# Patient Record
Sex: Female | Born: 1950
Health system: Southern US, Community
[De-identification: ages and names within clinical notes are randomized; demographics above are authoritative.]

## PROBLEM LIST (undated history)

## (undated) DIAGNOSIS — E11319 Type 2 diabetes mellitus with unspecified diabetic retinopathy without macular edema: Secondary | ICD-10-CM

## (undated) DIAGNOSIS — S82899A Other fracture of unspecified lower leg, initial encounter for closed fracture: Secondary | ICD-10-CM

## (undated) DIAGNOSIS — H4312 Vitreous hemorrhage, left eye: Secondary | ICD-10-CM

## (undated) DIAGNOSIS — Z973 Presence of spectacles and contact lenses: Secondary | ICD-10-CM

## (undated) DIAGNOSIS — I10 Essential (primary) hypertension: Secondary | ICD-10-CM

## (undated) DIAGNOSIS — E039 Hypothyroidism, unspecified: Secondary | ICD-10-CM

## (undated) DIAGNOSIS — F17200 Nicotine dependence, unspecified, uncomplicated: Secondary | ICD-10-CM

## (undated) DIAGNOSIS — Z8249 Family history of ischemic heart disease and other diseases of the circulatory system: Secondary | ICD-10-CM

## (undated) DIAGNOSIS — M1990 Unspecified osteoarthritis, unspecified site: Secondary | ICD-10-CM

## (undated) DIAGNOSIS — K219 Gastro-esophageal reflux disease without esophagitis: Secondary | ICD-10-CM

## (undated) DIAGNOSIS — I493 Ventricular premature depolarization: Secondary | ICD-10-CM

## (undated) DIAGNOSIS — Z8489 Family history of other specified conditions: Secondary | ICD-10-CM

## (undated) DIAGNOSIS — T8484XA Pain due to internal orthopedic prosthetic devices, implants and grafts, initial encounter: Secondary | ICD-10-CM

## (undated) DIAGNOSIS — R55 Syncope and collapse: Secondary | ICD-10-CM

## (undated) DIAGNOSIS — H35039 Hypertensive retinopathy, unspecified eye: Secondary | ICD-10-CM

## (undated) HISTORY — PX: CATARACT EXTRACTION: SUR2

## (undated) HISTORY — DX: Hypertensive retinopathy, unspecified eye: H35.039

## (undated) HISTORY — DX: Family history of ischemic heart disease and other diseases of the circulatory system: Z82.49

## (undated) HISTORY — PX: CATARACT EXTRACTION W/ INTRAOCULAR LENS  IMPLANT, BILATERAL: SHX1307

## (undated) HISTORY — DX: Ventricular premature depolarization: I49.3

## (undated) HISTORY — DX: Type 2 diabetes mellitus with unspecified diabetic retinopathy without macular edema: E11.319

## (undated) HISTORY — DX: Unspecified osteoarthritis, unspecified site: M19.90

## (undated) HISTORY — DX: Essential (primary) hypertension: I10

## (undated) HISTORY — DX: Nicotine dependence, unspecified, uncomplicated: F17.200

## (undated) HISTORY — DX: Syncope and collapse: R55

## (undated) HISTORY — PX: EYE SURGERY: SHX253

---

## 1999-09-24 ENCOUNTER — Encounter: Admission: RE | Admit: 1999-09-24 | Discharge: 1999-09-24 | Payer: Self-pay | Admitting: Family Medicine

## 1999-09-24 ENCOUNTER — Encounter: Payer: Self-pay | Admitting: Family Medicine

## 1999-11-02 ENCOUNTER — Other Ambulatory Visit: Admission: RE | Admit: 1999-11-02 | Discharge: 1999-11-02 | Payer: Self-pay | Admitting: *Deleted

## 2003-10-24 ENCOUNTER — Encounter: Admission: RE | Admit: 2003-10-24 | Discharge: 2003-10-24 | Payer: Self-pay | Admitting: Internal Medicine

## 2003-12-30 ENCOUNTER — Encounter: Admission: RE | Admit: 2003-12-30 | Discharge: 2003-12-30 | Payer: Self-pay | Admitting: Internal Medicine

## 2004-01-30 ENCOUNTER — Encounter: Admission: RE | Admit: 2004-01-30 | Discharge: 2004-01-30 | Payer: Self-pay | Admitting: Internal Medicine

## 2004-02-03 ENCOUNTER — Encounter (INDEPENDENT_AMBULATORY_CARE_PROVIDER_SITE_OTHER): Payer: Self-pay | Admitting: Internal Medicine

## 2004-02-03 ENCOUNTER — Encounter: Admission: RE | Admit: 2004-02-03 | Discharge: 2004-02-03 | Payer: Self-pay | Admitting: Internal Medicine

## 2004-02-20 ENCOUNTER — Ambulatory Visit (HOSPITAL_COMMUNITY): Admission: RE | Admit: 2004-02-20 | Discharge: 2004-02-20 | Payer: Self-pay | Admitting: Internal Medicine

## 2004-02-20 ENCOUNTER — Encounter (INDEPENDENT_AMBULATORY_CARE_PROVIDER_SITE_OTHER): Payer: Self-pay | Admitting: Internal Medicine

## 2004-03-02 ENCOUNTER — Encounter: Admission: RE | Admit: 2004-03-02 | Discharge: 2004-03-02 | Payer: Self-pay | Admitting: Internal Medicine

## 2004-05-21 ENCOUNTER — Ambulatory Visit: Payer: Self-pay | Admitting: Internal Medicine

## 2004-07-12 LAB — CONVERTED CEMR LAB: Pap Smear: NORMAL

## 2004-08-18 ENCOUNTER — Ambulatory Visit: Payer: Self-pay | Admitting: Internal Medicine

## 2004-09-16 ENCOUNTER — Ambulatory Visit: Payer: Self-pay | Admitting: Internal Medicine

## 2005-01-11 ENCOUNTER — Ambulatory Visit: Payer: Self-pay | Admitting: Internal Medicine

## 2005-02-13 ENCOUNTER — Emergency Department (HOSPITAL_COMMUNITY): Admission: EM | Admit: 2005-02-13 | Discharge: 2005-02-13 | Payer: Self-pay | Admitting: Emergency Medicine

## 2005-06-16 ENCOUNTER — Ambulatory Visit: Payer: Self-pay | Admitting: Internal Medicine

## 2005-06-23 ENCOUNTER — Ambulatory Visit: Payer: Self-pay | Admitting: Internal Medicine

## 2005-07-07 ENCOUNTER — Emergency Department (HOSPITAL_COMMUNITY): Admission: EM | Admit: 2005-07-07 | Discharge: 2005-07-07 | Payer: Self-pay | Admitting: Family Medicine

## 2005-07-09 ENCOUNTER — Ambulatory Visit: Payer: Self-pay | Admitting: Internal Medicine

## 2005-07-12 HISTORY — PX: CARDIAC CATHETERIZATION: SHX172

## 2005-07-23 ENCOUNTER — Ambulatory Visit: Payer: Self-pay | Admitting: Internal Medicine

## 2005-08-20 ENCOUNTER — Ambulatory Visit: Payer: Self-pay | Admitting: Internal Medicine

## 2005-08-20 ENCOUNTER — Encounter (INDEPENDENT_AMBULATORY_CARE_PROVIDER_SITE_OTHER): Payer: Self-pay | Admitting: Internal Medicine

## 2005-09-22 ENCOUNTER — Emergency Department (HOSPITAL_COMMUNITY): Admission: EM | Admit: 2005-09-22 | Discharge: 2005-09-22 | Payer: Self-pay | Admitting: Emergency Medicine

## 2005-09-29 ENCOUNTER — Ambulatory Visit: Payer: Self-pay | Admitting: Hospitalist

## 2005-10-13 ENCOUNTER — Ambulatory Visit: Payer: Self-pay | Admitting: Hospitalist

## 2006-01-11 ENCOUNTER — Ambulatory Visit: Payer: Self-pay | Admitting: Hospitalist

## 2006-01-11 ENCOUNTER — Encounter: Admission: RE | Admit: 2006-01-11 | Discharge: 2006-01-11 | Payer: Self-pay | Admitting: Hospitalist

## 2006-01-11 ENCOUNTER — Inpatient Hospital Stay (HOSPITAL_COMMUNITY): Admission: AD | Admit: 2006-01-11 | Discharge: 2006-01-13 | Payer: Self-pay | Admitting: Internal Medicine

## 2006-01-11 ENCOUNTER — Encounter (INDEPENDENT_AMBULATORY_CARE_PROVIDER_SITE_OTHER): Payer: Self-pay | Admitting: Cardiology

## 2006-01-11 ENCOUNTER — Ambulatory Visit: Payer: Self-pay | Admitting: Internal Medicine

## 2006-01-13 ENCOUNTER — Encounter: Payer: Self-pay | Admitting: Vascular Surgery

## 2006-02-02 ENCOUNTER — Ambulatory Visit: Payer: Self-pay | Admitting: Hospitalist

## 2006-02-07 ENCOUNTER — Ambulatory Visit (HOSPITAL_COMMUNITY): Admission: RE | Admit: 2006-02-07 | Discharge: 2006-02-07 | Payer: Self-pay | Admitting: Internal Medicine

## 2006-02-17 ENCOUNTER — Ambulatory Visit: Payer: Self-pay | Admitting: Internal Medicine

## 2006-04-30 DIAGNOSIS — E119 Type 2 diabetes mellitus without complications: Secondary | ICD-10-CM | POA: Insufficient documentation

## 2006-04-30 DIAGNOSIS — I10 Essential (primary) hypertension: Secondary | ICD-10-CM

## 2006-04-30 DIAGNOSIS — G5600 Carpal tunnel syndrome, unspecified upper limb: Secondary | ICD-10-CM | POA: Insufficient documentation

## 2006-04-30 DIAGNOSIS — Z87891 Personal history of nicotine dependence: Secondary | ICD-10-CM | POA: Insufficient documentation

## 2006-08-25 ENCOUNTER — Ambulatory Visit: Payer: Self-pay | Admitting: Internal Medicine

## 2006-08-25 LAB — CONVERTED CEMR LAB
Blood Glucose, Fingerstick: 213
Hgb A1c MFr Bld: 8.3 %

## 2006-09-05 ENCOUNTER — Encounter (INDEPENDENT_AMBULATORY_CARE_PROVIDER_SITE_OTHER): Payer: Self-pay | Admitting: *Deleted

## 2006-09-05 LAB — CONVERTED CEMR LAB
Creatinine, Urine: 153.5 mg/dL
Microalb Creat Ratio: 16.4 mg/g (ref 0.0–30.0)
Microalb, Ur: 2.51 mg/dL — ABNORMAL HIGH (ref 0.00–1.89)

## 2006-09-07 ENCOUNTER — Ambulatory Visit: Payer: Self-pay | Admitting: Hospitalist

## 2006-09-07 ENCOUNTER — Ambulatory Visit: Payer: Self-pay | Admitting: Internal Medicine

## 2006-09-07 LAB — CONVERTED CEMR LAB: Blood Glucose, Fingerstick: 113

## 2006-12-20 ENCOUNTER — Ambulatory Visit: Payer: Self-pay | Admitting: Internal Medicine

## 2006-12-20 ENCOUNTER — Encounter (INDEPENDENT_AMBULATORY_CARE_PROVIDER_SITE_OTHER): Payer: Self-pay | Admitting: Internal Medicine

## 2006-12-20 LAB — CONVERTED CEMR LAB: Blood Glucose, Fingerstick: 204

## 2006-12-21 ENCOUNTER — Encounter (INDEPENDENT_AMBULATORY_CARE_PROVIDER_SITE_OTHER): Payer: Self-pay | Admitting: Internal Medicine

## 2006-12-21 LAB — CONVERTED CEMR LAB
Albumin: 4.7 g/dL (ref 3.5–5.2)
BUN: 22 mg/dL (ref 6–23)
Calcium: 9.7 mg/dL (ref 8.4–10.5)
Chloride: 102 meq/L (ref 96–112)
Glucose, Bld: 177 mg/dL — ABNORMAL HIGH (ref 70–99)
Hemoglobin: 16.1 g/dL — ABNORMAL HIGH (ref 12.0–15.0)
Potassium: 4.6 meq/L (ref 3.5–5.3)
RBC: 5.26 M/uL — ABNORMAL HIGH (ref 3.87–5.11)
Sodium: 138 meq/L (ref 135–145)
Total Protein: 7.4 g/dL (ref 6.0–8.3)
WBC: 6.3 10*3/uL (ref 4.0–10.5)

## 2007-01-02 ENCOUNTER — Ambulatory Visit (HOSPITAL_COMMUNITY): Admission: RE | Admit: 2007-01-02 | Discharge: 2007-01-02 | Payer: Self-pay | Admitting: Sports Medicine

## 2007-01-04 ENCOUNTER — Encounter: Admission: RE | Admit: 2007-01-04 | Discharge: 2007-01-04 | Payer: Self-pay | Admitting: Internal Medicine

## 2007-03-30 ENCOUNTER — Ambulatory Visit: Payer: Self-pay | Admitting: Infectious Diseases

## 2007-03-30 ENCOUNTER — Observation Stay (HOSPITAL_COMMUNITY): Admission: EM | Admit: 2007-03-30 | Discharge: 2007-03-30 | Payer: Self-pay | Admitting: Emergency Medicine

## 2007-04-07 ENCOUNTER — Ambulatory Visit: Payer: Self-pay | Admitting: Internal Medicine

## 2007-04-07 ENCOUNTER — Encounter (INDEPENDENT_AMBULATORY_CARE_PROVIDER_SITE_OTHER): Payer: Self-pay | Admitting: *Deleted

## 2007-04-07 LAB — CONVERTED CEMR LAB
Calcium: 9.9 mg/dL (ref 8.4–10.5)
Creatinine, Ser: 0.76 mg/dL (ref 0.40–1.20)
Glucose, Bld: 181 mg/dL — ABNORMAL HIGH (ref 70–99)
Hgb A1c MFr Bld: 7.7 %
Sodium: 140 meq/L (ref 135–145)

## 2007-04-12 ENCOUNTER — Ambulatory Visit: Payer: Self-pay | Admitting: Internal Medicine

## 2007-04-12 ENCOUNTER — Encounter (INDEPENDENT_AMBULATORY_CARE_PROVIDER_SITE_OTHER): Payer: Self-pay | Admitting: *Deleted

## 2007-04-12 LAB — CONVERTED CEMR LAB
CO2: 24 meq/L (ref 19–32)
Calcium: 9.6 mg/dL (ref 8.4–10.5)
Chloride: 103 meq/L (ref 96–112)
Creatinine, Ser: 0.8 mg/dL (ref 0.40–1.20)
Sodium: 138 meq/L (ref 135–145)

## 2007-06-02 ENCOUNTER — Ambulatory Visit: Payer: Self-pay | Admitting: Internal Medicine

## 2007-08-21 ENCOUNTER — Encounter (INDEPENDENT_AMBULATORY_CARE_PROVIDER_SITE_OTHER): Payer: Self-pay | Admitting: Internal Medicine

## 2007-11-01 ENCOUNTER — Encounter (INDEPENDENT_AMBULATORY_CARE_PROVIDER_SITE_OTHER): Payer: Self-pay | Admitting: Internal Medicine

## 2007-11-01 ENCOUNTER — Ambulatory Visit: Payer: Self-pay | Admitting: Hospitalist

## 2007-11-01 LAB — CONVERTED CEMR LAB
BUN: 15 mg/dL (ref 6–23)
Chloride: 101 meq/L (ref 96–112)
Potassium: 4.6 meq/L (ref 3.5–5.3)
Sodium: 137 meq/L (ref 135–145)

## 2007-12-22 ENCOUNTER — Encounter (INDEPENDENT_AMBULATORY_CARE_PROVIDER_SITE_OTHER): Payer: Self-pay | Admitting: Internal Medicine

## 2007-12-22 ENCOUNTER — Ambulatory Visit (HOSPITAL_COMMUNITY): Admission: RE | Admit: 2007-12-22 | Discharge: 2007-12-22 | Payer: Self-pay | Admitting: Internal Medicine

## 2007-12-22 ENCOUNTER — Ambulatory Visit: Payer: Self-pay | Admitting: Internal Medicine

## 2007-12-22 LAB — CONVERTED CEMR LAB
Blood Glucose, Fingerstick: 122
CO2: 28 meq/L (ref 19–32)
Calcium: 9.7 mg/dL (ref 8.4–10.5)
Chloride: 102 meq/L (ref 96–112)
Glucose, Bld: 150 mg/dL — ABNORMAL HIGH (ref 70–99)
Sodium: 140 meq/L (ref 135–145)

## 2008-01-03 ENCOUNTER — Ambulatory Visit (HOSPITAL_COMMUNITY): Admission: RE | Admit: 2008-01-03 | Discharge: 2008-01-03 | Payer: Self-pay | Admitting: Internal Medicine

## 2008-01-10 LAB — HM DIABETES EYE EXAM: HM Diabetic Eye Exam: NORMAL

## 2008-04-08 ENCOUNTER — Encounter: Payer: Self-pay | Admitting: Internal Medicine

## 2008-04-10 ENCOUNTER — Ambulatory Visit (HOSPITAL_COMMUNITY): Admission: RE | Admit: 2008-04-10 | Discharge: 2008-04-10 | Payer: Self-pay | Admitting: Orthopedic Surgery

## 2008-04-24 ENCOUNTER — Encounter: Admission: RE | Admit: 2008-04-24 | Discharge: 2008-06-26 | Payer: Self-pay | Admitting: Orthopedic Surgery

## 2008-08-06 ENCOUNTER — Ambulatory Visit: Payer: Self-pay | Admitting: Family Medicine

## 2008-08-06 DIAGNOSIS — M7500 Adhesive capsulitis of unspecified shoulder: Secondary | ICD-10-CM

## 2008-08-06 DIAGNOSIS — E785 Hyperlipidemia, unspecified: Secondary | ICD-10-CM

## 2008-08-06 LAB — HM COLONOSCOPY

## 2008-08-07 ENCOUNTER — Telehealth: Payer: Self-pay | Admitting: Family Medicine

## 2008-08-07 LAB — CONVERTED CEMR LAB
Creatinine,U: 174.6 mg/dL
Hgb A1c MFr Bld: 8.8 % — ABNORMAL HIGH (ref 4.6–6.0)
Microalb Creat Ratio: 11.5 mg/g (ref 0.0–30.0)
Microalb, Ur: 2 mg/dL — ABNORMAL HIGH (ref 0.0–1.9)

## 2008-08-09 ENCOUNTER — Other Ambulatory Visit: Admission: RE | Admit: 2008-08-09 | Discharge: 2008-08-09 | Payer: Self-pay | Admitting: Family Medicine

## 2008-08-09 ENCOUNTER — Encounter: Payer: Self-pay | Admitting: Family Medicine

## 2008-08-09 ENCOUNTER — Ambulatory Visit: Payer: Self-pay | Admitting: Family Medicine

## 2008-08-13 ENCOUNTER — Encounter (INDEPENDENT_AMBULATORY_CARE_PROVIDER_SITE_OTHER): Payer: Self-pay | Admitting: *Deleted

## 2008-09-11 ENCOUNTER — Ambulatory Visit: Payer: Self-pay | Admitting: Family Medicine

## 2008-09-11 ENCOUNTER — Encounter (INDEPENDENT_AMBULATORY_CARE_PROVIDER_SITE_OTHER): Payer: Self-pay | Admitting: *Deleted

## 2008-09-11 LAB — CONVERTED CEMR LAB: OCCULT 1: NEGATIVE

## 2008-09-16 IMAGING — MG MM DIGITAL SCREENING BILAT W/ CAD
4 series · 4 of 4 positions shown · non-contrast
Comparison: Prior studies.

DG SCREEN MAMMOGRAM BILATERAL
Bilateral CC and MLO view(s) were taken.
Technologist: Hiciano, Masako.(DI BU)(M)

DIGITAL SCREENING MAMMOGRAM WITH CAD:

[R CC]
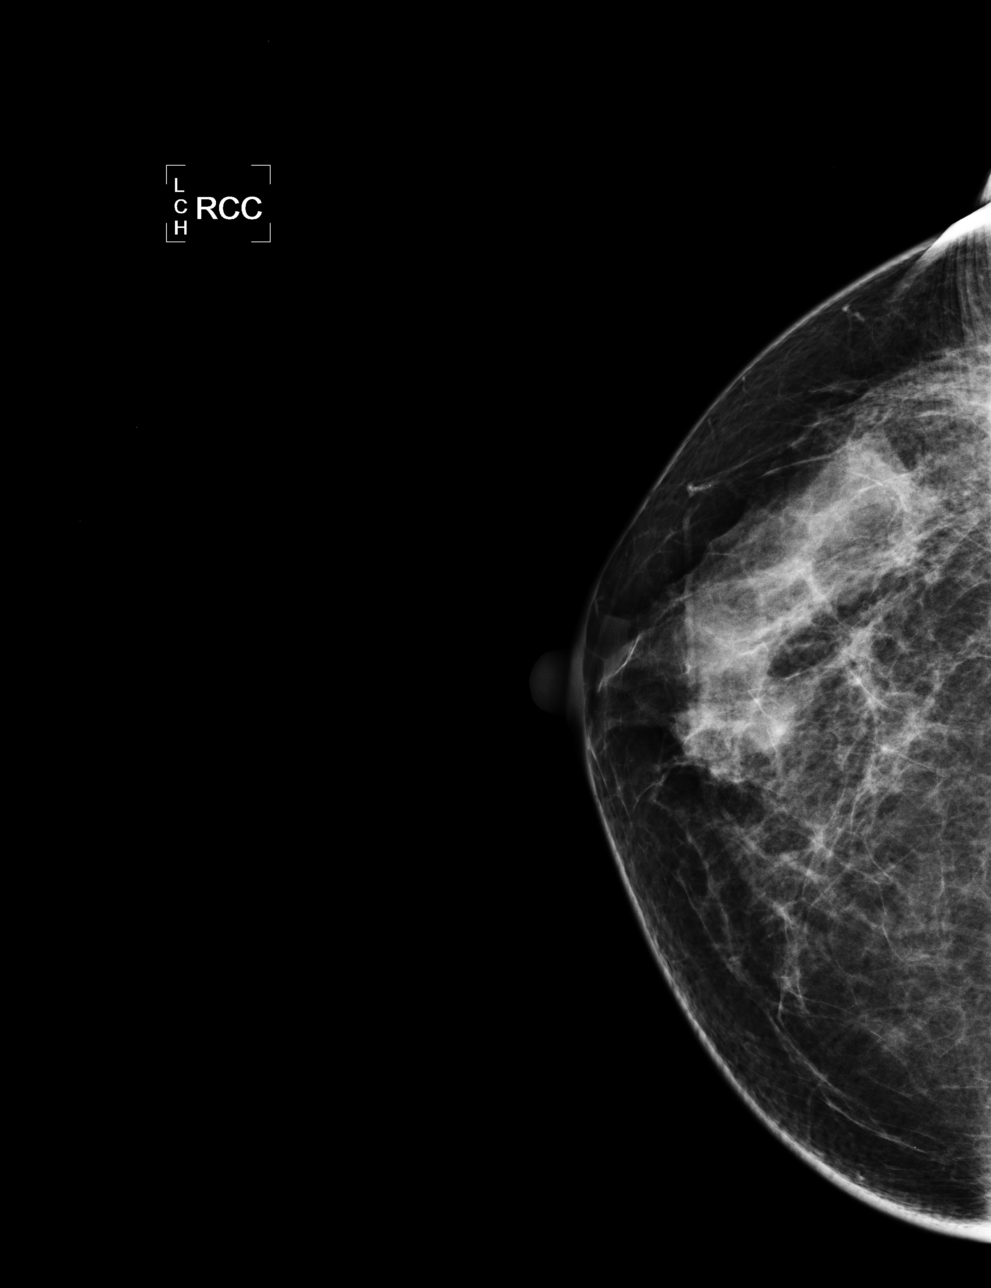

[R MLO]
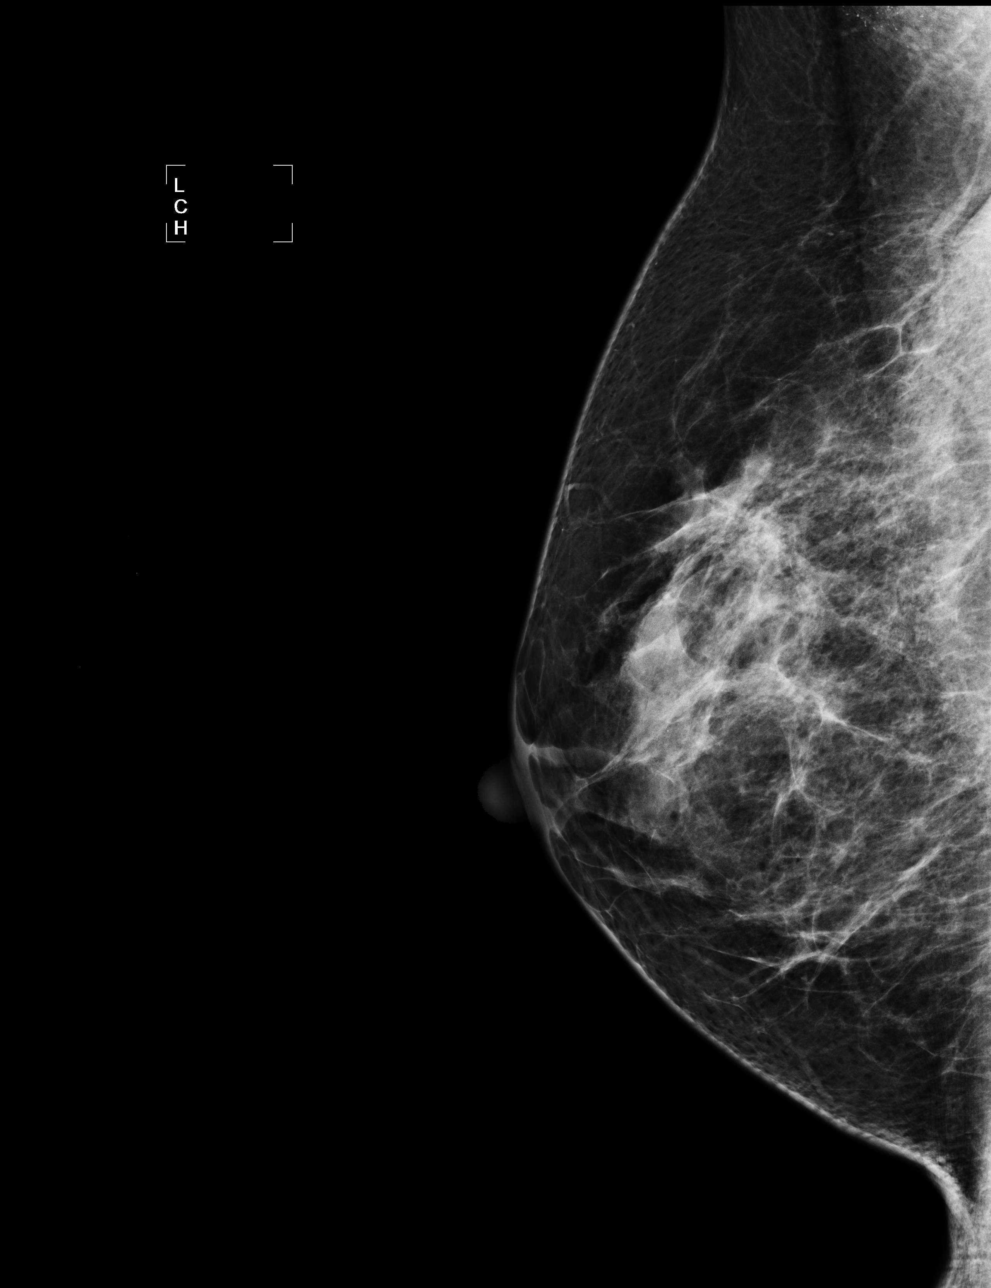

[L CC]
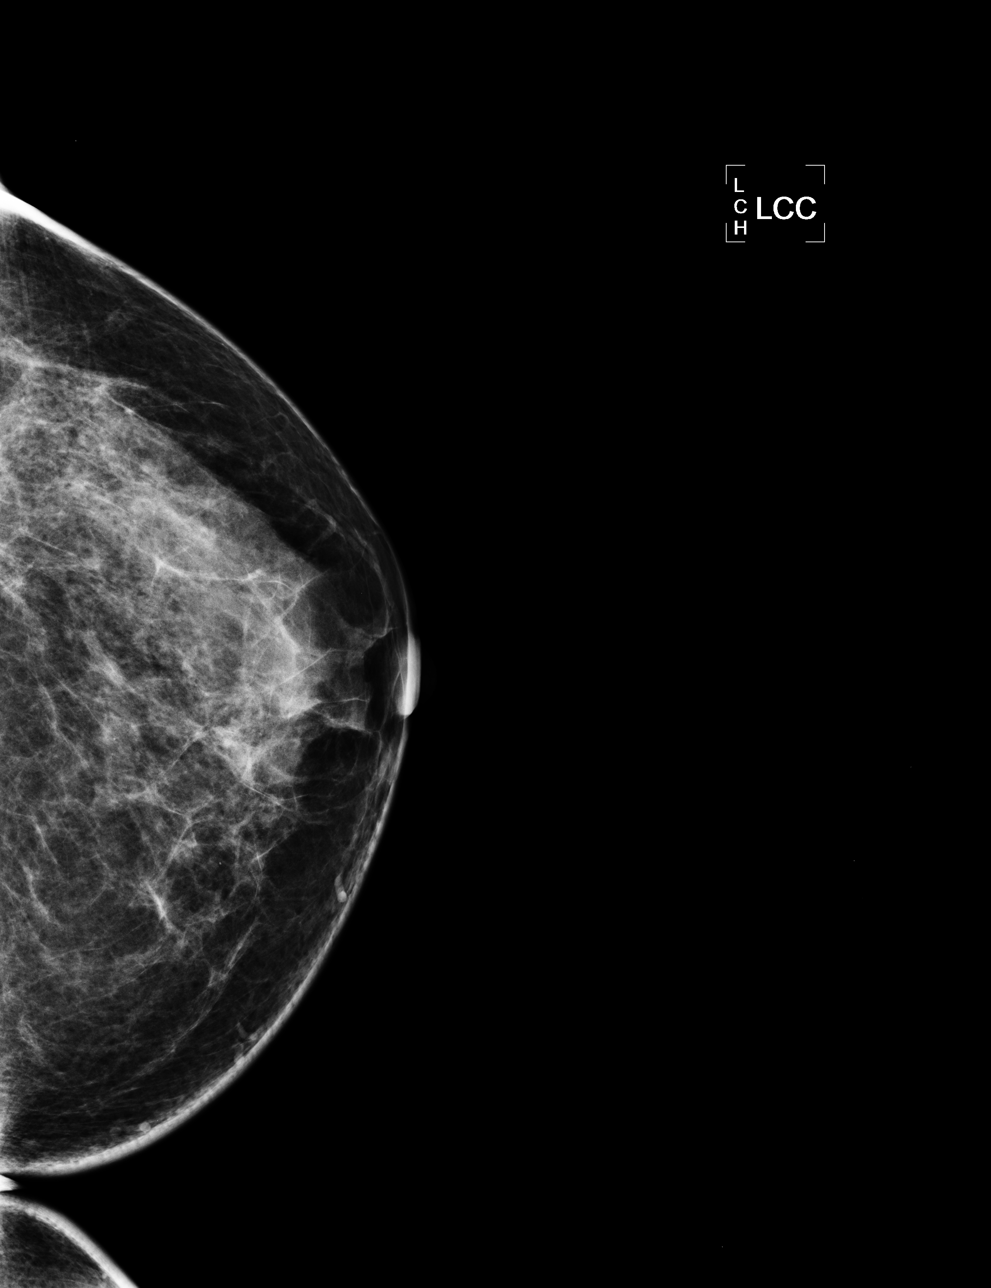

[L MLO]
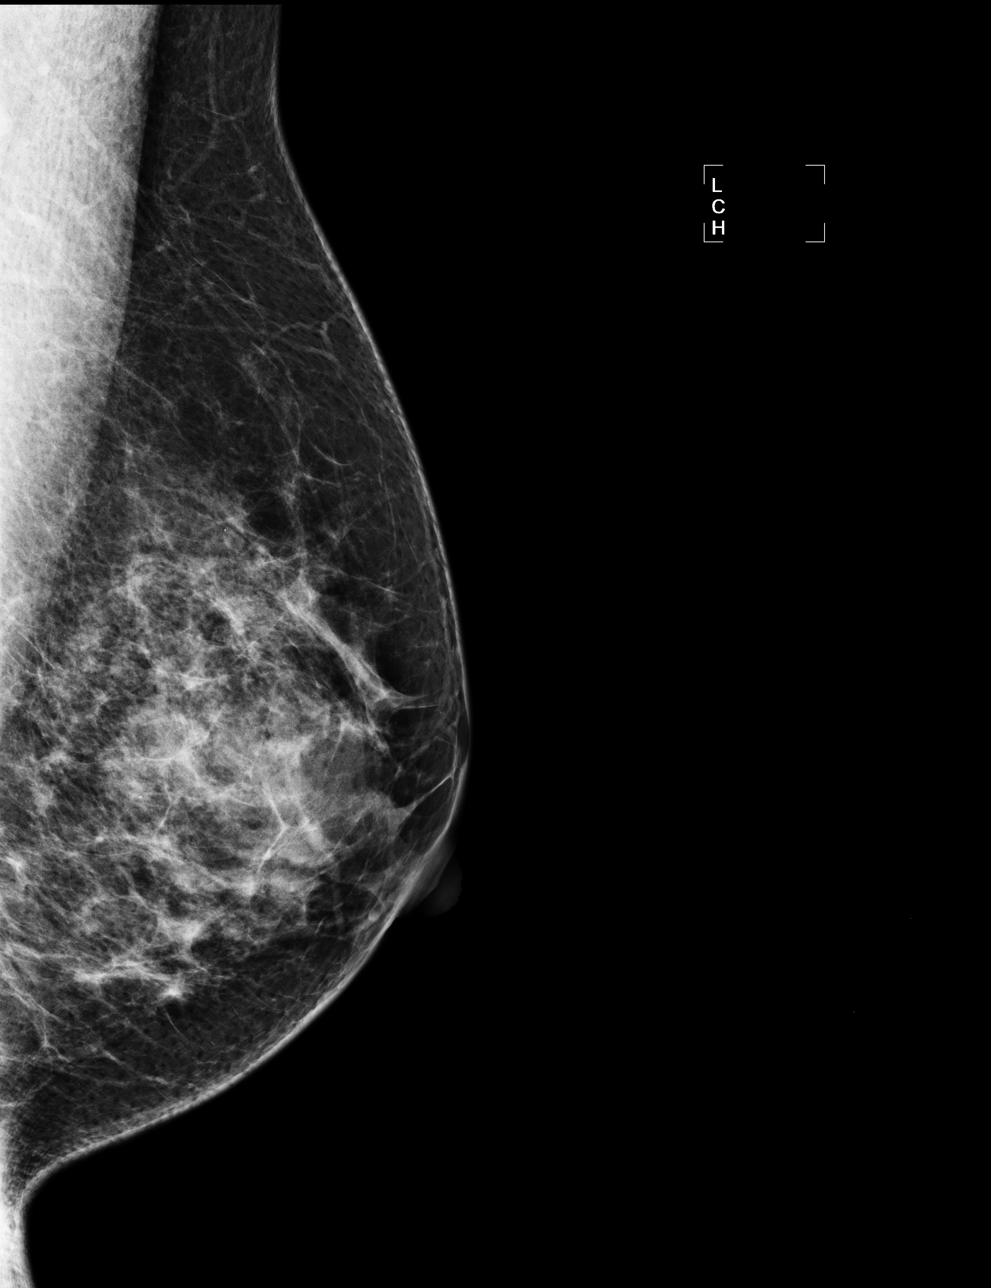

[4 of 4 positions shown; findings below may reference images not displayed]

The breast tissue is heterogeneously dense.  There is no dominant mass, architectural distortion or
calcification to suggest malignancy.
IMPRESSION: No mammographic evidence of malignancy.  Suggest yearly screening mammography.

ASSESSMENT: Negative - BI-RADS 1

Screening mammogram in 1 year.
ANALYZED BY COMPUTER AIDED DETECTION. , THIS PROCEDURE WAS A DIGITAL MAMMOGRAM.

## 2008-10-29 ENCOUNTER — Ambulatory Visit: Payer: Self-pay | Admitting: Family Medicine

## 2008-11-01 LAB — CONVERTED CEMR LAB
Albumin: 4.4 g/dL (ref 3.5–5.2)
Alkaline Phosphatase: 68 units/L (ref 39–117)
BUN: 15 mg/dL (ref 6–23)
CO2: 31 meq/L (ref 19–32)
Calcium: 9.8 mg/dL (ref 8.4–10.5)
Cholesterol: 101 mg/dL (ref 0–200)
Creatinine, Ser: 0.7 mg/dL (ref 0.4–1.2)
GFR calc non Af Amer: 91.19 mL/min (ref >60)
Glucose, Bld: 166 mg/dL — ABNORMAL HIGH (ref 70–99)
HDL: 34.1 mg/dL — ABNORMAL LOW (ref >39.00)
Hgb A1c MFr Bld: 7.9 % — ABNORMAL HIGH (ref 4.6–6.5)
Microalb, Ur: 1.7 mg/dL (ref 0.0–1.9)
Sodium: 143 meq/L (ref 135–145)
Total Protein: 7.5 g/dL (ref 6.0–8.3)
VLDL: 13.8 mg/dL (ref 0.0–40.0)

## 2008-11-05 ENCOUNTER — Ambulatory Visit: Payer: Self-pay | Admitting: Family Medicine

## 2008-11-05 LAB — CONVERTED CEMR LAB
Cholesterol, target level: 200 mg/dL
LDL Goal: 100 mg/dL

## 2009-01-30 ENCOUNTER — Ambulatory Visit: Payer: Self-pay | Admitting: Family Medicine

## 2009-02-03 LAB — CONVERTED CEMR LAB
BUN: 13 mg/dL (ref 6–23)
Calcium: 9.5 mg/dL (ref 8.4–10.5)
GFR calc non Af Amer: 91.11 mL/min (ref >60)
Glucose, Bld: 136 mg/dL — ABNORMAL HIGH (ref 70–99)
Hgb A1c MFr Bld: 8.4 % — ABNORMAL HIGH (ref 4.6–6.5)
Potassium: 4.6 meq/L (ref 3.5–5.1)
Sodium: 142 meq/L (ref 135–145)

## 2009-02-04 ENCOUNTER — Ambulatory Visit: Payer: Self-pay | Admitting: Family Medicine

## 2009-02-05 ENCOUNTER — Ambulatory Visit: Payer: Self-pay | Admitting: Internal Medicine

## 2009-02-05 ENCOUNTER — Encounter: Payer: Self-pay | Admitting: Internal Medicine

## 2009-02-05 LAB — CONVERTED CEMR LAB
Gardnerella vaginalis: NEGATIVE
Hemoglobin, Urine: NEGATIVE
Ketones, ur: NEGATIVE mg/dL
Protein, ur: NEGATIVE mg/dL
RBC / HPF: NONE SEEN (ref <3)
Urine Glucose: >1000 mg/dL — AB
pH: 5 (ref 5.0–8.0)

## 2009-02-06 ENCOUNTER — Encounter: Payer: Self-pay | Admitting: Internal Medicine

## 2009-02-24 ENCOUNTER — Telehealth: Payer: Self-pay | Admitting: *Deleted

## 2009-05-05 ENCOUNTER — Ambulatory Visit: Payer: Self-pay | Admitting: Family Medicine

## 2009-05-07 LAB — CONVERTED CEMR LAB
BUN: 14 mg/dL (ref 6–23)
CO2: 30 meq/L (ref 19–32)
Calcium: 10 mg/dL (ref 8.4–10.5)
Chloride: 104 meq/L (ref 96–112)
Creatinine, Ser: 0.7 mg/dL (ref 0.4–1.2)
GFR calc non Af Amer: 91.03 mL/min (ref >60)
Glucose, Bld: 128 mg/dL — ABNORMAL HIGH (ref 70–99)
Hgb A1c MFr Bld: 7.8 % — ABNORMAL HIGH (ref 4.6–6.5)
Potassium: 4.2 meq/L (ref 3.5–5.1)
Sodium: 144 meq/L (ref 135–145)

## 2009-05-08 ENCOUNTER — Ambulatory Visit: Payer: Self-pay | Admitting: Family Medicine

## 2009-05-30 ENCOUNTER — Telehealth: Payer: Self-pay | Admitting: Family Medicine

## 2009-06-04 ENCOUNTER — Encounter: Payer: Self-pay | Admitting: Family Medicine

## 2009-07-30 ENCOUNTER — Encounter (INDEPENDENT_AMBULATORY_CARE_PROVIDER_SITE_OTHER): Payer: Self-pay | Admitting: *Deleted

## 2009-08-29 ENCOUNTER — Ambulatory Visit: Payer: Self-pay | Admitting: Family Medicine

## 2009-08-29 LAB — CONVERTED CEMR LAB: Hgb A1c MFr Bld: 7.8 % — ABNORMAL HIGH (ref 4.6–6.5)

## 2009-11-07 ENCOUNTER — Ambulatory Visit: Payer: Self-pay | Admitting: Family Medicine

## 2009-11-07 LAB — HM DIABETES FOOT EXAM

## 2009-11-24 ENCOUNTER — Telehealth: Payer: Self-pay | Admitting: Family Medicine

## 2010-01-28 ENCOUNTER — Encounter: Payer: Self-pay | Admitting: Family Medicine

## 2010-04-01 ENCOUNTER — Encounter (INDEPENDENT_AMBULATORY_CARE_PROVIDER_SITE_OTHER): Payer: Self-pay | Admitting: *Deleted

## 2010-04-08 ENCOUNTER — Ambulatory Visit: Payer: Self-pay | Admitting: Family Medicine

## 2010-08-11 NOTE — Letter (Signed)
Summary: [place] No Show Letter  [place] at [facility]  [address] [place], [place] [address]   Phone: [phone]  Fax: [phone]    04/01/2010 MRN: [id number]  Jamie Mays [address] [place], [place]  [address]   Dear Ms. Yetta Barre,   Our records indicate that you missed your scheduled appointment with ______lab______________ on __9.21.11__________.  Please contact this office to reschedule your appointment as soon as possible.  It is important that you keep your scheduled appointments with your physician, so we can provide you the best care possible.  Please be advised that there may be a charge for "no show" appointments.    Sincerely,    at [facility]

## 2010-08-11 NOTE — Letter (Signed)
Summary: Med Link  Med Link   Imported By: Lanelle Bal 02/04/2010 08:39:01  _____________________________________________________________________  External Attachment:    Type:   Image     Comment:   External Document

## 2010-08-11 NOTE — Assessment & Plan Note (Signed)
Summary: CHECK UP / LFW   Vital Signs:  Patient profile:   60 year old female Height:      66 inches Weight:      166.8 pounds BMI:     27.02 Temp:     98.4 degrees F oral Pulse rate:   76 / minute Pulse rhythm:   regular BP sitting:   120 / 60  (left arm) Cuff size:   regular  Vitals Entered By: Benny Lennert CMA Duncan Dull) (November 07, 2009 2:29 PM)  History of Present Illness: Chief complaint 3 month follow up  DM, on insulin pump..remains inadequately controlled.  FBS: 120-240 post prandials 175-190. Jamison Neighbor, clinical nutritionist and DM educator.Marland Kitchenadjusting the pump. Remains on metformin...no side effects of this medicaiton.  Hypertension History:      Well controlled at homeOcc high under stress at work 155/85.        Positive major cardiovascular risk factors include female age 28 years old or older, diabetes, hyperlipidemia, and hypertension.  Negative major cardiovascular risk factors include non-tobacco-user status.     Problems Prior to Update: 1)  Special Screening Malig Neoplasms Other Sites  (ICD-V76.49) 2)  Routine Gynecological Examination  (ICD-V72.31) 3)  Well Woman  (ICD-V70.0) 4)  Hyperlipidemia  (ICD-272.4) 5)  Family History of Cad Female 1st Degree Relative <60  (ICD-V16.49) 6)  Frozen Right Shoulder  (ICD-726.0) 7)  Health Maintenance Exam  (ICD-V70.0) 8)  Hx of Medication Intolerance To Avandia  () 9)  Fh of Myocardial Infarction  () 10)  Tobacco Use, Quit  (ICD-V15.82) 11)  Diabetes Mellitus  (ICD-250.00) 12)  Carpal Tunnel Syndrome  (ICD-354.0) 13)  Hypertension  (ICD-401.9)  Allergies: 1)  ! * Lisinopril  Past History:  Past medical, surgical, family and social histories (including risk factors) reviewed, and no changes noted (except as noted below).  Past Medical History: Reviewed history from 08/06/2008 and no changes required. Hypertension DM Tobacco abuse Carpal tunnel syndrome Family Hx of MI Syncope PVCs  Past  Surgical History: Reviewed history from 08/06/2008 and no changes required. cath 2007 neg ECHO EF nml 2007 1981 C-section  Family History: Reviewed history from 08/06/2008 and no changes required. father: healthy mother: DM, CAD age 66, kidney failure Family History of CAD Female 1st degree relative <60 siblings: DM PGF" Hodgkin's dz  Social History: Reviewed history from 08/06/2008 and no changes required. Occupation: [employer] Married Former Smoker 10 pack year history Alcohol use-yes, rare Drug use-no Regular exercise-yes Diet: fruits and veggies, but doesn't stick to DM diet  Review of Systems General:  Denies fatigue and fever. CV:  Denies chest pain or discomfort. Resp:  Denies shortness of breath. GI:  Denies abdominal pain, bloody stools, constipation, and diarrhea. GU:  Denies dysuria.  Physical Exam  General:  Well-developed,well-nourished,in no acute distress; alert,appropriate and cooperative throughout examination Mouth:  MMM Neck:  no carotid bruit or thyromegaly no cervical or supraclavicular lymphadenopathy  Lungs:  Normal respiratory effort, chest expands symmetrically. Lungs are clear to auscultation, no crackles or wheezes. Heart:  Normal rate and regular rhythm. S1 and S2 normal without gallop, murmur, click, rub or other extra sounds. Abdomen:  Bowel sounds positive,abdomen soft and non-tender without masses, organomegaly or hernias noted. Pulses:  R and L posterior tibial pulses are full and equal bilaterally  Extremities:  no edmea  Diabetes Management Exam:    Foot Exam (with socks and/or shoes not present):       Sensory-Pinprick/Light touch:  Left medial foot (L-4): normal          Left dorsal foot (L-5): normal          Left lateral foot (S-1): normal          Right medial foot (L-4): normal          Right dorsal foot (L-5): normal          Right lateral foot (S-1): normal       Sensory-Monofilament:           Left foot: normal          Right foot: normal       Inspection:          Left foot: normal          Right foot: normal       Nails:          Left foot: normal          Right foot: normal   Impression & Recommendations:  Problem # 1:  DIABETES MELLITUS (ICD-250.00) Inadequate control..max pout metformin at 2 tabs two times a day. Recehck A1C in 3 months. Continue adjustment of insulin pump per specialist. Her updated medication list for this problem includes:    Metformin Hcl 500 Mg Tabs (Metformin hcl) .Marland Kitchen... Take 2 tablet by mouth two times a day    Cozaar 100 Mg Tabs (Losartan potassium) .Marland Kitchen... Take 1 tablet by mouth once a day generic    Aspirin 81 Mg Tabs (Aspirin) .Marland Kitchen... Take 1 tablet by mouth once a day    Novolog 100 Unit/ml Soln (Insulin aspart) .Marland Kitchen... As needed if insulin pump fails  Problem # 2:  HYPERTENSION (ICD-401.9)  Moderate control..only high if under stress at work Continue current medication.  If persistantly elevated at work..can consider increasing HCTZ to 25 mg daily.  Her updated medication list for this problem includes:    Cozaar 100 Mg Tabs (Losartan potassium) .Marland Kitchen... Take 1 tablet by mouth once a day generic    Hydrochlorothiazide 12.5 Mg Tabs (Hydrochlorothiazide) .Marland Kitchen... Take 1 tablet by mouth once a day  BP today: 120/60 Prior BP: 150/80 (05/08/2009)  Prior 10 Yr Risk Heart Disease: 24 % (02/04/2009)  Labs Reviewed: K+: 4.2 (05/05/2009) Creat: : 0.7 (05/05/2009)   Chol: 101 (10/29/2008)   HDL: 34.10 (10/29/2008)   LDL: 53 (10/29/2008)   TG: 69.0 (10/29/2008)  Complete Medication List: 1)  Metformin Hcl 500 Mg Tabs (Metformin hcl) .... Take 2 tablet by mouth two times a day 2)  Cozaar 100 Mg Tabs (Losartan potassium) .... Take 1 tablet by mouth once a day generic 3)  Antivert 25 Mg Tabs (Meclizine hcl) .... Take one tablet three times a day. 4)  Hydrochlorothiazide 12.5 Mg Tabs (Hydrochlorothiazide) .... Take 1 tablet by mouth once a day 5)   Multivitamins Tabs (Multiple vitamin) .... Take 1 tablet by mouth once a day 6)  Aspirin 81 Mg Tabs (Aspirin) .... Take 1 tablet by mouth once a day 7)  Calcium 600/vitamin D 600-400 Mg-unit Tabs (Calcium carbonate-vitamin d) .Marland Kitchen.. 1 tab by mouth daily 8)  Accu-chek Aviva Strp (Glucose blood) .... Check blood sugar 6 times daily  dx 250.01 9)  Lancets Misc (Lancets) .... Check blood sugar 3 times daily dx 250.00 10)  Novolog 100 Unit/ml Soln (Insulin aspart) .... As needed if insulin pump fails  Hypertension Assessment/Plan:      The patient's hypertensive risk group is category C: Target organ damage and/or diabetes.  Her calculated 10 year risk of coronary heart disease is 24 %.  Today's blood pressure is 120/60.  Her blood pressure goal is < 130/80.  Patient Instructions: 1)  INcrease metformin to 2 tab by mouth two times a day. 2)  Scheduled CPX in 3 months with labs prior. 3)  Fasting lipids, CMET, A1C, microalbumin Dx 250.00, cbc, TSH, vit B12 Dx 780.79 Prescriptions: METFORMIN HCL 500 MG TABS (METFORMIN HCL) Take 2 tablet by mouth two times a day  #120 x 11   Entered and Authorized by:   Kerby Nora MD   Signed by:   Kerby Nora MD on 11/07/2009   Method used:   Electronically to        [facility]* (retail)       [address]       [address] Shipping/mailing       [place], [place]  [address]       Ph: [phone]       Fax: [phone]   RxID:   [id number] NOVOLOG 100 UNIT/ML SOLN (INSULIN ASPART) as needed if insulin pump fails  #1 bottle x 11   Entered and Authorized by:   Kerby Nora MD   Signed by:   Kerby Nora MD on 11/07/2009   Method used:   Electronically to        [facility]* (retail)       [address]       [address] Shipping/mailing       [place], [place]  [address]       Ph: [phone]       Fax: [phone]   RxID:   [id number]   Current Allergies (reviewed today): ! * LISINOPRIL

## 2010-08-11 NOTE — Letter (Signed)
Summary: [place] No Show Letter  [place] at [facility]  [address] [place], [place] [address]   Phone: [phone]  Fax: [phone]    07/30/2009 MRN: [id number]  Jamie Mays [address] [place], [place]  [address]   Dear [name],   Our records indicate that you missed your scheduled appointment with ___lab__________________ on _1.19.11___________.  Please contact this office to reschedule your appointment as soon as possible.  It is important that you keep your scheduled appointments with your physician, so we can provide you the best care possible.  Please be advised that there may be a charge for "no show" appointments.    Sincerely,   [place] at [facility]

## 2010-08-11 NOTE — Progress Notes (Signed)
Summary: refill request for lantus  Phone Note Refill Request Message from:  [employer]  Refills Requested: Medication #1:  lantus Electronic refill request from [facility].  Chart says pt is no longer on lantus, is on novolog.  Please advise.  Initial call taken by: Lowella Petties CMA,  Nov 24, 2009 11:11 AM  Follow-up for Phone Call        cAll pt does she need this for her insulin pump? I believe answer is no.  Follow-up by: Kerby Nora MD,  Nov 24, 2009 11:25 AM  Additional Follow-up for Phone Call Additional follow up Details #1::        Called pt, no answer or machine.           Lowella Petties CMA  Nov 24, 2009 11:42 AM     Additional Follow-up for Phone Call Additional follow up Details #2::    patient has not returned phone call and drug store has not requested medication any more will try again if any more fax come through Follow-up by: Benny Lennert CMA Duncan Dull),  Nov 27, 2009 8:31 AM

## 2010-09-11 ENCOUNTER — Other Ambulatory Visit (HOSPITAL_COMMUNITY): Payer: Self-pay | Admitting: Gastroenterology

## 2010-09-16 ENCOUNTER — Ambulatory Visit (HOSPITAL_COMMUNITY)
Admission: RE | Admit: 2010-09-16 | Discharge: 2010-09-16 | Disposition: A | Discharge status: Home / Self Care | Payer: 59 | Source: Ambulatory Visit | Attending: Gastroenterology | Admitting: Gastroenterology

## 2010-09-16 DIAGNOSIS — K7689 Other specified diseases of liver: Secondary | ICD-10-CM | POA: Insufficient documentation

## 2010-09-29 ENCOUNTER — Other Ambulatory Visit: Payer: Self-pay | Admitting: Dermatology

## 2010-11-16 ENCOUNTER — Other Ambulatory Visit: Payer: Self-pay | Admitting: *Deleted

## 2010-11-16 MED ORDER — INSULIN ASPART PROT & ASPART (70-30 MIX) 100 UNIT/ML ~~LOC~~ SUSP: SUBCUTANEOUS | Status: DC

## 2010-11-27 NOTE — Discharge Summary (Signed)
Jamie Mays, Jamie Mays                ACCOUNT NO.:  [id number]   MEDICAL RECORD NO.:  [id number]          PATIENT TYPE:  OBV   LOCATION:  5705                         FACILITY:  [facility]   PHYSICIAN:  Fransisco Hertz, M.D.  DATE OF BIRTH:  September 12, 1950   DATE OF ADMISSION:  03/29/2007  DATE OF DISCHARGE:  03/30/2007                               DISCHARGE SUMMARY   ATTENDING:  Fransisco Hertz, M.D.   DISCHARGE DIAGNOSES:  1. Hypoglycemia due to accidental overdose of subcutaneous insulin.  2. Diabetes type 2 with last hemoglobin A1c of 7.4 on June 2008.  3. Hypertension.  4. History of carpal tunnel syndrome.   DISCHARGE MEDICATIONS:  1. Lantus 65 units q.h.s.  2. NovoLog sliding-scale insulin 3 times a daily.  3. HCTZ 12.5 mg p.o. daily.  4. Metformin 500 mg p.o. twice daily.  5. Cozaar 50 mg p.o. daily.   DISPOSITION/FOLLOWUP:  The patient will follow up at the outpatient  clinic of internal medicine at [facility].  To followup with her primary  care doctor who is Dr. Peggye Pitt.  She will call [phone] to make  this appointment and will follow up at her own discretion in the next 2  to 3 weeks.   PROCEDURE PERFORMED:  None.   CONSULTATIONS:  None.   BRIEF HISTORY:  This is a 60 year old female with a history of type 2  diabetes and hypertension who accidentally gave herself 65 units of  NovoLog instead of her nightly 65 units of Lantus this p.m.  She  realized immediately and came to the ED.  On the way to the ED she began  to feel sweaty, lightheaded, started to eat candy with hopes of  resolving her symptoms.  On presentation her CBG was 268.  She had no  other complaints at the time of discharge.   PHYSICAL EXAMINATION:  VITAL SIGNS:  On admission temperature 98.7,  blood pressure 134/78, pulse 105, respirations 18, saturating 96% on  room air.  CBG 268 which fell to 234.  GENERAL:  The patient is alert, pleasant in no active distress.  HEENT:  Pupils equally  reactive, nonicteric sclerae.  Extraocular eye  movements are intact.  Oropharynx exam there are no exudates, erythema  or swelling.  Oropharynx.  NECK:  There is no cervical lymphadenopathy found.  RESPIRATORY:  Lungs are clear bilaterally with good air movement.  CARDIOVASCULAR:  Regular rate and rhythm.  No murmurs, rubs or gallops.  ABDOMEN:  Bowel sounds present.  Abdomen is soft, nontender,  nondistended.  EXTREMITIES:  No edema.  No cyanosis or clubbing. Muscle strength +5  muscle strength in all extremities.  NEUROLOGIC:  The patient's cranial nerves II-XII are intact. There are  no focal deficits detected.  PSYCH:  The patient has a euthymic mood.   LABORATORY DATA:  Sodium 138, potassium 3.6, chloride 103, bicarb 27,  BUN 18, creatinine 0.7, glucose 146, white count 7.8, hemoglobin 15.4,  platelets 268, AST 87.9, calcium 9.7.   HOSPITAL COURSE:  1. Accidental overdose with 65 units of NovoLog instead of Lantus.  The patient mistakenly gave herself the wrong type of insulin      tonight.  She recognized her mistake; during the transit from her      home to the hospital, began to feel symptoms apparently symptoms of      hypoglycemia.  She began to feel shaky, weak like and lightheaded      and immediately began to consume candy.  Her blood glucoses on      admission actually indicated hyperglycemia with CBGs of 260.  The      patient was admitted for 23-hour observation just to be sure that      her blood glucoses remained stable overnight.  The patient did not      have any complaints overnight.  She did not have any shakiness,      clamminess or weakness.  Her blood glucoses maintained between 217      to 430.  The next morning we felt  comfortable sending the patient      home given that her blood glucoses had not fallen.  We placed the      patient back on her normal course of sliding-scale insulin and      Lantus.  She will follow up with her primary care physician  at her      own discretion at the outpatient clinic of internal medicine at      [facility].  2. Hypertension.  The patient's blood pressures were maintained      between 130/70 over 139/73 during the course of her admission.  She      is currently taking HCTZ and it appears to be well controlled.  We      do not feel that she needs any additional antihypertensives at this      time.  Her goal systolic blood pressure is under 130 and if she      continues to be higher than 130 at multiple visits with her primary      care doctor she might need additional antihypertensive therapy.   Discharge vitas:  Temperature 98.3, systolic blood pressure 130/70,  pulse 69, respirations 20, saturating 97 to 98% on room air.  CBGs 217  to 430.   Discharge labs:  Sodium 132, potassium 5.1, chloride 106, bicarb 28, BUN  12, creatinine 0.70, glucose 254.      Lollie Sails, MD       Fransisco Hertz, M.D.     CB/MEDQ  D:  04/25/2007  T:  04/26/2007  Job:  [id number]

## 2010-11-27 NOTE — Cardiovascular Report (Signed)
NAMEELSEY, HOLTS                ACCOUNT NO.:  [id number]   MEDICAL RECORD NO.:  [id number]          PATIENT TYPE:  INP   LOCATION:  3732                         FACILITY:  [facility]   PHYSICIAN:  Cristy Hilts. Jacinto Halim, MD       DATE OF BIRTH:  1951-04-12   DATE OF PROCEDURE:  01/13/2006  DATE OF DISCHARGE:                              CARDIAC CATHETERIZATION   DATE OF PROCEDURE:  01/13/2006.   PROCEDURE:  1.  Left ventriculography.  2.  Selective right and left coronary angiography.  3.  Abdominal aortogram.  4.  Right femoral angiography and closure of right femoral arterial access      with StarClose.   INDICATIONS FOR PROCEDURE:  Jamie Mays is a 60 year old female with a  history of diabetes, hypertension, who was admitted to the hospital with two  episodes of syncope.  She also has bradycardia.  She does have significant  cardiovascular risk factors.  Given this, she was brought to the cardiac  catheterization lab for definitive diagnosis of coronary disease.   Abdominal aortogram was performed to evaluate for abdominal atherosclerosis  and aortic stenosis.   HEMODYNAMIC DATA:  The left ventricular pressure of 131/2 with  end-  diastolic pressure of 11 mmHg.  The aortic pressures were 130/70 with a mean  of 98 mmHg.  There was no pressure gradient across the aortic valve.   ANGIOGRAPHIC DATA:  Left ventricle:  Left ventricular systolic function was  normal with ejection fraction of 65%.  There was no significant mitral  regurgitation.   Right coronary artery:  The right coronary artery is a moderate to large  caliber vessel.  It is tortuous, but otherwise continues as a large PDA.  It  is codominant with  circumflex pulmonary artery.   Left main:  Left main is very short and immediately bifurcates into LAD and  circumflex.   Circumflex:  Circumflex is large caliber vessel.  It continues as a large  OM1 after giving a distal AV groove branch and also small PDA branches.  It  is smooth and normal.   LAD:  LAD is a large caliber vessel.  Gives origin to several small  diagonals.  It is mildly tortuous.  Otherwise, it is normal.   Abdominal aortogram:  Abdominal aortogram revealed presence of 2 renal  arteries, one on either side.  .  They are widely patent.   FINDINGS:  1.  The patient with normal left ventricular systolic function, ejection      fraction of 65%.  2.  Normal coronary arteries, right and left core dominant systems.  3.  Normal abdominal aorta and normal renal arteries.   RECOMMENDATIONS:  The patient can potentially be discharged home if the  echocardiogram is normal unless she has recurrent syncope.  No further  evaluation is indicated.  However, if she does have recurrent symptoms of  syncope, consider a tilt table testing.   A total of 85 mL of contrast was utilized for diagnostic angiography.   TECHNIQUE OF PROCEDURE:  .  Under the usual sterile precautions, using a 6  French right femoral arterial access, 6 [place] multipurpose B2 catheter was  advanced into the ascending aorta over 0.025 inch J wire.  The catheter was  gently advanced into the left ventricle and left ventricular pressure  monitored.  Hand contrast injection of left ventricle was performed, both in  LAO and RAO projections.  Catheter was flushed with saline, pulled back into  the ascending aorta and pressure gradient across the aorta valve was  monitored.  Right coronary artery was selectively engaged and angiography  was performed.  Then left main coronary artery selectively engaged and  angiography was performed.  Then the catheter was pulled in the abdominal  aorta and abdominal aortogram was performed.  Right femoral angiography was  performed through the arterial access sheath and the access was closed with  StarClose with excellent hemostasis.  The patient tolerated the procedure  well.  No immediate complications noted.      Cristy Hilts. Jacinto Halim, MD   Electronically Signed     JRG/MEDQ  D:  01/13/2006  T:  01/13/2006  Job:  [id number]   cc:   Madaline Guthrie, M.D.  Fax: [phone]   Thereasa Solo, M.D.  Fax: [phone]

## 2010-11-27 NOTE — Discharge Summary (Signed)
Jamie Mays, Jamie Mays                ACCOUNT NO.:  [id number]   MEDICAL RECORD NO.:  [id number]          PATIENT TYPE:  INP   LOCATION:  3732                         FACILITY:  [facility]   PHYSICIAN:  Thereasa Solo, M.D.  DATE OF BIRTH:  08-01-1950   DATE OF ADMISSION:  01/11/2006  DATE OF DISCHARGE:  01/13/2006                                 DISCHARGE SUMMARY   DISCHARGE DIAGNOSES:  Main complaint 2 near syncopal episodes.   OTHER DIAGNOSES:  1.  Chronic-diabetes type 1 insulin dependent.  2.  Hypertension.   DISCHARGE MEDICATIONS:  1.  Paxil 20 mg p.o. once a day, preferably at bedtime.  2.  Lantus 65 units before bed which the patient was taking before she      arrived as well as NovoLog sliding scale insulin by the patient's home      chart.   CONDITION ON DISCHARGE:  Guarded.  The patient will be contacted by staff  here at [facility] for a followup date.  The patient usually follows up with  primary care physician Dr. Guy Begin, however she will be absent for the next 3  months.   PROCEDURES WHILE IN THE HOSPITAL:  1.  Cardiac echo.  2.  Carotid duplex.  3.  Cardiac catheterization.   CONSULTATIONS:  The patient was consulted by the cardiology staff here  including Dr. Elsie Lincoln.   BRIEF ADMITTING HISTORY AND PHYSICAL:  The patient is a 60 year old white  female who presented to the outpatient clinic with complaints of  lightheadedness or near syncopal events x2 over the past 2 weeks.  She  described the episodes as a feeling of lightheadedness or feeling as if she  was about to faint.  The patient denies any palpitations, diaphoresis, or  vertigo.  The patient also denies chest pain and shortness of breath and  loss of consciousness.  During these episodes which lasted around 20  minutes, the patient was able to check her blood sugar which remained indeed  140s approximately.  She did try to calculate her heart rate which she  believes was around 45 during the episodes.  She  also describes feeling  lethargic.   ADMISSION LABS:  CBC: White blood cell count 5.4, hemoglobin 15.4,  hematocrit 44.8, platelets 209.  Sodium 141, potassium 3.8, chloride 105,  CO2 28, BUN 12, creatinine 0.7, glucose 179.  AST 49, ALT 68, alk phos 79,  total bilirubin 1.1, albumin 4.2, calcium 9.4.  Troponin 0.01, CK-MB 1.4,  TSH 0.83, PT 14, INR 1.1, PTT 28.   HOSPITAL COURSE:  The patient's main problem was the 2 near syncopal events  that she had over the last 2 weeks, each lasting up to 20 minutes.  First we  considered cardiac causes including possible MI, arrhythmia, or transient  bradycardia.  For cardiac workup, the patient was placed on a regular bed  with telemetry.  She was scheduled for echocardiogram, the results of which  were normal.  Cardiac enzymes were normal and repeat EKGs were all normal.  The patient was also sent for carotid duplex which returned normal  with no  evidence of plaque.  The patient subsequently after cardiology consultation  was taken for cardiac catheterization the results of which were normal.  Also for her near syncopal events, we considered the fact that she is a type  1 diabetic.  However, during both of the episodes, the patient was able to  check her blood sugars which remained between 140 and 200.  Therefore  excluding the cause of hypoglycemia.  We also considered the fact that the  patient did have a history of panic attacks in the past however she has not  had them for several years.  The autonomic functions during the panic  attacks and subsequent vasovagal rebound could have produced similar  symptoms to the patient.  We also considered the fact that the patient could  have a type of orthostatic hypotension.  However, orthostatic blood  pressures were within normal limits.  It should be noted that on subsequent  followup visits, if the patient is still having these near syncopal events  that it could help to better diagnose the patient  if she was given a tilt  tablet test.  We also considered the fact that the patient did have a  history of hypertension which she is not taking any medications for.  However, during the patient's entire stay, her blood pressure was within  normal limits and we decided to discharge her without any hypertensive  medication.   Also during the patient's stay it was noted that she elevated liver  transaminases.  Subsequently, we sent away for a hepatitis B and hepatitis C  panel, the results of which are pending at the time of discharge.   Also during the patient's stay, she complained of a minor sinus fullness or  congestion.  For this we prescribed an Afrin nasal spray which did help  relieve the sinus congestion.  We opted not to go for a systemic  decongestant because this could alter her heart rhythm which we were  studying.   In summary, at the time of discharge, the patient had been largely ruled out  for any cardiac cause during her syncopal events.  We decided that they may  be due to a neuro cardiac effect in relation to the patient's possible  anxiety.   DISCHARGE LABS AND VITALS:  Temperature 98.4, blood pressure 132/68, pulse  79, respirations 14.  Oxygen saturation 96.  CBG 153.  White blood cell  count 5.0.  Hemoglobin 15.8, hematocrit 44.4, platelets 196, sodium 140,  potassium 4.1, chloride 104, CO2 30, BUN 12, creatinine 0.8, glucose 121.  PT 14.1, INR 1.1, PTT 30.  AST 51, ALT 68.  Alk phos 72, total bilirubin  1.2, albumin 4.0, calcium 9.2.   PENDING LABS:  Hepatitis B core antigen pending, hepatitis C antibody  pending.      Thereasa Solo, M.D.  Electronically Signed     AS/MEDQ  D:  01/13/2006  T:  01/13/2006  Job:  [id number]   cc:   Madaline Savage, M.D.  Fax: [phone]

## 2011-01-08 ENCOUNTER — Other Ambulatory Visit: Payer: Self-pay | Admitting: Family Medicine

## 2011-01-08 NOTE — Telephone Encounter (Signed)
Only gave patient # 30 with 0 refills called patient and left message on machine for her to return my call to schedule physical before any other refill

## 2011-01-11 NOTE — Telephone Encounter (Signed)
Patient advised and patient scheduled appt for cpx later this month

## 2011-01-22 ENCOUNTER — Encounter: Payer: Self-pay | Admitting: Family Medicine

## 2011-01-22 ENCOUNTER — Ambulatory Visit (INDEPENDENT_AMBULATORY_CARE_PROVIDER_SITE_OTHER): Payer: 59 | Admitting: Family Medicine

## 2011-01-22 VITALS — BP 152/78 | HR 80 | Ht 66.5 in | Wt 165.0 lb

## 2011-01-22 DIAGNOSIS — M7500 Adhesive capsulitis of unspecified shoulder: Secondary | ICD-10-CM

## 2011-01-22 DIAGNOSIS — M25552 Pain in left hip: Secondary | ICD-10-CM | POA: Insufficient documentation

## 2011-01-22 DIAGNOSIS — M25559 Pain in unspecified hip: Secondary | ICD-10-CM

## 2011-01-22 MED ORDER — NAPROXEN 500 MG PO TABS: 500.0000 mg | ORAL_TABLET | Freq: Two times a day (BID) | ORAL | Status: DC

## 2011-01-22 NOTE — Progress Notes (Signed)
  Subjective:    Patient ID: Jamie Mays, female    DOB: 06/29/1951, 60 y.o.   MRN: [id number]  HPI  #1. Left shoulder pain for many months. Similar to the symptoms she had with previously diagnosed right frozen shoulder. She is right-hand dominant. Unclear why this has been bothering her as there's been no specific injury. She is having difficulty raising her hand much above shoulder level and coverage behind her back.  #2. Left hip and knee pain that has been gradual in onset. Worse when she's doing a lot of walking.  Review of Systems     Objective:   Physical Exam    GENERAL: Well-developed female no acute distress Shoulder without obvious defect. Full strength in:              Internal rotation              External rotation               Supraspinatus testing . Range of motion decreased. External rotation and is only 40 and internal rotation to about 20. She can place her left hand and leg to the lateral portion of her hip bother her and she can place to T12. She can AB..DU CT to about 90 laterally and 75 in forward flexion. \  Distally neurovasculalry intact Impingement signs are Negative   HIPS: She lacks about 5 of internal rotation on either side and has pretty full external rotation. She has some mild pain with the extremes of her internal rotation. Trendelenburg is negative. Hip flexor strength bilaterally is normal. There is no pain in the sciatic notch or over the SI joint. Pearlean Brownie test is very slightly positive on the left negative on the right. I think this is more from stretching of the IT band and treat SI joint dysfunction. Leg length discrepancy in that her left leg is a 1/2 cm shorter than her right. GAIT: Very slight shortening of the stride length on the left leg. Otherwise is a normal gait. \ INJECTION: Patient was given informed consent, signed copy in the chart. Appropriate time out was taken. Area prepped and draped in usual sterile fashion. 1 cc of  kenalog plus  9 cc of lidocaine was injected into the ( in divided doses : 5 cc of mixture into each) thel eft subacromial bursa and left GH joint using a(n) posterior approach. The patient tolerated the procedure well. There were no complications. Post procedure instructions were given.    Assessment & Plan:   . Number one. Frozen shoulder beginning on the left. We will start her on the rotator cuff exercises. Today we discussed options including corticosteroid injection. Initially she was a little concerned because she does have diabetes but her last A1c was less than 8. Ultimately we decided to go for the injection I will see her back in 3-4 weeks. She may need serial injections we'll see how she does with the home exercise program. #2. Left hip pain. I think this is multifactorial including probably some of her leg length discrepancy causing some IT band issues. We'll put her on a program and evaluate her again I see her back. I opted not to change the leg length discrepancy with a pad at this time.

## 2011-01-29 ENCOUNTER — Ambulatory Visit: Payer: 59 | Admitting: Family Medicine

## 2011-02-01 ENCOUNTER — Other Ambulatory Visit: Payer: Self-pay | Admitting: *Deleted

## 2011-02-01 MED ORDER — DICLOFENAC SODIUM 75 MG PO TBEC: 75.0000 mg | DELAYED_RELEASE_TABLET | Freq: Two times a day (BID) | ORAL | Status: DC

## 2011-02-01 NOTE — Progress Notes (Signed)
Pt called stating the naproxen Dr. Jennette Kettle rx'd made her have leg cramps and dizziness.  Per dr. Darrick Penna Diclofenac rx sent to replace the naproxen.

## 2011-02-02 ENCOUNTER — Encounter: Payer: Self-pay | Admitting: Family Medicine

## 2011-02-04 ENCOUNTER — Ambulatory Visit (INDEPENDENT_AMBULATORY_CARE_PROVIDER_SITE_OTHER): Payer: 59 | Admitting: Family Medicine

## 2011-02-04 ENCOUNTER — Encounter: Payer: Self-pay | Admitting: Family Medicine

## 2011-02-04 ENCOUNTER — Encounter: Payer: Commercial Managed Care - PPO | Admitting: Family Medicine

## 2011-02-04 ENCOUNTER — Other Ambulatory Visit (HOSPITAL_COMMUNITY)
Admission: RE | Admit: 2011-02-04 | Discharge: 2011-02-04 | Disposition: A | Discharge status: Home / Self Care | Payer: 59 | Source: Ambulatory Visit | Attending: Family Medicine | Admitting: Family Medicine

## 2011-02-04 ENCOUNTER — Encounter: Payer: Self-pay | Admitting: Radiology

## 2011-02-04 DIAGNOSIS — N950 Postmenopausal bleeding: Secondary | ICD-10-CM

## 2011-02-04 DIAGNOSIS — Z1231 Encounter for screening mammogram for malignant neoplasm of breast: Secondary | ICD-10-CM

## 2011-02-04 DIAGNOSIS — Z01419 Encounter for gynecological examination (general) (routine) without abnormal findings: Secondary | ICD-10-CM

## 2011-02-04 DIAGNOSIS — E119 Type 2 diabetes mellitus without complications: Secondary | ICD-10-CM

## 2011-02-04 DIAGNOSIS — Z780 Asymptomatic menopausal state: Secondary | ICD-10-CM

## 2011-02-04 DIAGNOSIS — I10 Essential (primary) hypertension: Secondary | ICD-10-CM

## 2011-02-04 DIAGNOSIS — K7689 Other specified diseases of liver: Secondary | ICD-10-CM

## 2011-02-04 DIAGNOSIS — K760 Fatty (change of) liver, not elsewhere classified: Secondary | ICD-10-CM

## 2011-02-04 DIAGNOSIS — Z1159 Encounter for screening for other viral diseases: Secondary | ICD-10-CM | POA: Insufficient documentation

## 2011-02-04 DIAGNOSIS — E785 Hyperlipidemia, unspecified: Secondary | ICD-10-CM

## 2011-02-04 NOTE — Patient Instructions (Addendum)
Continue to follow BP closely. Restart Cozaar if BP >130/80 Consider shingles vaccine.Marland KitchenMarland KitchenLook into insurance. Stop and schedule mammograma nd bone density.  GO to lab for stool cards.

## 2011-02-04 NOTE — Assessment & Plan Note (Signed)
Referral to GYN to rule out endomettrial cancer with endometrial biopsy. DIscussed in detail with pt.

## 2011-02-04 NOTE — Assessment & Plan Note (Signed)
Per ENDO. On insulin pump.  Moderate control.  pt wishes to change ENDO given does not feel comfortable with Dr. Chestine Spore.

## 2011-02-04 NOTE — Assessment & Plan Note (Signed)
WEll controlled per pt last check at work. Pt will bring records.

## 2011-02-04 NOTE — Progress Notes (Signed)
Subjective:    Patient ID: Jamie Mays, female    DOB: [birth year], 60 y.o.   MRN: [id number]  HPI  The patient is here for annual wellness exam and preventative care.    Hypertension:  Well controlled on HCTZ. Stopped Cozaar.  Using medication without problems or lightheadedness:  Chest pain with exertion: None Edema:None Short of breath:None Average home BPs: 115/42, lightheadhed so stopped Cozaar. Other issues:  Diabetes: On insulin pump, glucophage Lab Results  Component Value Date   HGBA1C 7.8* 08/29/2009  Last A1C at ENDO was 8.2.. Has adjusted insulin.  Has seen Dr. Chestine Spore.. But having issues with his care.. Plans on changing to Dr. Sharl Ma. Using medications without difficulties: Hypoglycemic episodes: OCc Hyperglycemic episodes:Occ Feet problems:None eye exam within last year:Yes Elevated Cholesterol: Total chol 115 at work, LDL <100, HDL low though.  She continues to exercise. Left shoulder pain improving after joint injection 2 weeks ago.   Review of Systems  Constitutional: Negative for fever and fatigue.  HENT: Negative for ear pain.   Eyes: Negative for pain.  Respiratory: Negative for chest tightness and shortness of breath.   Cardiovascular: Negative for chest pain, palpitations and leg swelling.  Gastrointestinal: Negative for abdominal pain.  Genitourinary: Positive for vaginal bleeding. Negative for dysuria, urgency, vaginal discharge, vaginal pain and pelvic pain.       Postmenopausal bleeding       Objective:   Physical Exam  Constitutional: Vital signs are normal. She appears well-developed and well-nourished. She is cooperative.  Non-toxic appearance. She does not appear ill. No distress.  HENT:  Head: Normocephalic.  Right Ear: Hearing, tympanic membrane, external ear and ear canal normal.  Left Ear: Hearing, tympanic membrane, external ear and ear canal normal.  Nose: Nose normal.  Eyes: Conjunctivae, EOM and lids are normal. Pupils are equal,  round, and reactive to light. No foreign bodies found.  Neck: Trachea normal and normal range of motion. Neck supple. Carotid bruit is not present. No mass and no thyromegaly present.  Cardiovascular: Normal rate, regular rhythm, S1 normal, S2 normal, normal heart sounds and intact distal pulses.  Exam reveals no gallop.   No murmur heard. Pulmonary/Chest: Effort normal and breath sounds normal. No respiratory distress. She has no wheezes. She has no rhonchi. She has no rales.  Abdominal: Soft. Normal appearance and bowel sounds are normal. She exhibits no distension, no fluid wave, no abdominal bruit and no mass. There is no hepatosplenomegaly. There is no tenderness. There is no rebound, no guarding and no CVA tenderness. No hernia.  Genitourinary: Uterus normal. No breast swelling, tenderness, discharge or bleeding. Pelvic exam was performed with patient prone. There is no rash, tenderness or lesion on the right labia. There is no rash, tenderness or lesion on the left labia. Uterus is not enlarged and not tender. Cervix exhibits no motion tenderness, no discharge and no friability. Right adnexum displays no mass, no tenderness and no fullness. Left adnexum displays no mass, no tenderness and no fullness. There is bleeding around the vagina.       Blood in vaginal vault and small cervical polyp nonbleeding.  Lymphadenopathy:    She has no cervical adenopathy.    She has no axillary adenopathy.  Neurological: She is alert. She has normal strength. No cranial nerve deficit or sensory deficit.  Skin: Skin is warm, dry and intact. No rash noted.  Psychiatric: Her speech is normal and behavior is normal. Judgment normal. Her mood appears not anxious. Cognition  and memory are normal. She does not exhibit a depressed mood.      Diabetic foot exam: Normal inspection No skin breakdown No calluses  Normal DP pulses Normal sensation to light touch and monofilament Nails normal     Assessment &  Plan:  Complete Physical Exam The patient's preventative maintenance and recommended screening tests for an annual wellness exam were reviewed in full today. Brought up to date unless services declined.  Counselled on the importance of diet, exercise, and its role in overall health and mortality. The patient's FH and SH was reviewed, including their home life, tobacco status, and drug and alcohol status.   Schedule mammo and DXA Consider shingles, otherwise UTD vaccines Pap this year then space every 2 years, DVE yearly Stool cards for colon cancer screening.

## 2011-02-04 NOTE — Assessment & Plan Note (Signed)
Well controlled. Continue current medication. Follow closely off cozaar to assure not increasing.

## 2011-02-10 ENCOUNTER — Telehealth: Payer: Self-pay | Admitting: *Deleted

## 2011-02-10 MED ORDER — CYCLOBENZAPRINE HCL 5 MG PO TABS: ORAL_TABLET | ORAL | Status: DC

## 2011-02-10 NOTE — Telephone Encounter (Signed)
Pt states she has been having severe leg cramps at night that is causing her to have problems sleeping.  States the cramps are intense at times with twitching muscles afterwards, and aching at other times.  She has been eating a lot of bananas.  Concerned the shoulder injection could have caused the leg cramping.

## 2011-02-12 ENCOUNTER — Encounter: Payer: Self-pay | Admitting: *Deleted

## 2011-02-12 ENCOUNTER — Ambulatory Visit (INDEPENDENT_AMBULATORY_CARE_PROVIDER_SITE_OTHER): Payer: 59 | Admitting: Family Medicine

## 2011-02-12 VITALS — BP 134/88

## 2011-02-12 DIAGNOSIS — M7500 Adhesive capsulitis of unspecified shoulder: Secondary | ICD-10-CM

## 2011-02-14 NOTE — Progress Notes (Signed)
  Subjective:    Patient ID: Jamie Mays, female    DOB: Apr 16, 1951, 60 y.o.   MRN: [id number]  HPI  1. F/u left frozen shoulder. Injection helped and did not significatly alter her blood sugars. She has been diligent with her exercises. Feels she is 20-25% better with ROM. 2. Left Hip pain uch better after she started the exercise program 3. Leg cramps significantly improved with the muscle relaxer  PERTINENT  PMH / PSH: Postmenopausal bleeding undergoing work up  Review of Systems    Pertinent review of systems: negative for fever or unusual weight change.  Objective:   Physical Exam  GEN WDWN NAD LEFT SHOULDER--full strength allplanes of rotator cuff. Motion is 120 forward flexion, 100 lateral raise, IR is only hand to side of left hip (about 20 degrees). Distally neurovascularly intact      Assessment & Plan:  1. Frozen shoulder--improved. She does not want a second injection at this time---feels she has too much going on with the work up for her post menopausal bleeding. Agrees to continue her HEP--rtc prn.  2. The muscle relaxers helped her night time leg cramps--unclear why she started having them--no new meds--she thought it might be related to CSi of shoulder but I think that is doubtful. I think she is safe continuing to use them prn.

## 2011-02-17 ENCOUNTER — Other Ambulatory Visit: Payer: Self-pay | Admitting: *Deleted

## 2011-02-17 MED ORDER — HYDROCHLOROTHIAZIDE 12.5 MG PO CAPS: 12.5000 mg | ORAL_CAPSULE | Freq: Every day | ORAL | Status: DC

## 2011-02-23 ENCOUNTER — Ambulatory Visit (INDEPENDENT_AMBULATORY_CARE_PROVIDER_SITE_OTHER): Payer: 59 | Admitting: Obstetrics and Gynecology

## 2011-02-23 ENCOUNTER — Encounter: Payer: Self-pay | Admitting: Obstetrics and Gynecology

## 2011-02-23 VITALS — BP 139/74 | HR 72 | Ht 66.0 in | Wt 165.0 lb

## 2011-02-23 DIAGNOSIS — N950 Postmenopausal bleeding: Secondary | ICD-10-CM

## 2011-02-23 NOTE — Progress Notes (Signed)
  Subjective:    Patient ID: Jamie Mays, female    DOB: [birth year], 60 y.o.   MRN: [id number]  HPI 59 yr G2P1011 postmenopausal for 6 years presenting today for evaluation of vaginal bleeding. Patient describes the recurrence of a 7 day period that tip placed in the month of July. Patient states that during that month she received a steroid injection for the management of her joint pains and subsequent to that she experienced lower back pain and lower pelvic cramping as she normally experienced in the past as premenstrual symptoms. Patient patient then had a period that lasted 7 days. Patient states that prior to that she would experience occasional spotting that would last 1 or 2 days but did not occur on a monthly basis. Patient is otherwise without any complaints.   Review of Systems  All other systems reviewed and are negative.       Objective:   Physical Exam GENERAL: Well-developed, well-nourished female in no acute distress.  ABDOMEN: Soft, nontender, nondistended. No organomegaly. PELVIC: Normal external female genitalia. Vagina is atrophic.  Normal discharge. Normal appearing cervix with polypoid mass visualized at the external os. Uterus is normal in size. No adnexal mass or tenderness. EXTREMITIES: No cyanosis, clubbing, or edema, 2+ distal pulses.       Assessment & Plan:  60 yo G2P1011 with an episode of postmenopausal bleeding presenting for evaluation.  - removal of a 3-cm polypoid mass from cervical os using ring forceps without any complications. - ENDOMETRIAL BIOPSY     The indications for endometrial biopsy were reviewed.   Risks of the biopsy including cramping, bleeding, infection, uterine perforation, inadequate specimen and need for additional procedures  were discussed. The patient states she understands and agrees to undergo procedure today. Consent was signed. Time out was performed.  A sterile speculum was placed in the patient's vagina and the cervix was  prepped with Betadine. A single-toothed tenaculum was placed on the anterior lip of the cervix to stabilize it. The uterine cavity was sounded to a depth of 8 cm using the uterine sound. The 3 mm pipelle was introduced into the endometrial cavity without difficulty, 2 passes were made.  A  moderate amount of tissue was  sent to pathology. The instruments were removed from the patient's vagina. Minimal bleeding from the cervix was noted. The patient tolerated the procedure well.  Routine post-procedure instructions were given to the patient. The patient will follow up in two weeks to review the results and for further management.  - pelvic ultrasound was also ordered - patient will return in 2 weeks to discuss results of biopsy and ultrasound and for further management

## 2011-02-23 NOTE — Progress Notes (Signed)
Addended by: Catalina Antigua on: 02/23/2011 04:20 PM   Modules accepted: Orders

## 2011-02-23 NOTE — Patient Instructions (Signed)
Postmenopausal Bleeding Menopause is the gradual end of ovulation and menstrual periods. Postmenopausal bleeding is bleeding from the uterus 12 months or more after menstrual periods have stopped. Postmenopausal bleeding is different from the few, irregular periods that happen around the time of menopause. Postmenopausal bleeding is common, but not normal. Tell your caregiver if you have any bleeding after menopause.  CAUSES  Hormone therapy (HRT). This is the most common cause of postmenopausal bleeding.  Cervical, ovarian or endometrial cancer.   Thinning of the uterus endometrium (uterine atrophy).   Thyroid and other medical diseases.   Medications other than hormones.   Endometrium infection (endometritis).  Estrogen-secreting tumors in other parts of the body.   Cervical lesions (polps/infection).   Endometrial polyps.   Uterine tumors (fibroid/polyps).   Being very overweight (obese).   SYMPTOMS Postmenopausal bleeding can start in different parts of the reproductive system.   Bleeding from the vagina may happen. This happens because when estrogen secretion stops, the vagina dries out and can weaken (atrophy). This is the most common cause of bleeding from the lower reproductive tract.   Lesions and cracks on the vulva may also bleed.   Sometimes bleeding happens after sexual intercourse.   Bleeding from the cervix if there are polyps, cancer or an infection.   Bleeding can happen with or without a related infection.  DIAGNOSIS  The caregiver will ask for a detailed history of how long bleeding has been going on. A woman should keep a record of the time, the length, how often and amount of bleeding. She should tell the caregiver about any medications she is taking, especially any hormones or steroids.   The caregiver will do a pelvic exam and PAP test. The vulva and vagina are looked at for signs of atrophy. The caregiver will feel for any sign of uterine fibroids.  Depending on the exam results, more testing may be needed.  PROCEDURES  Biopsies may be taken. This is when the caregiver studies a piece of tissue from the body. They look for tissue that is not normal.   Endometrial biopsy takes tissue from the uterine lining.   Cervical biopsy takes tissue from the cervix.   Dilatation and curettage (D & C) is often needed for a diagnosis. This is the best test for diagnosing uterine cancer.   Hysteroscopy is a procedure that uses a long tube with a light. This tube looks inside the uterus.   A vaginal probe ultrasound may be done. This measures the thickness of the endometrium. This procedure does not often show polyps and fibroids in the uterus however.   A saline infusion sonohysterography (SIS) may be done. A salt water (saline) solution is injected into the uterus with a small tube (catheter) before a vaginal probe is put in. The presence of liquid in the uterus helps make any fibroids or polyps more obvious.  TREATMENT Treatment depends on the cause of the bleeding.  It is common for women just beginning HRT to have some bleeding. Most women who are on HRT also take progesterone with estrogen if they still have their uterus. They may have monthly withdrawal bleeding. This is a normal side effect that often does not need treatment. Taking a low dose of estrogen and progesterone daily can prevent having a menstrual period.   Postmenopausal bleeding due to vulva or vaginal bleeding can be treated with estrogen creams, patches or pills.   If cancer is found, some form of surgery is needed. The uterus,   cervix, ovaries and fallopian tubes may all be removed depending on the type and location of the cancer. If the problem is estrogen or androgen-producing tumors elsewhere in the body, these must also be surgically removed.   Postmenopausal bleeding that is not due to cancer and cannot be controlled by any other treatment usually requires a hysterectomy.  This operation is not without risk and possible complications.   If the cause is endometrium thinning, hormones are given.   If there is endometrial hyperplasia, progesterone may be given and/or D and C may be done.   If there are polyps in the uterus, a hysteroscopy and/or D and C is done to remove them.   If fibroids are present, they can be removed or a hysterectomy may be needed.   If the problem is medical, thyroid or other medical treatment is done.   If medication is causing the problem, changing or stopping the medication may be needed.  HOME CARE INSTRUCTIONS  Postmenopausal bleeding is not a preventable disorder. However, maintaining a healthy weight will decrease the chances of it happening.   See your caregiver if you are missing menstrual periods all the time.   A PAP smear is done to screen for cervical cancer.   The first PAP smear should be done at age 21.   Between ages 21 and 29, PAP smears are repeated every 2 years.   Beginning at age 30, you are advised to have a PAP smear every 3 years as long as your past 3 PAP smears have been normal.   Some women have medical problems that increase the chance of getting cervical cancer. Talk to your caregiver about these problems. It is especially important to talk to your caregiver if a new problem develops soon after your last PAP smear. In these cases, your caregiver may recommend more frequent screening and Pap smears.   The above recommendations are the same for women who have or have not gotten the vaccine for HPV (Human Papillomavirus).   If you had a hysterectomy for a problem that was not a cancer or a condition that could lead to cancer, then you no longer need Pap smears.   If you are between ages 65 and 70, and you have had normal Pap smears going back 10 years, you no longer need Pap smears.   If you have had past treatment for cervical cancer or a condition that could lead to cancer, you need Pap smears and  screening for cancer for at least 20 years after your treatment.   See your caregiver if you have any kind of bleeding after menopause.  SEEK MEDICAL CARE IF:  You have any kind of vaginal bleeding after menopause.   You develop bleeding with sexual intercourse.   You are still having menstrual periods after age 55.   You are gaining too much weight.  Document Released: 10/06/2005 Document Re-Released: 07/20/2009 [employer] Patient Information 2011 [employer]. 

## 2011-02-24 ENCOUNTER — Ambulatory Visit (HOSPITAL_COMMUNITY)
Admission: RE | Admit: 2011-02-24 | Discharge: 2011-02-24 | Disposition: A | Discharge status: Home / Self Care | Payer: 59 | Source: Ambulatory Visit | Attending: Family Medicine | Admitting: Family Medicine

## 2011-02-24 DIAGNOSIS — Z1231 Encounter for screening mammogram for malignant neoplasm of breast: Secondary | ICD-10-CM | POA: Insufficient documentation

## 2011-02-24 DIAGNOSIS — Z780 Asymptomatic menopausal state: Secondary | ICD-10-CM

## 2011-02-24 DIAGNOSIS — Z13820 Encounter for screening for osteoporosis: Secondary | ICD-10-CM | POA: Insufficient documentation

## 2011-02-25 ENCOUNTER — Other Ambulatory Visit: Payer: 59

## 2011-03-01 ENCOUNTER — Other Ambulatory Visit: Payer: Self-pay | Admitting: Family Medicine

## 2011-03-01 ENCOUNTER — Telehealth: Payer: Self-pay

## 2011-03-01 DIAGNOSIS — Z1211 Encounter for screening for malignant neoplasm of colon: Secondary | ICD-10-CM

## 2011-03-01 NOTE — Telephone Encounter (Signed)
error 

## 2011-03-01 NOTE — Progress Notes (Signed)
Quick Note:  Pt aware ______ 

## 2011-03-05 ENCOUNTER — Encounter: Payer: Self-pay | Admitting: *Deleted

## 2011-03-05 ENCOUNTER — Encounter: Payer: Self-pay | Admitting: Family Medicine

## 2011-03-16 ENCOUNTER — Telehealth: Payer: Self-pay | Admitting: *Deleted

## 2011-03-16 NOTE — Telephone Encounter (Signed)
Patient is calling to get results of biopsy and needs ultrasound scheduled, as well as follow up appointment with Dr. Jolayne Panther.

## 2011-03-23 ENCOUNTER — Other Ambulatory Visit: Payer: Self-pay | Admitting: Obstetrics and Gynecology

## 2011-03-23 DIAGNOSIS — N950 Postmenopausal bleeding: Secondary | ICD-10-CM

## 2011-03-26 ENCOUNTER — Ambulatory Visit (HOSPITAL_COMMUNITY): Payer: 59

## 2011-03-29 ENCOUNTER — Other Ambulatory Visit: Payer: Self-pay | Admitting: Family Medicine

## 2011-04-22 LAB — DIFFERENTIAL
Basophils Absolute: 0
Basophils Relative: 1
Eosinophils Absolute: 0.2
Eosinophils Relative: 2
Lymphocytes Relative: 26
Lymphs Abs: 2
Monocytes Absolute: 0.7
Monocytes Relative: 9
Neutro Abs: 4.9
Neutrophils Relative %: 62

## 2011-04-22 LAB — CBC
HCT: 44.3
Hemoglobin: 15.4 — ABNORMAL HIGH
MCHC: 34.8
MCV: 87.9
Platelets: 268
RBC: 5.04
RDW: 13
WBC: 7.8

## 2011-04-22 LAB — BASIC METABOLIC PANEL WITH GFR
Chloride: 103
GFR calc non Af Amer: >60
Potassium: 3.6
Sodium: 138

## 2011-04-22 LAB — BASIC METABOLIC PANEL
BUN: 18
CO2: 27
Calcium: 9.7
Chloride: 106
Creatinine, Ser: 0.7
Creatinine, Ser: 0.79
GFR calc Af Amer: >60
GFR calc Af Amer: >60
Glucose, Bld: 146 — ABNORMAL HIGH
Sodium: 142

## 2011-04-22 LAB — HEMOGLOBIN A1C
Hgb A1c MFr Bld: 8.3 — ABNORMAL HIGH
Mean Plasma Glucose: 215
Mean Plasma Glucose: 218

## 2011-04-27 ENCOUNTER — Other Ambulatory Visit: Payer: Self-pay | Admitting: Family Medicine

## 2011-05-10 ENCOUNTER — Encounter: Payer: Self-pay | Admitting: Family Medicine

## 2011-05-10 ENCOUNTER — Ambulatory Visit (INDEPENDENT_AMBULATORY_CARE_PROVIDER_SITE_OTHER): Payer: 59 | Admitting: Family Medicine

## 2011-05-10 VITALS — BP 147/79 | HR 79 | Wt 165.0 lb

## 2011-05-10 DIAGNOSIS — M1610 Unilateral primary osteoarthritis, unspecified hip: Secondary | ICD-10-CM | POA: Insufficient documentation

## 2011-05-10 DIAGNOSIS — M25519 Pain in unspecified shoulder: Secondary | ICD-10-CM

## 2011-05-10 DIAGNOSIS — M25512 Pain in left shoulder: Secondary | ICD-10-CM

## 2011-05-10 NOTE — Progress Notes (Signed)
PT referral info faxed to [facility] on [address] per patient's request.

## 2011-05-11 MED ORDER — NAPROXEN SODIUM 220 MG PO TABS: 220.0000 mg | ORAL_TABLET | Freq: Two times a day (BID) | ORAL | Status: AC

## 2011-05-11 NOTE — Progress Notes (Signed)
  Subjective:    Patient ID: Jamie Mays, female    DOB: [birth year], 60 y.o.   MRN: [id number]  HPI Followup left shoulder and left hip and thigh pain. Has had a lot of family issues and has not really done her exercises. She also stopped taking the diclofenac because she is worried about her liver. Her liver functions have been elevated, she thinks secondary to her diabetes. Does not want to consider another injection in her shoulder. Left-hand-dominant. Pain in shoulders worse with overhead and reaching movements. Aching sharp pain in the left shoulder that does not radiate to the arm or to the back. 4-8/10 depending on the time of day of the activity.  Pain in the left hip and thigh is an aching pain. He is in the front part of the thigh and into the groin area. Worse with going up stairs. She also has some shooting pains in the front part of her shin that may be associated with that but they occur separately as well.  Review of Systems    denies fever, sweats, chills. No unusual weight change. Objective:   Physical Exam  Vital signs reviewed. GENERAL: Well developed, well nourished, no acute distress SHOULDER: Decreased range of motion in forward flexion secondary to pain it about 120. Internal rotation is full and painless external rotation supraspinatus testing is full strength but painful. Distally she is neurovascular intact. PELVIS: Negative Trendelenburg. Symmetrical. Hips: The left greater trochanteric bursa area is nontender to palpation. She has full painless internal and external rotation and this does not reproduce her pain is no tenderness to palpation of the left groin area. The flexor tendons are nontender to palpation. Gait is normal. Lower extremity strength is 5 out of 5 symmetrical. Quadriceps development is symmetrical. Quadriceps is nontender to palpation. Distally she is neurovascularly  Intact.      Assessment & Plan:  Continued problems with left shoulder  pain, left lower extremity pain. We discussed options. We'll do physical therapy referral with possible transition to early home exercise program. See her back in 4 weeks. We discussed NSAIDs and I would like her to take one over-the-counter Aleve twice a day he will consider checking her liver functions and kidney function at next office visit.

## 2011-05-25 ENCOUNTER — Ambulatory Visit: Payer: 59 | Attending: Family Medicine

## 2011-05-25 DIAGNOSIS — M2560 Stiffness of unspecified joint, not elsewhere classified: Secondary | ICD-10-CM | POA: Insufficient documentation

## 2011-05-25 DIAGNOSIS — IMO0001 Reserved for inherently not codable concepts without codable children: Secondary | ICD-10-CM | POA: Insufficient documentation

## 2011-05-25 DIAGNOSIS — M2550 Pain in unspecified joint: Secondary | ICD-10-CM | POA: Insufficient documentation

## 2011-06-01 ENCOUNTER — Encounter: Payer: Self-pay | Admitting: Family Medicine

## 2011-06-01 ENCOUNTER — Ambulatory Visit (HOSPITAL_COMMUNITY)
Admission: RE | Admit: 2011-06-01 | Discharge: 2011-06-01 | Disposition: A | Discharge status: Home / Self Care | Payer: 59 | Source: Ambulatory Visit | Attending: Family Medicine | Admitting: Family Medicine

## 2011-06-01 ENCOUNTER — Other Ambulatory Visit: Payer: Self-pay | Admitting: Family Medicine

## 2011-06-01 DIAGNOSIS — M25559 Pain in unspecified hip: Secondary | ICD-10-CM | POA: Insufficient documentation

## 2011-06-01 DIAGNOSIS — M169 Osteoarthritis of hip, unspecified: Secondary | ICD-10-CM | POA: Insufficient documentation

## 2011-06-01 DIAGNOSIS — M1610 Unilateral primary osteoarthritis, unspecified hip: Secondary | ICD-10-CM | POA: Insufficient documentation

## 2011-06-02 ENCOUNTER — Ambulatory Visit: Payer: 59 | Admitting: Rehabilitation

## 2011-06-07 ENCOUNTER — Ambulatory Visit: Payer: 59

## 2011-06-09 ENCOUNTER — Ambulatory Visit: Payer: 59

## 2011-06-14 ENCOUNTER — Ambulatory Visit: Payer: 59 | Attending: Family Medicine

## 2011-06-14 DIAGNOSIS — IMO0001 Reserved for inherently not codable concepts without codable children: Secondary | ICD-10-CM | POA: Insufficient documentation

## 2011-06-14 DIAGNOSIS — M2550 Pain in unspecified joint: Secondary | ICD-10-CM | POA: Insufficient documentation

## 2011-06-14 DIAGNOSIS — M2560 Stiffness of unspecified joint, not elsewhere classified: Secondary | ICD-10-CM | POA: Insufficient documentation

## 2011-06-17 ENCOUNTER — Ambulatory Visit: Payer: 59

## 2011-06-21 ENCOUNTER — Ambulatory Visit: Payer: 59 | Admitting: Rehabilitation

## 2011-06-24 ENCOUNTER — Ambulatory Visit: Payer: 59 | Admitting: Rehabilitation

## 2011-06-28 ENCOUNTER — Ambulatory Visit: Payer: 59

## 2011-06-29 ENCOUNTER — Other Ambulatory Visit: Payer: Self-pay | Admitting: *Deleted

## 2011-06-29 ENCOUNTER — Ambulatory Visit: Payer: 59 | Admitting: Nurse Practitioner

## 2011-06-29 MED ORDER — LOSARTAN POTASSIUM 100 MG PO TABS: 100.0000 mg | ORAL_TABLET | Freq: Every day | ORAL | Status: DC

## 2011-06-30 ENCOUNTER — Ambulatory Visit: Payer: 59

## 2011-07-22 ENCOUNTER — Telehealth: Payer: Self-pay | Admitting: Family Medicine

## 2011-07-22 MED ORDER — DIAZEPAM 2 MG PO TABS: ORAL_TABLET | ORAL | Status: DC

## 2011-07-22 NOTE — Telephone Encounter (Signed)
[place] Pt has an appt to come into clinic tomorrow to see Dr. Leveda Anna @ 1100.Jamie Mays

## 2011-07-22 NOTE — Telephone Encounter (Signed)
Jamie Mays use to see Dr. Ermalene Searing but switched to Dr. Margaretmary Bayley.  She is an employee with [employer]. 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  She is at home now with inner ear infection and is having difficulty getting out of bed.  Need something called in so she can try to be seen.  She has chosen Dr. Jennette Kettle as her provider, but haven't seen yet.

## 2011-07-22 NOTE — Telephone Encounter (Signed)
Spoke with pt and she informed me that she was having head congestion and pain in her inner ear. No cough, nasal drainage or other pain. She is experiencing vertigo. Pt stated that she has heart arrhythmia. She uses [employer].

## 2011-07-22 NOTE — Telephone Encounter (Signed)
Will forward to Dr. Jennette Kettle for advice on how to proceed.Loralee Pacas [place]

## 2011-07-23 ENCOUNTER — Encounter: Payer: Self-pay | Admitting: Family Medicine

## 2011-07-23 ENCOUNTER — Ambulatory Visit (INDEPENDENT_AMBULATORY_CARE_PROVIDER_SITE_OTHER): Payer: 59 | Admitting: Family Medicine

## 2011-07-23 DIAGNOSIS — H8309 Labyrinthitis, unspecified ear: Secondary | ICD-10-CM | POA: Insufficient documentation

## 2011-07-23 MED ORDER — MECLIZINE HCL 25 MG PO TABS: 25.0000 mg | ORAL_TABLET | Freq: Three times a day (TID) | ORAL | Status: DC | PRN

## 2011-07-23 NOTE — Assessment & Plan Note (Signed)
Likely etiology of diagnosis to recent symptoms as well as concurrent upper respiratory illness. Provided meclizine for relief. Also said that he might help but doesn't last longer during the night to help her sleep. She is to return for checkup at about 10-14 days or sooner any worsening or no improvement.

## 2011-07-23 NOTE — Progress Notes (Signed)
  Subjective:    Patient ID: Jamie Mays, female    DOB: 1951-01-14, 61 y.o.   MRN: [id number]  HPI 1. Veritgo:  Present for past 2 days.  Began experiencing Tuesday night which was the same night she began having nasal sinus congestion and headache. States it was present bilaterally. She has had trouble sleeping for the past several nights because every time she turns over in bed she has vertiginous symptoms which he describes as "room spinning around her." Today she feels somewhat better but still has difficulty walking a certain distance as she has to lean against the wall. Was prescribed Valium yesterday and states this has helped somewhat but symptoms are still present. No changes in hearing, no tinnitus.  No recent fevers or chills.   Review of Systems See HPI above for review of systems.       Objective:   Physical Exam Gen:  Alert, cooperative patient who appears stated age in no acute distress.  Vital signs reviewed. HEENT:  [place]/AT.  EOMI, PERRL.  MMM, tonsils non-erythematous, non-edematous.  External ears WNL, Bilateral TM's normal without retraction, redness or bulging.  Neck:  No thyromegaly. Neuro:  Grossly normal.  No nystagmus noted.  Did experience some vertigo when lying back on table and standing up from chair.  Gait normal.        Assessment & Plan:

## 2011-07-23 NOTE — Patient Instructions (Signed)
I have sent in the Meclizine for you.   You can take this up to 3 times a day if you need. The Valium will help you sleep at night time. It was good to meet you today!  Labyrinthitis (Inner Ear Inflammation)  Your exam shows you have an inner ear disturbance or labyrinthitis. The cause of this condition is not known. But it may be due to a virus infection. The symptoms of labyrinthitis include vertigo or dizziness made worse by motion, nausea and vomiting. The onset of labyrinthitis may be very sudden. It usually lasts for a few days and then clears up over 1-2 weeks.  The treatment of an inner ear disturbance includes bed rest and medications to reduce dizziness, nausea, and vomiting. You should stay away from alcohol, tranquilizers, caffeine, nicotine, or any medicine your doctor thinks may make your symptoms worse. Further testing may be needed to evaluate your hearing and balance system. Please see your doctor or go to the emergency room right away if you have:  Increasing vertigo, earache, loss of hearing, or ear drainage.   Headache, blurred vision, trouble walking, fainting, or fever.   Persistent vomiting, dehydration, or extreme weakness.  Document Released: 06/28/2005 Document Revised: 03/10/2011 Document Reviewed: 12/14/2006 [facility] Patient Information 2012 [place], [place].

## 2011-07-27 ENCOUNTER — Ambulatory Visit (INDEPENDENT_AMBULATORY_CARE_PROVIDER_SITE_OTHER): Payer: 59 | Admitting: Family Medicine

## 2011-07-27 ENCOUNTER — Encounter: Payer: Self-pay | Admitting: Family Medicine

## 2011-07-27 DIAGNOSIS — H8309 Labyrinthitis, unspecified ear: Secondary | ICD-10-CM

## 2011-08-02 NOTE — Assessment & Plan Note (Addendum)
Improving, likely in subacute period.  Explained that this may last a couple of weeks.  Adivised follow-up 7-10 days, or sooner if symptoms worsen.  Continue meclizine at night as needed.

## 2011-08-02 NOTE — Progress Notes (Signed)
  Subjective:    Patient ID: Jamie Mays, female    DOB: 1951-05-12, 61 y.o.   MRN: [id number]  HPI 1.  F/u Labyrinthitis:  Here today for f/u of labyrinthitis.  Diagnosed on 07/23/11 s/p  Viral illness.  Symptoms have improved significantly but still gets episodes of dizziness.   Also had earache yesterday which has resolved today.  Symptoms of vertigo typically come on when laying down on R side.  Sx much worse last week, states "I could not even walk down the hall". Taking meclizine but can only use at night due to sedation.  Denies nausea, vomiting, difficulty with walking or balance, headache.   Review of Systems     Objective:   Physical Exam  Constitutional: She appears well-developed. No distress.  HENT:  Head: Normocephalic and atraumatic.  Right Ear: Tympanic membrane and ear canal normal.  Left Ear: Tympanic membrane and ear canal normal.  Eyes: Right eye exhibits no nystagmus. Left eye exhibits no nystagmus.       Symptoms present when laying down on right side, no nystagmus. IMproved with sitting still          Assessment & Plan:

## 2011-08-16 ENCOUNTER — Ambulatory Visit (INDEPENDENT_AMBULATORY_CARE_PROVIDER_SITE_OTHER): Payer: 59 | Admitting: Family Medicine

## 2011-08-16 VITALS — BP 140/70

## 2011-08-16 DIAGNOSIS — M7500 Adhesive capsulitis of unspecified shoulder: Secondary | ICD-10-CM

## 2011-08-16 DIAGNOSIS — H8309 Labyrinthitis, unspecified ear: Secondary | ICD-10-CM

## 2011-08-16 DIAGNOSIS — H6980 Other specified disorders of Eustachian tube, unspecified ear: Secondary | ICD-10-CM

## 2011-08-16 MED ORDER — FLUTICASONE PROPIONATE 50 MCG/ACT NA SUSP: 2.0000 | Freq: Every day | NASAL | Status: DC

## 2011-08-16 NOTE — Patient Instructions (Signed)
For the shoulder let's try adding straight raises with the theraband. For the vertigo, let's add fluticasone nasal spray, one squirt each nostril once a day. I would probably see you back in about a month.

## 2011-08-18 NOTE — Progress Notes (Signed)
  Subjective:    Patient ID: Jamie Mays, female    DOB: 1951/07/08, 61 y.o.   MRN: [id number]  HPI  Followup left shoulder pain. Was having significant improvement with physical therapy. Developed vertigo, and has not been able to do her exercises since. She is concerned she will lose the progress she gained. The vertigo is somewhat improved but still present particularly with trying to lie on her right side. Has noticed increased fullness in B ears, increased sinus congestion--both of these started before the vertigo arrived. The meclizine and diazepam help but make her very sleepy.  Review of Systems Pertinent review of systems: negative for fever or unusual weight change. Continued intermittent vertigo    Objective:   Physical Exam  Vital signs reviewed. GENERAL: Well developed, well nourished, no acute distress SHOULDER: Left. Overhead forward flexion is 20 short of full. Supraspinatus testing painless. Rotator cuff muscle strength is intact in all planes. NEURO: + vertigo w no nystagmus with right trunk rotation to supine position (even in first 30 degrees of movement) . Gait is normal. HEENT:PERR[place] EOMI TMs B dull, no redness     Assessment & Plan:  #1 adhesive capsulitis that was significantly improving with physical therapy. A small setback since she's been unable to do exercises regularly secondary to her vertigo. Modify her exercise program and do seated forward flexion with thera band. #2. For the vertigo we'll add fluticasone nasal spray as I think there is some component of(#3) eustachian tube dysfunction. She has followup with me in 2-3 weeks.

## 2011-09-07 ENCOUNTER — Other Ambulatory Visit: Payer: Self-pay | Admitting: Internal Medicine

## 2011-09-07 DIAGNOSIS — E041 Nontoxic single thyroid nodule: Secondary | ICD-10-CM

## 2011-09-10 ENCOUNTER — Telehealth: Payer: Self-pay | Admitting: Family Medicine

## 2011-09-10 NOTE — Telephone Encounter (Signed)
Have a boil to rt ear.  Need med sent to outpatient pharmacy.

## 2011-09-12 NOTE — Telephone Encounter (Signed)
Dear [facility] Team No med needed unless it does not resolve---use hot compresses---if not resolving she needs to be seen--I am on inpat this coming week THANKS! Denny Levy

## 2011-09-13 NOTE — Telephone Encounter (Signed)
LVM informing patient of below.

## 2011-09-15 ENCOUNTER — Ambulatory Visit
Admission: RE | Admit: 2011-09-15 | Discharge: 2011-09-15 | Disposition: A | Discharge status: Home / Self Care | Payer: 59 | Source: Ambulatory Visit | Attending: Internal Medicine | Admitting: Internal Medicine

## 2011-09-15 ENCOUNTER — Other Ambulatory Visit: Payer: Self-pay | Admitting: Internal Medicine

## 2011-09-15 ENCOUNTER — Other Ambulatory Visit (HOSPITAL_COMMUNITY)
Admission: RE | Admit: 2011-09-15 | Discharge: 2011-09-15 | Disposition: A | Discharge status: Home / Self Care | Payer: 59 | Source: Ambulatory Visit | Attending: Interventional Radiology | Admitting: Interventional Radiology

## 2011-09-15 DIAGNOSIS — E049 Nontoxic goiter, unspecified: Secondary | ICD-10-CM | POA: Insufficient documentation

## 2011-09-15 DIAGNOSIS — E041 Nontoxic single thyroid nodule: Secondary | ICD-10-CM

## 2011-09-20 ENCOUNTER — Ambulatory Visit (INDEPENDENT_AMBULATORY_CARE_PROVIDER_SITE_OTHER): Payer: 59 | Admitting: Family Medicine

## 2011-09-20 VITALS — BP 130/70

## 2011-09-20 DIAGNOSIS — H8309 Labyrinthitis, unspecified ear: Secondary | ICD-10-CM

## 2011-09-20 DIAGNOSIS — M7500 Adhesive capsulitis of unspecified shoulder: Secondary | ICD-10-CM

## 2011-09-22 ENCOUNTER — Telehealth: Payer: Self-pay | Admitting: *Deleted

## 2011-09-22 MED ORDER — ZOSTER VACCINE LIVE 19400 UNT/0.65ML ~~LOC~~ SOLR: 0.6500 mL | Freq: Once | SUBCUTANEOUS | Status: AC

## 2011-09-22 NOTE — Telephone Encounter (Signed)
Patient calls requesting Zostavax. Will ask Dr. Jennette Kettle to put in order to the  [facility] and patient will pick up.  . Patient will schedule appointment here  for the injection.

## 2011-09-23 NOTE — Progress Notes (Signed)
  Subjective:    Patient ID: Jamie Mays, female    DOB: [birth year], 61 y.o.   MRN: [id number]  HPI  #1. Followup for right labyrinthitis. Still improving. Able to do some beginning exercises but not back to a point where she can do her full shoulder program. No new symptoms. #2. Left shoulder pain is not improved from last visit. She thinks this is largely because she has not been doing the full exercise program. #3. Had a thyroid biopsy done by her endocrinologist the other day.  Review of Systems    denies fever, sweats, chills. Continues to have positional vertigo but it's about 60% better from when it started. Objective:   Physical Exam  Vital signs reviewed. GENERAL: Well developed, well nourished, no acute distress SHOULDER: Left shoulder pain with elevation past 130 in forward flexion and passed 110 in lateral elevation. Rotator cuff muscle strength is intact. Distally she is neurovascularly intact.      Assessment & Plan:  #1. Labyrinthitis improving. We discussed this at some length. Should this not totally resolve or should she have return of symptoms I would like her to see ENT. #2. Adhesive capsulitis of left shoulder. I tried to convince her to do a corticosteroid injection into the glenohumeral joint today but she will soon give another try to exercise program. Also her back in 4 weeks. #3. Thyroid nodule. She is following up with her endocrinologist.

## 2011-10-01 ENCOUNTER — Ambulatory Visit (INDEPENDENT_AMBULATORY_CARE_PROVIDER_SITE_OTHER): Payer: 59 | Admitting: *Deleted

## 2011-10-01 VITALS — Temp 97.8°F

## 2011-10-01 DIAGNOSIS — Z23 Encounter for immunization: Secondary | ICD-10-CM

## 2011-10-01 MED ORDER — ZOSTER VACCINE LIVE 19400 UNT/0.65ML ~~LOC~~ SOLR: 0.6500 mL | Freq: Once | SUBCUTANEOUS | Status: AC

## 2011-10-01 MED ORDER — ZOSTER VACCINE LIVE 19400 UNT/0.65ML ~~LOC~~ SOLR: 0.6500 mL | Freq: Once | SUBCUTANEOUS | Status: DC

## 2011-10-01 NOTE — Progress Notes (Signed)
Patient brings in Zostavax from  [facility]. Zostavax 0.65 ml given Subq in right arm, [employer] lot # [id number] exp date 11/08/2012.

## 2011-11-17 ENCOUNTER — Other Ambulatory Visit: Payer: Self-pay | Admitting: Family Medicine

## 2011-11-24 ENCOUNTER — Telehealth: Payer: Self-pay | Admitting: Family Medicine

## 2011-11-24 NOTE — Telephone Encounter (Signed)
Done Given to [facility] to scan and contact pt

## 2011-11-24 NOTE — Telephone Encounter (Signed)
Jamie Mays needed to have the form filled out for her vertigo and 3 days monthly would be sufficient.  Also needed the name of an ENT she could see.

## 2011-12-31 ENCOUNTER — Ambulatory Visit: Payer: 59 | Admitting: Family Medicine

## 2012-02-07 ENCOUNTER — Other Ambulatory Visit: Payer: Self-pay | Admitting: Family Medicine

## 2012-06-20 ENCOUNTER — Ambulatory Visit: Payer: 59 | Admitting: *Deleted

## 2012-06-23 ENCOUNTER — Ambulatory Visit: Payer: 59 | Admitting: *Deleted

## 2012-07-26 ENCOUNTER — Ambulatory Visit (INDEPENDENT_AMBULATORY_CARE_PROVIDER_SITE_OTHER): Payer: Self-pay | Admitting: Family Medicine

## 2012-07-26 DIAGNOSIS — E119 Type 2 diabetes mellitus without complications: Secondary | ICD-10-CM

## 2012-08-29 NOTE — Progress Notes (Signed)
Patient presents today for 3 months DM follow-up as part of the employer-sponsored Link to Wellness program.  Medications, insulin regimen, and glucose readings have been reviewed.  I have also discussed with patient lifestyle interventions including diet and exercise.  Details of this visit may be found in [employer] documenting program through [employer] [facility]).  Patient has set a series of personal goals and will follow-up in 3 months for further review of DM.

## 2012-10-02 NOTE — Progress Notes (Signed)
Patient ID: Jamie Mays, female   DOB: 1951-06-27, 62 y.o.   MRN: [id number] ATTENDING PHYSICIAN NOTE: I have reviewed the chart and agree with the plan as detailed above. Denny Levy MD Pager [phone]

## 2012-10-25 ENCOUNTER — Ambulatory Visit (INDEPENDENT_AMBULATORY_CARE_PROVIDER_SITE_OTHER): Payer: Self-pay | Admitting: Family Medicine

## 2012-10-25 VITALS — BP 128/68 | Wt 164.0 lb

## 2012-10-25 DIAGNOSIS — E119 Type 2 diabetes mellitus without complications: Secondary | ICD-10-CM

## 2012-10-27 NOTE — Progress Notes (Signed)
Patient presents today for 3 month diabetes follow-up as part of the employer-sponsored Link to Wellness program. Current diabetes regimen includes Novolog via pump. Patient also continues on daily ASA. Most recent MD follow-up was yesterday with Dr. Chestine Spore. Patient has a 6 month follow-up appt for August. No med changes or major health changes at this time.   Diabetes Assessment: Type of Diabetes: Type 1; MD managing Diabetes Dr. Margaretmary Bayley, endo; checks feet daily; uses glucometer; takes an aspirin a day; takes medications as prescribed; hypoglycemia frequency rare; checks blood glucose more than 4 times a day; 7 day CBG average 169; 30 day CBG average 177; Lowest CBG 56; Highest CBG 292; 14 day CBG average 193; A1c pending from recent MD appt.   Other Diabetes History: Current med regimen includes Novolog via pump. Patient continues using Medtronic pump with no issues. Patient does maintain good medication compliance. Patient did bring meter today and is currently testing 3-4 times per day. Patient continues using [employer] Link meter and has tested 60 times over the past 14 days with 25 readings in range (70-180), 34 readings above target, and 1 reading below target (CBG - 56). No basal rate changes were made at patient most recent MD visit. A1c was drawn and is pending. Hypoglycemia frequency is rare. Patient does demonstrate appropriate correction of hypoglycemia when necessary. Patient denies signs and symptoms of neuropathy including numbness/tingling/burning and symptoms of foot infection. Patient is due for yearly eye exam and also needs to schedule an appt for cataract exam and removal.   Hypertension Assessment: Patient is no longer takin losartan due to hypotensive episodes. In addition, she has decreased her HCTZ to 12.5 mg every other day. Patient does own a BP monitor and checks BP once per week. I have encouraged her to monitor BP more regularly, at least for 1-2 weeks in order to gain  a better understanding of BP.   Lifestyle Factors: Diet - Patient continues attempting to eat a healthy diet. Patient has made improvement in snack choices and is now eating more celery and fresh vegetables. She does admit to dipping these in ranch dressing. Patient's biggest struggle at this time is portion control. She will work on this over the next few months.  Exercise - Patient continues walking about 3 times per week for about 30 minutes each time.   Assessment: Patient presents today after having just been to follow-up with MD. No changes were made at this visit and A1c is pending. Patient has room for improvement in diet and exercise and has set some goals at this time. .  Plan: 1) Attempt to limit portion sizes and attempt to eat a salad at least 3-4 times per week 2) Maintain exercise three times per week 3) Continue testing regularly, attempt to find meter software for downloading results 4) Return in 3 months on Wednesday July 23nd @ 4:00 pm

## 2012-11-02 NOTE — Progress Notes (Signed)
Patient ID: Jamie Mays, female   DOB: 01/26/1951, 61 y.o.   MRN: [id number] ATTENDING PHYSICIAN NOTE: I have reviewed the chart and agree with the plan as detailed above. [name] MD Pager [phone]  

## 2012-11-27 ENCOUNTER — Ambulatory Visit: Payer: 59 | Admitting: Family Medicine

## 2012-12-12 ENCOUNTER — Other Ambulatory Visit: Payer: Self-pay | Admitting: Family Medicine

## 2013-02-14 ENCOUNTER — Ambulatory Visit (INDEPENDENT_AMBULATORY_CARE_PROVIDER_SITE_OTHER): Payer: 59 | Admitting: Family Medicine

## 2013-02-14 VITALS — BP 141/71 | Wt 163.0 lb

## 2013-02-14 DIAGNOSIS — E119 Type 2 diabetes mellitus without complications: Secondary | ICD-10-CM

## 2013-02-15 NOTE — Progress Notes (Signed)
Patient presents today for 3 month diabetes follow-up as part of the employer-sponsored [employer] to Well[employer] program. Current diabetes regimen includes Novolog via insulin pump. Patient also continues on daily ASA. Most recent MD follow-up was this past April and she will follow-up again in 4 months. No med changes or major health changes at this time.   Diabetes Assessment: Type of Diabetes: Type 1; Sees Diabetes provider 3 times per year; MD managing Diabetes Dr. Margaretmary Bayley, endo; checks feet daily; uses glucometer; takes an aspirin a day; takes medications as prescribed; hypoglycemia frequency none; Lowest CBG 85 Other Diabetes History: Current med regimen includes Novolog via pump. Patient continues using Medtronic pump with no issues. Patient does maintain good medication compliance. Patient did bring meter today and is currently testing 4-6 times per day. This is an increase in testing due to recently uncontrolled glucose. Patient continues using Contour Next Link meter and has tested 78 times over the past 14 days with 27 readings in range (70-180), 51 readings above target, and 0 reading below target. There have been a couple of small basal rate changes made recently as instructed by MD due to elevated glucose, especially fasting glucose. A1c will be drawn by MD at next follow-up in end of August. Hypoglycemia frequency is rare. Patient does demonstrate appropriate correction of hypoglycemia when necessary. Patient denies signs and symptoms of neuropathy including numbness/tingling/burning and symptoms of foot infection. Patient is due for yearly eye exam and also needs to schedule an appt for cataract exam and removal. Patient expressed much frustration regarding her DM at this visit. I am hopeful she will gain better control soon and that she and her MD can make appropriate changes to her pump settings to acheive control.   Lifestyle Factors: Diet - Patient continues attempting to eat a healthy diet.  Patient's biggest struggle at this time continues to be portion control. She will work on this over the next few months. Per patient report, food recall is as follows:  Breakfast - Breakfast once per week, make at home, oatmeal, grits/boiled egg, cheese toast Lunch - eats out often and sometimes eats from the cafeteria Dinner - Cooks at home, tries to eat at least one vegetable, limiting fried foods and starches Beverages - Diet soda (sunkist), splenda-sweetened tea, water, coffee in AM Sweets - tries not to purchase too many of these Exercise - Not walking as much due to arthritis pain, she has been in a flare up for past 4-5 months, mainly at the hip joint. Once her pain has resolved and becomes more manageable, she is committed to resuming her exercise.   Assessment: Patient presents today with deteriorated diabetes control. She is somewhat discouraged by this and expresses this at today's appt. Patient has not been able to exercise regularly due to arthritis pain in hip. She has attempted to maintain a healthy diet, but admits to struggling with portion control. A1c will be drawn at next MD follow-up. Patient has room for improvement in diet and exercise and has set some goals at this time.   Plan: 1) Exercise as tolerated 2) Watch portion sizes  3) Continue testing regularly 4) Follow-up with Dr. Chestine Spore end of August 5) Follow-up with [facility] in 3 months on Wednesday November 5th @ 4:00 am

## 2013-03-29 NOTE — Progress Notes (Signed)
Patient ID: Jamie Mays, female   DOB: 1951/07/05, 62 y.o.   MRN: [id number] ATTENDING PHYSICIAN NOTE: I have reviewed the chart and agree with the plan as detailed above. Denny Levy MD Pager [phone]

## 2013-05-16 ENCOUNTER — Ambulatory Visit (INDEPENDENT_AMBULATORY_CARE_PROVIDER_SITE_OTHER): Payer: Self-pay | Admitting: Family Medicine

## 2013-05-16 VITALS — BP 128/78 | Wt 163.0 lb

## 2013-05-16 DIAGNOSIS — E119 Type 2 diabetes mellitus without complications: Secondary | ICD-10-CM

## 2013-05-16 NOTE — Progress Notes (Signed)
Patient presents today for 3 month diabetes follow-up as part of the employer-sponsored [employer] to Wellness program. Current diabetes regimen includes Novolog via pump. Patient also continues on daily ASA. Most recent MD follow-up was August 2014. Patient has a pending appt for December 2014. No major health changes at this time. Patient discontinued HCTZ ~1 month ago due to dizziness and hypotension (DBP as low as 40s). Symptoms have resolved and BP remains under control since discontinuing HCTZ. MD aware.   Diabetes Assessment: Type of Diabetes: Type 1; Sees Diabetes provider 3 times per year; MD managing Diabetes Dr. Margaretmary Bayley, endo; checks feet daily; uses glucometer; takes an aspirin a day; takes medications as prescribed; hypoglycemia frequency none; Highest CBG 382 (special K red berries); Lowest CBG 89; checks blood glucose more than 4 times a day Pt does an excellent job of SMBG and tests anywhere from 4-6 times per day. ; Other Diabetes History: Current med regimen includes Novolog via pump. Patient continues using Medtronic pump with no issues. Patient does maintain good medication compliance. Patient did bring meter today and is currently testing 3-4 times per day. Patient continues using Contour Next Link meter and has tested 55 times over the past 14 days with 29 readings in range (70-180), 26 readings above target, and 0 reading below target. Glucose readings have been elevated over the past week due to a poor site, but pt has now changed this site and feels glucose is normalizing. October was a great month with glucose primarly <180 and under great control. Hyperglycemia can often be attributed to dietary discrepancy. Hypoglycemia frequency is rare. Patient does demonstrate appropriate correction of hypoglycemia when necessary. Patient denies signs and symptoms of neuropathy including numbness/tingling/burning and symptoms of foot infection. Patient is due for yearly eye exam and also needs to  schedule an appt for cataract exam and removal. She is waiting on a quote from insurance so she may select the preferred location for her surgery. A1c of 7.1.  Lifestyle Factors: Diet - Patient continues attempting to eat a healthy diet. Patient's has continued working toward portion control. Per patient report, food recall is as follows:  Breakfast - Breakfast once per week, make at home, oatmeal, grits/boiled egg, cheese toast Lunch - eats out often and sometimes eats from the cafeteria, or drug rep lunch, tries to choose a salad or cooked veg from hot line.  Dinner - Cooks at home, has attempted to increase vegetables, continues limiting fried foods and starches Pt would like to work on limiting breads and simple carbs.  Exercise - Arthritis flare has subsided and pt is now able to tolerate more exercise. She walks daily on her lunch break unless she has a lunch time work meeting. She also continues taking the stairs vs elevator and attempts to increase steps walked per day.  Assessment: Patient presents today with deteriorated diabetes control. She is somewhat discouraged by this and expresses this at today's appt. Patient has not been able to exercise regularly due to arthritis pain in hip. She has attempted to maintain a healthy diet, but admits to struggling with portion control. A1c will be drawn at next MD follow-up. Patient has room for improvement in diet and exercise and has set some goals at this time.   Plan: 1) Attempt to limit breads and simple carbohydrates, also attempt to limit portions of these (ex: cracker or fritos with chili) 2) Continue exercising as often as possible 3) Continue testing regularly 4) Follow-up in 3 months on  Wednesday February 11th @ 4:00 pm

## 2013-09-11 ENCOUNTER — Ambulatory Visit (INDEPENDENT_AMBULATORY_CARE_PROVIDER_SITE_OTHER): Payer: Self-pay | Admitting: Family Medicine

## 2013-09-11 VITALS — BP 128/76 | Wt 165.0 lb

## 2013-09-11 DIAGNOSIS — E119 Type 2 diabetes mellitus without complications: Secondary | ICD-10-CM

## 2013-09-12 NOTE — Progress Notes (Signed)
Patient presents today for 3 month diabetes follow-up as part of the employer-sponsored [employer] to Well[employer] program. Current diabetes regimen includes Novolog via pump. Patient also continues on daily ASA, ARB. Most recent MD follow-up was Feb 2015. Patient has a pending appt for 4 mo follow-up in May 2015. At recent appt, patient was started on Benicar for HTN. She was not tolerating HCTZ. No other med changes at this time.   Diabetes Assessment: Type of Diabetes: Type 1; Sees Diabetes provider 3 times per year; MD managing Diabetes Dr. Jeanann Lewandowsky, endo; checks feet daily; uses glucometer; takes an aspirin a day; takes medications as prescribed; hypoglycemia frequency none; checks blood glucose more than 4 times a day Pt does an excellent job of SMBG and tests anywhere from 4-6 times per day. ; Lowest CBG 59; Highest CBG >300; A1c 7.7  Other Diabetes History: Current med regimen includes Novolog via pump. Patient continues using Medtronic pump with no issues. Patient does maintain good medication compliance. Patient did not bring meter today but is currently testing 4+ times per day. Patient continues using Contour Next Link meter with most readings ranging 140-210 per patient recall, with outliers as high as >300 and as low as 50s. Hyperglycemia can often be attributed to dietary discrepancy. Hypoglycemia frequency is rare. Patient does demonstrate appropriate correction of hypoglycemia when necessary. Patient denies signs and symptoms of neuropathy including numbness/tingling/burning and symptoms of foot infection. Patient had yearly eye exam in December which was negative for retinopathy. She also had cataract surgery in January.   Lifestyle Factors: Diet - Patient admits that portion sizes have increased somewhat during the colder months and due to spending more time indoors. Despite this she continues attempting to eat a healthy diet. Per patient report, food recall is as follows:  Breakfast -  Breakfast once per week, make at home, oatmeal, grits/boiled egg, cheese toast Lunch - eats out often and sometimes eats from the cafeteria, or drug rep lunch, tries to choose a salad or cooked veg from hot line.  Dinner - Cooks at home, has attempted to increase vegetables, continues limiting fried foods and starches Exercise - Arthritis flare still causing some trouble, and patient has done very little exercise due to colder weather, etc. She would like to walk daily on her lunch break but has not been able to do this regularly due to numerous lunch-time meetings at work.   Assessment: Patient presents today with deteriorated diabetes control. She is somewhat discouraged by this and expresses this at today's appt. Patient has not been able to exercise regularly due to arthritis pain in hip. She has attempted to maintain a healthy diet, but admits to struggling with portion control. A1c will be drawn at next MD follow-up. Patient has room for improvement in diet and exercise and has set some goals at this time.   Plan: 1) Use caution with portion sizes and attempt to better control this over the coming months 2) Increase exercise as weather and time allows 3) Continue testing regularly 4) Follow-up in 3 months on Tuesday June 2nd @ 4:00 pm

## 2013-11-02 ENCOUNTER — Encounter (INDEPENDENT_AMBULATORY_CARE_PROVIDER_SITE_OTHER): Payer: Self-pay

## 2013-11-02 ENCOUNTER — Encounter: Payer: Self-pay | Admitting: Family Medicine

## 2013-11-02 ENCOUNTER — Ambulatory Visit (INDEPENDENT_AMBULATORY_CARE_PROVIDER_SITE_OTHER): Payer: 59 | Admitting: Family Medicine

## 2013-11-02 VITALS — BP 154/76 | HR 80 | Ht 66.5 in | Wt 165.0 lb

## 2013-11-02 DIAGNOSIS — S29012A Strain of muscle and tendon of back wall of thorax, initial encounter: Secondary | ICD-10-CM

## 2013-11-02 DIAGNOSIS — S239XXA Sprain of unspecified parts of thorax, initial encounter: Secondary | ICD-10-CM

## 2013-11-02 NOTE — Patient Instructions (Signed)
Wall or floor push ups--20 twice a day Shoulder shrugs with 5-10 pound weights--20  Twice a day theraband pulls--20 twice a day

## 2013-11-02 NOTE — Progress Notes (Signed)
   Subjective:    Patient ID: Jamie Mays, female    DOB: 08/12/1950, 63 y.o.   MRN: [id number]  HPI Upper back pain for the last 4-6 weeks, significantly worse over the last 2 weeks. Left-hand-dominant. Works at a computer most of the day. They have changed her work station where she is now sitting at a little bit of it and to her computer. This is the only recent change. Pain is in the mid upper back, at times it is knifelike and burning up under the right shoulder blade. Sometimes up in the right trapezius as well. No numbness or tingling in her hands. No weakness in her upper extremities. No shortness of breath. No murmur. Pain is related to long periods of sitting, not related to exertion.   Review of Systems No fever, sweats, chills    Objective:   Physical Exam  Vital signs are reviewed GENERAL: Well-developed female no acute distress SHOULDERS: Bilaterally she has full painless range of motion. Normal strength in all planes the rotator cuff. Distally neurovascularly intact. BACK: Upper back over the rhomboid area on the right reveals a lot of muscle spasm. Palpation here reproduces some of her pain.      Assessment & Plan:  Rhomboid spasm. We'll start her on upper back program. She's not improving she'll return to clinic.

## 2013-11-30 ENCOUNTER — Encounter: Payer: Self-pay | Admitting: Family Medicine

## 2013-11-30 ENCOUNTER — Ambulatory Visit (INDEPENDENT_AMBULATORY_CARE_PROVIDER_SITE_OTHER): Payer: 59 | Admitting: Family Medicine

## 2013-11-30 VITALS — BP 139/77 | Ht 66.0 in | Wt 160.0 lb

## 2013-11-30 DIAGNOSIS — M546 Pain in thoracic spine: Secondary | ICD-10-CM

## 2013-11-30 NOTE — Progress Notes (Signed)
CC: Followup pericapular pain HPI: Jamie Mays returns in followup today. She states that she continues to have periscapular pain. She was doing the home exercises and the pain did improve. However then she stopped doing her exercises as frequently and developed left periscapular pain instead of the previous right periscapular pain. She also notes that it seems to bother her most at night. For the last 2 weeks she has noticed intermittent numbness in her right forearm and fourth and fifth fingers. She does have some neck stiffness. No weakness.  ROS: As above in the HPI. All other systems are stable or negative.  OBJECTIVE: APPEARANCE:  Patient in no acute distress.The patient appeared well nourished and normally developed. HEENT: No scleral icterus. Conjunctiva non-injected Resp: Non labored Skin: No rash MSK:  Neck: Spurling's creates pain at the base of the neck but no radicular pain Full neck range of motion Grip strength and sensation normal in bilateral hands Strength good C4 to T1 distribution No sensory change to C4 to T1 Reflexes normal   No scapular dyskinesis She has reproduction of her ulnar nerve tingling with cubital tunnel Tinel's  MSK [place]: Not performed   ASSESSMENT: #1. Recurrent periscapular pain, suspect muscular #2. Right intermittent arm tingling and ulnar nerve distribution, favor cubital tunnel versus cervical radicular. Not the main component of her symptoms at this time.  PLAN: Discussed treatment options with the patient. She would like to try to avoid medications if possible. We will therefore refer her to physical therapy for scapular stabilization program as well as her neck. She was encouraged to try topical things like bio freeze and icy hot as well as ice and heat. She may take Aleve as needed. Followup if she is not approving in 4-6 weeks.

## 2013-11-30 NOTE — Patient Instructions (Signed)
Thank you for coming in today  Refer to physical therapy  Consider aleve 2 tablets 2x per day if your pain flares  Followup 4-6 weeks if your pain does not improve

## 2013-12-18 ENCOUNTER — Ambulatory Visit: Payer: 59 | Attending: Family Medicine | Admitting: Physical Therapy

## 2014-01-22 ENCOUNTER — Other Ambulatory Visit: Payer: Self-pay | Admitting: Internal Medicine

## 2014-01-22 DIAGNOSIS — E042 Nontoxic multinodular goiter: Secondary | ICD-10-CM

## 2014-01-29 ENCOUNTER — Other Ambulatory Visit (HOSPITAL_COMMUNITY): Payer: 59

## 2014-01-29 ENCOUNTER — Ambulatory Visit (HOSPITAL_COMMUNITY): Payer: 59

## 2014-01-30 ENCOUNTER — Ambulatory Visit: Payer: 59 | Admitting: Family Medicine

## 2014-02-05 ENCOUNTER — Ambulatory Visit (HOSPITAL_COMMUNITY): Admission: RE | Admit: 2014-02-05 | Payer: 59 | Source: Ambulatory Visit

## 2014-02-05 ENCOUNTER — Ambulatory Visit (INDEPENDENT_AMBULATORY_CARE_PROVIDER_SITE_OTHER): Payer: Self-pay | Admitting: Family Medicine

## 2014-02-05 VITALS — BP 128/68 | Wt 164.0 lb

## 2014-02-05 DIAGNOSIS — E109 Type 1 diabetes mellitus without complications: Secondary | ICD-10-CM

## 2014-02-05 NOTE — Progress Notes (Signed)
Patient presents today for 3 month diabetes follow-up as part of the employer-sponsored Link to Wellness program. Current diabetes regimen includes Novolog via pump. Patient also continues on daily ASA, ARB. Most recent MD follow-up was July 2015. Patient has a pending appt for 4 mo follow-up in Nov 2015. Patient has resumed HCTZ at reduced dose. She has also switched from Benicar back to losartan for cost savings. Patient is tolerating these well. No major health changes.  Diabetes Assessment: Type of Diabetes: Type 1; Sees Diabetes provider 3 times per year; MD managing Diabetes Dr. Jeanann Lewandowsky, endo; checks feet daily; uses glucometer; takes an aspirin a day; takes medications as prescribed; hypoglycemia frequency none; checks blood glucose more than 4 times a day Pt does an excellent job of SMBG and tests anywhere from 4-6 times per day. ; Lowest CBG 59; Highest CBG >300; A1c 7.8 (prev 7.7) Other Diabetes History: Current med regimen includes Novolog via pump. Patient continues using Medtronic pump with no issues. Patient does maintain good medication compliance. Patient starting using a new meter as of early July and she did not bring this one today. However, she did have her previous meter which had readings through June. She is currently testing 4+ times per day. Patient continues using Contour Next Link meter with several hyperglycemic readings, nearly all readings >160 and several >200, some as high as >300. Patient has consistenly elevated fasting glucose of >180. She was instructed to increase basal rates by Dr. Carlis Abbott. She reports this has not resolved the issue. She adjusts basal rates herself and is comfortable with this process. I have encouraged her to adjust the night time basal rates in an effort to reduce AM highs. Patient will give this a try. Hypoglycemia frequency is occassional and generally a result of meal skipping. I have recommended patient test after meals in order to assess adequacy  of carb ratio. She has agreed to try this. Patient does demonstrate appropriate correction of hypoglycemia when necessary. Patient denies signs and symptoms of neuropathy including numbness/tingling/burning and symptoms of foot infection. Patient had yearly eye exam in December which was negative for retinopathy. She also plans to have cataract surgery soon.   Lifestyle Factors: Diet - Diet has been altered due to care of mother-in-law. 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 This is due to recent hip fracture and need for increased level of care. This has drastically altered her daily routine and her deitary habits. She is attempting to regain some normalcy, but is struggling at this time. Patient continues to be compliant with bolus dosing and carb coutning. She admits that food choices are not as good as they have been in the past.  Exercise - No exercise at this time due to full time care of her mother-in-law. Patient realizes she would feel better if she were exercising and plans to resume, but is struggling with changes in routine and time due to care of family member.   Assessment: Patient presents today with slightly deteriorated diabetes control. A1c of 7.8 (vs 7.7). She is somewhat discouraged by this and expresses this at today's appt. Patient has not been able to exercise regularly due to care of family member. She has attempted to maintain a healthy diet, but admits to struggling with this as well. Patient will attempt to gain better control and hopes for improved A1c at next visit. Will follow-up in 3 months.  Plan:  1) Continue to maintain healthy dietary choices 2)  Resume exercise when schedule allows 3) Continue testing, consider testing after meals to better adjust carb ratio 4) Adjust night time basal rate as instructed by MD to help reduce early AM hyperglycemia 5) Follow-up in 3 months on Tuesday October 27th @ 11:00 am

## 2014-03-20 ENCOUNTER — Encounter: Payer: Self-pay | Admitting: Family Medicine

## 2014-03-20 NOTE — Progress Notes (Signed)
Patient ID: Jamie Mays, female   DOB: 05/03/1951, 63 y.o.   MRN: [id number] Reviewed:  Agree with the documentation and management of our [facility] pharmacologist.

## 2014-03-20 NOTE — Progress Notes (Signed)
Patient ID: Jamie Mays, female   DOB: Feb 16, 1951, 63 y.o.   MRN: [id number] Reviewed:  Agree with the documentation and management of our [facility] pharmacologist.

## 2014-03-20 NOTE — Progress Notes (Signed)
Patient ID: Jamie Mays, female   DOB: [birth year], 63 y.o.   MRN: [id number] Reviewed:  Agree with the documentation and management of our [facility] pharmacologist.

## 2014-05-07 ENCOUNTER — Ambulatory Visit (INDEPENDENT_AMBULATORY_CARE_PROVIDER_SITE_OTHER): Payer: Self-pay | Admitting: Family Medicine

## 2014-05-07 VITALS — BP 124/66 | Wt 161.0 lb

## 2014-05-07 DIAGNOSIS — E109 Type 1 diabetes mellitus without complications: Secondary | ICD-10-CM

## 2014-05-14 NOTE — Progress Notes (Signed)
Patient presents today for 3 month diabetes follow-up as part of the employer-sponsored Link to Wellness program. Current diabetes regimen includes Novolog via pump. Patient also continues on daily ASA, ARB. Most recent MD follow-up was July 2015. Patient has a pending appt for 4 mo follow-up in Nov 2015. No major health changes or med changes at this visit.  Diabetes Assessment: Type of Diabetes: Type 1; Sees Diabetes provider 3 times per year; MD managing Diabetes Dr. Jeanann Lewandowsky, endo; checks feet daily; uses glucometer; takes an aspirin a day; takes medications as prescribed; hypoglycemia frequency none; Lowest CBG 59; 14 day CBG average 176; Highest CBG 270; checks blood glucose 3-4 times a day Pt does an excellent job of SMBG and tests anywhere from 4-6 times per day. ; Other Diabetes History: Current med regimen includes Novolog via pump. Patient continues using Medtronic pump and [employer] Contour Next meter with no issues. Patient does maintain good medication compliance. She is currently testing 1-4 times per day. Patient continues to have several hyperglycemic readings, some as high as >250. Patient continues to have consistenly elevated fasting glucose of >180. She was instructed to increase basal rates by Dr. Carlis Abbott. She reports this has not resolved the issue. She will further adjust the early AM basal rate to better control fasting glucose. She adjusts basal rates herself and is comfortable with this process. Hypoglycemia frequency is occassional and generally a result of meal skipping. I have recommended patient test after meals in order to assess adequacy of carb ratio. She has agreed to try this. Patient does demonstrate appropriate correction of hypoglycemia when necessary. Patient denies signs and symptoms of neuropathy including numbness/tingling/burning and symptoms of foot infection. Patient had yearly eye exam in December which was negative for retinopathy. She also underwent cataract surgery  approximately 1 week ago and is doing well post-op. A1c at this visit has decreased significantly despite the fact that overall DM care has deteriorated. A1c today of 7.0 (prev 7.8).  Lifestyle Factors: Diet - At last visit patient was struggling with diet due to lifestyle changes associated with caring for elderly family member. Since this appt, this family member has passed and patient and her husband have been busy with handling the estate, etc. Patient is attempting to get back on track with healthy dietary choices, but is struggling with this. Patient continues to be compliant with bolus dosing and carb coutning.  Exercise - No exercise at this time. Patient's largest barrier is lack of time. In the past, patient was walking at [facility] and around her neighborhood. She would like to resume walking but [employer] has been cancelled.   Assessment: Patient presents today with improved A1c of 7.0 (vs 7.8). She is encouraged by this but continues to have significant room for improvement in both diet and exercise. She has attempted to maintain a healthy diet, but admits to struggling with this as well. Patient will attempt to work toward diet and exercise goals.  Plan: 1) Continue to maintain healthy dietary choices and focus on limiting snacks 2) Resume exercise with goal of three times per week, start with once weekly and work up 3) Continue testing, increase to testing at least before every meal. Remember to bolus before every meal. 4) Follow-up in 3 months on Tuesday Jan 26th @ 11:30 am

## 2014-05-28 NOTE — Progress Notes (Signed)
Patient ID: Jamie Mays, female   DOB: 08/30/1950, 63 y.o.   MRN: [id number] Reviewed: Agree with the documentation and management of our [place] Pharmacologist. 

## 2014-08-28 ENCOUNTER — Ambulatory Visit (INDEPENDENT_AMBULATORY_CARE_PROVIDER_SITE_OTHER): Payer: Self-pay | Admitting: Family Medicine

## 2014-08-28 VITALS — BP 128/74 | Ht 66.0 in | Wt 164.0 lb

## 2014-08-28 DIAGNOSIS — E109 Type 1 diabetes mellitus without complications: Secondary | ICD-10-CM

## 2014-08-28 NOTE — Progress Notes (Signed)
Patient presents today for 3 month diabetes follow-up as part of the employer-sponsored Link to Wellness program. Current diabetes regimen includes Novolog via pump. Patient also continues on daily ASA, ARB. Most recent MD follow-up was Oct 2015. Patient has a pending appt for follow-up next week. No major health changes or med changes at this visit. No med changes at this time. Of note, patient is currently experiencing plantar fasciitis and is being treated and is doing exercises to relieve this. She will follow-up with MD in 1 month for re-evaluation.  Diabetes Assessment: Type of Diabetes: Type 1; Sees Diabetes provider 3 times per year; MD managing Diabetes Dr. Jeanann Lewandowsky, endo; checks feet daily; uses glucometer; takes an aspirin a day; takes medications as prescribed; checks blood glucose 3-4 times a day Pt does an excellent job of SMBG and tests anywhere from 4-6 times per day. ; 14 day CBG average 208; Lowest CBG 78; Highest CBG 276; hypoglycemia frequency none Other Diabetes History:  14d avg - 208, 43 total, 12 ok, 31 hi, lows Current med regimen includes Novolog via pump. Patient continues using Medtronic pump and Bayer Contour Next meter with no issues. Patient does maintain good medication compliance. She is currently testing 1-4 times per day. Patient continues to have several hyperglycemic readings, as high as >270. Glucose has increased with most readings >200. She has not increased basal rates but will speak with MD about this at upcoming appt. Fasting glucose continues to be consistently elevated and patient will adjust overnight basal rates to correct this. She adjusts basal rates herself and is comfortable with this process. Hypoglycemia frequency is rare at this time. Patient does demonstrate appropriate correction of hypoglycemia when necessary. I have encouraged patient to test more often at least for a brief time in order to better adjust basal rates. Patient denies signs and  symptoms of neuropathy including numbness/tingling/burning and symptoms of foot infection. Patient had yearly eye exam in December which was negative for retinopathy. She also underwent cataract surgery this past fall and is doing well. Will have follow-up eye exam soon.   Lifestyle Factors: Diet - Patient admits that diet has deteriorated. Portion sizes have increased. However, on a positive note, patient is making an effort to avoid fried foods such as bojangles and other fast foods. Patient continues to be compliant with bolus dosing and carb coutning.  Exercise - no routine exercise at this time due to plantar fasciitis. Will follow-up with Dr. Nori Riis, family practice in 1 month. Patient would like to resume walking once able.   Plan: 1) Continue to maintain healthy dietary choices and focus on decreasing portion sizes 2) Resume physical activity once able 3) Continue testing, increase to testing at least before every meal.  4) Follow-up in 3 months on Tuesday May 17th @ 11:30 am

## 2014-09-12 NOTE — Progress Notes (Signed)
Patient ID: Jamie Mays, female   DOB: 10/27/1950, 64 y.o.   MRN: [id number] Reviewed: Agree with the documentation and management of our [place].

## 2014-09-27 ENCOUNTER — Telehealth: Payer: Self-pay | Admitting: *Deleted

## 2014-09-27 NOTE — Telephone Encounter (Signed)
LMOVM for pt to call us back. Margeaux Swantek, CMA  

## 2014-11-26 ENCOUNTER — Ambulatory Visit: Payer: 59 | Admitting: Pharmacist

## 2014-12-18 ENCOUNTER — Ambulatory Visit (INDEPENDENT_AMBULATORY_CARE_PROVIDER_SITE_OTHER): Payer: Self-pay | Admitting: Family Medicine

## 2014-12-18 ENCOUNTER — Encounter: Payer: Self-pay | Admitting: Pharmacist

## 2014-12-18 ENCOUNTER — Ambulatory Visit: Payer: 59 | Admitting: Pharmacist

## 2014-12-18 VITALS — BP 132/72 | Ht 66.0 in | Wt 164.0 lb

## 2014-12-18 DIAGNOSIS — E109 Type 1 diabetes mellitus without complications: Secondary | ICD-10-CM

## 2014-12-18 NOTE — Patient Instructions (Signed)
1)  Continue testing regularly and logging glucose for future adjustments 2)  Continue to make healthy dietary choices 3)  Attempt to increase physical activity 4)  It has been great having you in Link to Wellness, let me know what else we can do!  Great to see you today!

## 2014-12-18 NOTE — Progress Notes (Signed)
Subjective:  Patient is a 64 yo female with type 1 diabetes who presents today for 3 month follow-up as part of the employer-sponsored Link to Wellness program. Current diabetes regimen includes Novolog via pump. Patient also continues on daily ASA and ARB. Most recent MD follow-up was May. Patient has a pending appt for July. At previous appt patient had plantar fasciitis and reports it continues to cause discomfort but has improved.  Of note, patient [place] discontinue [place]W [place]  This will serve as her final LTW visit.    Diabetes Assessment:  Changes to diabetes regimen include basal rate adjustment.  Patient is comfortable adjusting her own basal rates and MD has given instruction to increase 10pm-6am basal rate by 0.1 unit/hr each week until fasting glucose at goal.  Patient does maintain good medication compliance.  She misses insulin only on rare occasion.  She continues using medtronic pump and although she hates the tubing, she has had no issues otherwise.  Most recent A1c was 7.7% (prev 7.0) which is exceeding goal of less than 7%.  Weight remains unchanged since last visit.  Patient did not bring meter today but is currently testing 3-4 times per day, up to 6 times if needed. Glucose monitoring occurs fasting, before lunch and supper, and at bedtime.  MD has asked pt to keep a log of bedtime readings and fasting readings for better basal dose adjustment at next visit.  Hypoglycemia is rare. Patient does demonstrate appropriate correction of hypoglycemia when needed.  Patient denies signs and symptoms of neuropathy including numbness/tingling/burning and denies symptoms of foot infection.  Patient is up to date on eye and dental exams.      Lifestyle Assessment:  Exercise - No routine exercise, plantar fasciitis has limited ability to exercise,  In preparation for upcoming move, pt and her husband are painting and upgrading their home to prepare for rental.     Diet - Patient admits that diet continues to need improvement.  Patient is limiting fried foods and has reduced eating out to twice weekly.    Plan and Goals: 1)  Continue testing regularly and logging glucose for future adjustments 2)  Continue to make healthy dietary choices 3)  Attempt to increase physical activity 4)  It has been great having you in Link to Wellness, let me know what else we can do!   Tilman Neat, PharmD [facility]  [phone]

## 2014-12-31 NOTE — Progress Notes (Signed)
ATTENDING PHYSICIAN NOTE:Link to wellness Program I have reviewed the chart and agree with the plan as detailed above. Dorcas Mcmurray MD Pager [phone]

## 2015-03-26 ENCOUNTER — Ambulatory Visit (INDEPENDENT_AMBULATORY_CARE_PROVIDER_SITE_OTHER): Payer: 59 | Admitting: Family Medicine

## 2015-03-26 ENCOUNTER — Encounter: Payer: Self-pay | Admitting: Family Medicine

## 2015-03-26 ENCOUNTER — Other Ambulatory Visit (HOSPITAL_COMMUNITY)
Admission: RE | Admit: 2015-03-26 | Discharge: 2015-03-26 | Disposition: A | Discharge status: Home / Self Care | Payer: 59 | Source: Ambulatory Visit | Attending: Family Medicine | Admitting: Family Medicine

## 2015-03-26 VITALS — BP 147/69 | HR 67 | Temp 98.2°F | Ht 66.0 in | Wt 163.4 lb

## 2015-03-26 DIAGNOSIS — Z124 Encounter for screening for malignant neoplasm of cervix: Secondary | ICD-10-CM | POA: Diagnosis not present

## 2015-03-26 DIAGNOSIS — E039 Hypothyroidism, unspecified: Secondary | ICD-10-CM | POA: Diagnosis not present

## 2015-03-26 DIAGNOSIS — Z1151 Encounter for screening for human papillomavirus (HPV): Secondary | ICD-10-CM | POA: Diagnosis present

## 2015-03-26 DIAGNOSIS — Z0000 Encounter for general adult medical examination without abnormal findings: Secondary | ICD-10-CM | POA: Diagnosis not present

## 2015-03-26 DIAGNOSIS — Z01419 Encounter for gynecological examination (general) (routine) without abnormal findings: Secondary | ICD-10-CM | POA: Insufficient documentation

## 2015-03-26 MED ORDER — LEVOTHYROXINE SODIUM 75 MCG PO TABS: 75.0000 ug | ORAL_TABLET | Freq: Every day | ORAL | Status: DC

## 2015-03-26 NOTE — Patient Instructions (Signed)
I have sent a new prescription for generic levothyroxine into the pharmacy for you. Since this is a change in the manufacture, you should have your thyroid level checked in 6 weeks. He can have that done here, or with her endocrinologist. I will send you a note about your Pap smear. Enjoy the  [facility] and your retirement! Great to see you!

## 2015-03-27 LAB — CYTOLOGY - PAP

## 2015-03-28 DIAGNOSIS — E039 Hypothyroidism, unspecified: Secondary | ICD-10-CM | POA: Insufficient documentation

## 2015-03-28 DIAGNOSIS — Z01419 Encounter for gynecological examination (general) (routine) without abnormal findings: Secondary | ICD-10-CM | POA: Insufficient documentation

## 2015-03-28 NOTE — Progress Notes (Signed)
   Subjective:    Patient ID: Jamie Mays, female    DOB: [birth year], 64 y.o.   MRN: [id number]  HPI Here for checkup. Wants to get her Pap smear and other preventive needs updated. Planning to retire in the next couple of [place]  #1. Still has intermittent problems with vertigo. Nothing currently. #2. Continues follow-up with her endocrinologist regarding her diabetes mellitus and continues on the insulin pump. Her endocrinologist is checking blood work at least twice a year including her kidney function. #3. Hypertension: No chest pain, no shortness of breath. No headaches. Her endocrinologist has been providing her with blood pressure medicine and lab work.   Review of Systems  Constitutional: Negative for fever, activity change, fatigue and unexpected weight change.  HENT: Negative for ear pain.   Eyes: Negative for pain and visual disturbance.  Respiratory: Negative for chest tightness and shortness of breath.   Cardiovascular: Negative for chest pain and leg swelling.  Gastrointestinal: Negative for nausea, abdominal pain, diarrhea and constipation.  Genitourinary: Negative for dysuria and vaginal bleeding.  Musculoskeletal: Negative for arthralgias.  Neurological: Negative for weakness and light-headedness.  Psychiatric/Behavioral: Negative for dysphoric mood and decreased concentration.       Objective:   Physical Exam  Constitutional: She is oriented to person, place, and time. She appears well-developed and well-nourished.  HENT:  Head: Normocephalic.  Right Ear: External ear normal.  Left Ear: External ear normal.  Nose: Nose normal.  Mouth/Throat: Oropharynx is clear and moist.  Eyes: Conjunctivae and EOM are normal. Pupils are equal, round, and reactive to light.  Neck: Normal range of motion. Neck supple. No JVD present. No thyromegaly present.  Cardiovascular: Normal rate, regular rhythm and normal heart sounds.   No murmur  heard. Pulmonary/Chest: Effort normal and breath sounds normal.  Abdominal: Soft. Bowel sounds are normal.  Genitourinary:  Externally mild atrophy. No adnexal masses or tenderness. Uterus is normal in size and position. Cervix appears normal.  Lymphadenopathy:    She has no cervical adenopathy.  Neurological: She is alert and oriented to person, place, and time. She has normal reflexes. Coordination normal.  Skin: No rash noted.  Psychiatric: She has a normal mood and affect. Her behavior is normal. Judgment and thought content normal.          Assessment & Plan:

## 2015-03-28 NOTE — Assessment & Plan Note (Signed)
With her upcoming retirement she wants to switch his many of her medicines generic as possible. I gave her prescription for her Synthroid today in the generic form. She has an appointment to follow-up with her endocrinologist in a couple of months and she should get blood work then to check her levels on the new medicine.

## 2015-03-28 NOTE — Assessment & Plan Note (Signed)
Updated her Pap smear today. We'll send her note about that. Her endocrinologist is following kidney function, blood sugar, A1c and hypertension.

## 2015-04-02 ENCOUNTER — Encounter: Payer: Self-pay | Admitting: Family Medicine

## 2015-06-25 ENCOUNTER — Telehealth: Payer: Self-pay | Admitting: Family Medicine

## 2015-06-25 NOTE — Telephone Encounter (Signed)
Pt calling and states that specific labs need to be drawn for medicare. She states that a fax will be sent for the provider to review and determine which labs need to be ordered. Thank you, Fonda Kinder, ASA

## 2015-06-27 NOTE — Telephone Encounter (Signed)
I  Will await the fax Jamie Mays

## 2015-07-08 ENCOUNTER — Ambulatory Visit: Payer: Self-pay | Admitting: Family Medicine

## 2015-09-02 ENCOUNTER — Telehealth: Payer: Self-pay | Admitting: *Deleted

## 2015-09-02 NOTE — Telephone Encounter (Signed)
Called patient to offer flu vaccine, however, VM picked up.  Left message on patient's home and mobile voice mail to return call. Velora Heckler, RN

## 2016-03-29 ENCOUNTER — Other Ambulatory Visit: Payer: Self-pay | Admitting: *Deleted

## 2016-03-29 DIAGNOSIS — E039 Hypothyroidism, unspecified: Secondary | ICD-10-CM

## 2016-03-29 MED ORDER — LEVOTHYROXINE SODIUM 75 MCG PO TABS: 75.0000 ug | ORAL_TABLET | Freq: Every day | ORAL | 0 refills | Status: AC

## 2016-04-23 ENCOUNTER — Other Ambulatory Visit: Payer: Self-pay | Admitting: Family Medicine

## 2016-04-23 DIAGNOSIS — E039 Hypothyroidism, unspecified: Secondary | ICD-10-CM

## 2016-04-28 ENCOUNTER — Other Ambulatory Visit: Payer: Self-pay | Admitting: Family Medicine

## 2016-04-28 DIAGNOSIS — E039 Hypothyroidism, unspecified: Secondary | ICD-10-CM

## 2016-07-21 DIAGNOSIS — E119 Type 2 diabetes mellitus without complications: Secondary | ICD-10-CM | POA: Diagnosis not present

## 2016-07-21 DIAGNOSIS — H35341 Macular cyst, hole, or pseudohole, right eye: Secondary | ICD-10-CM | POA: Diagnosis not present

## 2016-08-06 ENCOUNTER — Telehealth: Payer: Self-pay | Admitting: *Deleted

## 2016-08-06 NOTE — Telephone Encounter (Signed)
Called to offer AWV. Patient states she has moved to [facility], [place] and has a PCP there.  Hubbard Hartshorn, RN, BSN

## 2016-08-24 DIAGNOSIS — E1165 Type 2 diabetes mellitus with hyperglycemia: Secondary | ICD-10-CM | POA: Diagnosis not present

## 2016-08-24 DIAGNOSIS — E785 Hyperlipidemia, unspecified: Secondary | ICD-10-CM | POA: Diagnosis not present

## 2016-08-31 DIAGNOSIS — E785 Hyperlipidemia, unspecified: Secondary | ICD-10-CM | POA: Diagnosis not present

## 2016-08-31 DIAGNOSIS — I10 Essential (primary) hypertension: Secondary | ICD-10-CM | POA: Diagnosis not present

## 2016-08-31 DIAGNOSIS — E1065 Type 1 diabetes mellitus with hyperglycemia: Secondary | ICD-10-CM | POA: Diagnosis not present

## 2016-11-22 DIAGNOSIS — E559 Vitamin D deficiency, unspecified: Secondary | ICD-10-CM | POA: Diagnosis not present

## 2016-11-22 DIAGNOSIS — D751 Secondary polycythemia: Secondary | ICD-10-CM | POA: Diagnosis not present

## 2016-11-22 DIAGNOSIS — E039 Hypothyroidism, unspecified: Secondary | ICD-10-CM | POA: Diagnosis not present

## 2016-11-22 DIAGNOSIS — E785 Hyperlipidemia, unspecified: Secondary | ICD-10-CM | POA: Diagnosis not present

## 2016-11-22 DIAGNOSIS — I10 Essential (primary) hypertension: Secondary | ICD-10-CM | POA: Diagnosis not present

## 2016-11-22 DIAGNOSIS — Z1212 Encounter for screening for malignant neoplasm of rectum: Secondary | ICD-10-CM | POA: Diagnosis not present

## 2016-11-22 DIAGNOSIS — E1165 Type 2 diabetes mellitus with hyperglycemia: Secondary | ICD-10-CM | POA: Diagnosis not present

## 2016-12-23 DIAGNOSIS — R319 Hematuria, unspecified: Secondary | ICD-10-CM | POA: Diagnosis not present

## 2016-12-23 DIAGNOSIS — Z1231 Encounter for screening mammogram for malignant neoplasm of breast: Secondary | ICD-10-CM | POA: Diagnosis not present

## 2016-12-23 DIAGNOSIS — E785 Hyperlipidemia, unspecified: Secondary | ICD-10-CM | POA: Diagnosis not present

## 2016-12-23 DIAGNOSIS — E1065 Type 1 diabetes mellitus with hyperglycemia: Secondary | ICD-10-CM | POA: Diagnosis not present

## 2016-12-23 DIAGNOSIS — Z6825 Body mass index (BMI) 25.0-25.9, adult: Secondary | ICD-10-CM | POA: Diagnosis not present

## 2016-12-23 DIAGNOSIS — E039 Hypothyroidism, unspecified: Secondary | ICD-10-CM | POA: Diagnosis not present

## 2016-12-23 DIAGNOSIS — I10 Essential (primary) hypertension: Secondary | ICD-10-CM | POA: Diagnosis not present

## 2016-12-23 DIAGNOSIS — E559 Vitamin D deficiency, unspecified: Secondary | ICD-10-CM | POA: Diagnosis not present

## 2016-12-23 DIAGNOSIS — Z0000 Encounter for general adult medical examination without abnormal findings: Secondary | ICD-10-CM | POA: Diagnosis not present

## 2017-02-23 DIAGNOSIS — H43812 Vitreous degeneration, left eye: Secondary | ICD-10-CM | POA: Diagnosis not present

## 2017-02-23 DIAGNOSIS — Z794 Long term (current) use of insulin: Secondary | ICD-10-CM | POA: Diagnosis not present

## 2017-02-23 DIAGNOSIS — H00023 Hordeolum internum right eye, unspecified eyelid: Secondary | ICD-10-CM | POA: Diagnosis not present

## 2017-02-23 DIAGNOSIS — H4312 Vitreous hemorrhage, left eye: Secondary | ICD-10-CM | POA: Diagnosis not present

## 2017-02-23 DIAGNOSIS — E119 Type 2 diabetes mellitus without complications: Secondary | ICD-10-CM | POA: Diagnosis not present

## 2017-03-04 DIAGNOSIS — E1065 Type 1 diabetes mellitus with hyperglycemia: Secondary | ICD-10-CM | POA: Diagnosis not present

## 2017-03-04 DIAGNOSIS — E1165 Type 2 diabetes mellitus with hyperglycemia: Secondary | ICD-10-CM | POA: Diagnosis not present

## 2017-03-31 DIAGNOSIS — H4312 Vitreous hemorrhage, left eye: Secondary | ICD-10-CM | POA: Diagnosis not present

## 2017-03-31 DIAGNOSIS — E1065 Type 1 diabetes mellitus with hyperglycemia: Secondary | ICD-10-CM | POA: Diagnosis not present

## 2017-03-31 DIAGNOSIS — E039 Hypothyroidism, unspecified: Secondary | ICD-10-CM | POA: Diagnosis not present

## 2017-03-31 DIAGNOSIS — E785 Hyperlipidemia, unspecified: Secondary | ICD-10-CM | POA: Diagnosis not present

## 2017-03-31 DIAGNOSIS — H43812 Vitreous degeneration, left eye: Secondary | ICD-10-CM | POA: Diagnosis not present

## 2017-04-06 DIAGNOSIS — E039 Hypothyroidism, unspecified: Secondary | ICD-10-CM | POA: Diagnosis not present

## 2017-04-06 DIAGNOSIS — E1065 Type 1 diabetes mellitus with hyperglycemia: Secondary | ICD-10-CM | POA: Diagnosis not present

## 2017-04-06 DIAGNOSIS — I10 Essential (primary) hypertension: Secondary | ICD-10-CM | POA: Diagnosis not present

## 2017-04-06 DIAGNOSIS — Z6825 Body mass index (BMI) 25.0-25.9, adult: Secondary | ICD-10-CM | POA: Diagnosis not present

## 2017-04-06 DIAGNOSIS — E785 Hyperlipidemia, unspecified: Secondary | ICD-10-CM | POA: Diagnosis not present

## 2017-04-14 DIAGNOSIS — E1065 Type 1 diabetes mellitus with hyperglycemia: Secondary | ICD-10-CM | POA: Diagnosis not present

## 2017-04-21 DIAGNOSIS — E1065 Type 1 diabetes mellitus with hyperglycemia: Secondary | ICD-10-CM | POA: Diagnosis not present

## 2017-04-21 DIAGNOSIS — E785 Hyperlipidemia, unspecified: Secondary | ICD-10-CM | POA: Diagnosis not present

## 2017-04-21 DIAGNOSIS — I10 Essential (primary) hypertension: Secondary | ICD-10-CM | POA: Diagnosis not present

## 2017-04-21 DIAGNOSIS — Z6825 Body mass index (BMI) 25.0-25.9, adult: Secondary | ICD-10-CM | POA: Diagnosis not present

## 2017-07-29 DIAGNOSIS — Z961 Presence of intraocular lens: Secondary | ICD-10-CM | POA: Diagnosis not present

## 2017-07-29 DIAGNOSIS — E103292 Type 1 diabetes mellitus with mild nonproliferative diabetic retinopathy without macular edema, left eye: Secondary | ICD-10-CM | POA: Diagnosis not present

## 2017-07-29 DIAGNOSIS — H40013 Open angle with borderline findings, low risk, bilateral: Secondary | ICD-10-CM | POA: Diagnosis not present

## 2017-07-29 DIAGNOSIS — H43811 Vitreous degeneration, right eye: Secondary | ICD-10-CM | POA: Diagnosis not present

## 2017-08-26 DIAGNOSIS — Z23 Encounter for immunization: Secondary | ICD-10-CM | POA: Diagnosis not present

## 2017-08-26 DIAGNOSIS — K760 Fatty (change of) liver, not elsewhere classified: Secondary | ICD-10-CM | POA: Diagnosis not present

## 2017-08-26 DIAGNOSIS — I10 Essential (primary) hypertension: Secondary | ICD-10-CM | POA: Diagnosis not present

## 2017-08-26 DIAGNOSIS — E109 Type 1 diabetes mellitus without complications: Secondary | ICD-10-CM | POA: Diagnosis not present

## 2017-08-26 DIAGNOSIS — E039 Hypothyroidism, unspecified: Secondary | ICD-10-CM | POA: Diagnosis not present

## 2017-08-26 DIAGNOSIS — R7989 Other specified abnormal findings of blood chemistry: Secondary | ICD-10-CM | POA: Diagnosis not present

## 2017-08-31 DIAGNOSIS — E875 Hyperkalemia: Secondary | ICD-10-CM | POA: Diagnosis not present

## 2017-09-05 DIAGNOSIS — E042 Nontoxic multinodular goiter: Secondary | ICD-10-CM | POA: Diagnosis not present

## 2017-09-05 DIAGNOSIS — E039 Hypothyroidism, unspecified: Secondary | ICD-10-CM | POA: Diagnosis not present

## 2017-09-05 DIAGNOSIS — E109 Type 1 diabetes mellitus without complications: Secondary | ICD-10-CM | POA: Diagnosis not present

## 2017-10-17 DIAGNOSIS — E039 Hypothyroidism, unspecified: Secondary | ICD-10-CM | POA: Diagnosis not present

## 2017-10-17 DIAGNOSIS — E109 Type 1 diabetes mellitus without complications: Secondary | ICD-10-CM | POA: Diagnosis not present

## 2017-10-17 DIAGNOSIS — I10 Essential (primary) hypertension: Secondary | ICD-10-CM | POA: Diagnosis not present

## 2017-10-24 DIAGNOSIS — E042 Nontoxic multinodular goiter: Secondary | ICD-10-CM | POA: Diagnosis not present

## 2017-10-24 DIAGNOSIS — E109 Type 1 diabetes mellitus without complications: Secondary | ICD-10-CM | POA: Diagnosis not present

## 2017-10-24 DIAGNOSIS — E039 Hypothyroidism, unspecified: Secondary | ICD-10-CM | POA: Diagnosis not present

## 2017-11-06 ENCOUNTER — Emergency Department (HOSPITAL_COMMUNITY): Payer: Medicare Other

## 2017-11-06 ENCOUNTER — Encounter (HOSPITAL_COMMUNITY): Payer: Self-pay | Admitting: Emergency Medicine

## 2017-11-06 ENCOUNTER — Observation Stay (HOSPITAL_COMMUNITY)
Admission: EM | Admit: 2017-11-06 | Discharge: 2017-11-07 | Disposition: A | Discharge status: Home / Self Care | Payer: Medicare Other | Attending: Internal Medicine | Admitting: Internal Medicine

## 2017-11-06 ENCOUNTER — Other Ambulatory Visit: Payer: Self-pay

## 2017-11-06 DIAGNOSIS — R55 Syncope and collapse: Secondary | ICD-10-CM | POA: Insufficient documentation

## 2017-11-06 DIAGNOSIS — Z8249 Family history of ischemic heart disease and other diseases of the circulatory system: Secondary | ICD-10-CM | POA: Diagnosis not present

## 2017-11-06 DIAGNOSIS — Z87891 Personal history of nicotine dependence: Secondary | ICD-10-CM | POA: Diagnosis not present

## 2017-11-06 DIAGNOSIS — I2089 Other forms of angina pectoris: Secondary | ICD-10-CM | POA: Diagnosis present

## 2017-11-06 DIAGNOSIS — E785 Hyperlipidemia, unspecified: Secondary | ICD-10-CM | POA: Insufficient documentation

## 2017-11-06 DIAGNOSIS — M1990 Unspecified osteoarthritis, unspecified site: Secondary | ICD-10-CM | POA: Diagnosis not present

## 2017-11-06 DIAGNOSIS — Z9641 Presence of insulin pump (external) (internal): Secondary | ICD-10-CM | POA: Diagnosis not present

## 2017-11-06 DIAGNOSIS — R531 Weakness: Secondary | ICD-10-CM | POA: Diagnosis not present

## 2017-11-06 DIAGNOSIS — I4510 Unspecified right bundle-branch block: Secondary | ICD-10-CM | POA: Diagnosis not present

## 2017-11-06 DIAGNOSIS — Z794 Long term (current) use of insulin: Secondary | ICD-10-CM | POA: Diagnosis not present

## 2017-11-06 DIAGNOSIS — R0602 Shortness of breath: Secondary | ICD-10-CM | POA: Insufficient documentation

## 2017-11-06 DIAGNOSIS — E119 Type 2 diabetes mellitus without complications: Secondary | ICD-10-CM | POA: Diagnosis not present

## 2017-11-06 DIAGNOSIS — R0789 Other chest pain: Secondary | ICD-10-CM | POA: Diagnosis not present

## 2017-11-06 DIAGNOSIS — Z7982 Long term (current) use of aspirin: Secondary | ICD-10-CM | POA: Insufficient documentation

## 2017-11-06 DIAGNOSIS — R5383 Other fatigue: Secondary | ICD-10-CM | POA: Diagnosis not present

## 2017-11-06 DIAGNOSIS — R079 Chest pain, unspecified: Secondary | ICD-10-CM | POA: Diagnosis present

## 2017-11-06 DIAGNOSIS — E039 Hypothyroidism, unspecified: Secondary | ICD-10-CM | POA: Diagnosis present

## 2017-11-06 DIAGNOSIS — I208 Other forms of angina pectoris: Principal | ICD-10-CM | POA: Insufficient documentation

## 2017-11-06 DIAGNOSIS — I10 Essential (primary) hypertension: Secondary | ICD-10-CM | POA: Diagnosis not present

## 2017-11-06 LAB — I-STAT TROPONIN, ED: Troponin i, poc: 0 ng/mL (ref 0.00–0.08)

## 2017-11-06 LAB — CBC
HCT: 44.2 % (ref 36.0–46.0)
HCT: 42.1 % (ref 36.0–46.0)
Hemoglobin: 15.4 g/dL — ABNORMAL HIGH (ref 12.0–15.0)
Hemoglobin: 14.8 g/dL (ref 12.0–15.0)
MCH: 30.6 pg (ref 26.0–34.0)
MCH: 30.5 pg (ref 26.0–34.0)
MCHC: 34.8 g/dL (ref 30.0–36.0)
MCHC: 35.2 g/dL (ref 30.0–36.0)
MCV: 87.7 fL (ref 78.0–100.0)
MCV: 86.8 fL (ref 78.0–100.0)
PLATELETS: 195 10*3/uL (ref 150–400)
Platelets: 176 K/uL (ref 150–400)
RBC: 5.04 MIL/uL (ref 3.87–5.11)
RBC: 4.85 MIL/uL (ref 3.87–5.11)
RDW: 13.1 % (ref 11.5–15.5)
RDW: 12.8 % (ref 11.5–15.5)
WBC: 5.3 10*3/uL (ref 4.0–10.5)
WBC: 5.8 K/uL (ref 4.0–10.5)

## 2017-11-06 LAB — BASIC METABOLIC PANEL
Anion gap: 9 (ref 5–15)
BUN: 22 mg/dL — AB (ref 6–20)
CHLORIDE: 105 mmol/L (ref 101–111)
CO2: 25 mmol/L (ref 22–32)
CREATININE: 0.84 mg/dL (ref 0.44–1.00)
Calcium: 9.7 mg/dL (ref 8.9–10.3)
GFR calc Af Amer: >60 mL/min (ref >60)
GFR calc non Af Amer: >60 mL/min (ref >60)
Glucose, Bld: 183 mg/dL — ABNORMAL HIGH (ref 65–99)
Potassium: 4.6 mmol/L (ref 3.5–5.1)
SODIUM: 139 mmol/L (ref 135–145)

## 2017-11-06 LAB — CREATININE, SERUM
Creatinine, Ser: 0.66 mg/dL (ref 0.44–1.00)
GFR calc Af Amer: >60 mL/min (ref >60)

## 2017-11-06 LAB — URINALYSIS, ROUTINE W REFLEX MICROSCOPIC
Bacteria, UA: NONE SEEN
Bilirubin Urine: NEGATIVE
Glucose, UA: NEGATIVE mg/dL
Ketones, ur: NEGATIVE mg/dL
Nitrite: NEGATIVE
Protein, ur: NEGATIVE mg/dL
SPECIFIC GRAVITY, URINE: 1.028 (ref 1.005–1.030)
pH: 5 (ref 5.0–8.0)

## 2017-11-06 LAB — TROPONIN I
Troponin I: <0.03 ng/mL
Troponin I: <0.03 ng/mL (ref <0.03)

## 2017-11-06 LAB — GLUCOSE, CAPILLARY: Glucose-Capillary: 107 mg/dL — ABNORMAL HIGH (ref 65–99)

## 2017-11-06 LAB — CBG MONITORING, ED
GLUCOSE-CAPILLARY: 144 mg/dL — AB (ref 65–99)
Glucose-Capillary: 96 mg/dL (ref 65–99)

## 2017-11-06 LAB — BRAIN NATRIURETIC PEPTIDE: B Natriuretic Peptide: 110 pg/mL — ABNORMAL HIGH (ref 0.0–100.0)

## 2017-11-06 MED ORDER — ONDANSETRON HCL 4 MG/2ML IJ SOLN: 4.0000 mg | Freq: Four times a day (QID) | INTRAMUSCULAR | Status: DC | PRN

## 2017-11-06 MED ORDER — INSULIN PUMP
Freq: Three times a day (TID) | SUBCUTANEOUS | Status: DC
  Administered 2017-11-06 – 2017-11-07 (×3): via SUBCUTANEOUS
  Filled 2017-11-06: qty 1

## 2017-11-06 MED ORDER — LEVOTHYROXINE SODIUM 75 MCG PO TABS
75.0000 ug | ORAL_TABLET | Freq: Every day | ORAL | Status: DC
  Administered 2017-11-07: 75 ug via ORAL
  Filled 2017-11-06 (×2): qty 1

## 2017-11-06 MED ORDER — ACETAMINOPHEN 325 MG PO TABS: 650.0000 mg | ORAL_TABLET | Freq: Four times a day (QID) | ORAL | Status: DC | PRN

## 2017-11-06 MED ORDER — ASPIRIN EC 81 MG PO TBEC
81.0000 mg | DELAYED_RELEASE_TABLET | Freq: Every day | ORAL | Status: DC
  Administered 2017-11-07: 81 mg via ORAL
  Filled 2017-11-06: qty 1

## 2017-11-06 MED ORDER — ASPIRIN 81 MG PO CHEW: 324.0000 mg | CHEWABLE_TABLET | Freq: Once | ORAL | Status: DC

## 2017-11-06 MED ORDER — ASPIRIN 81 MG PO CHEW: 324.0000 mg | CHEWABLE_TABLET | ORAL | Status: AC

## 2017-11-06 MED ORDER — IRBESARTAN 300 MG PO TABS
150.0000 mg | ORAL_TABLET | Freq: Every day | ORAL | Status: DC
  Administered 2017-11-07: 150 mg via ORAL
  Filled 2017-11-06 (×2): qty 1

## 2017-11-06 MED ORDER — ASPIRIN 300 MG RE SUPP: 300.0000 mg | RECTAL | Status: AC

## 2017-11-06 MED ORDER — ENOXAPARIN SODIUM 40 MG/0.4ML ~~LOC~~ SOLN
40.0000 mg | SUBCUTANEOUS | Status: DC
Start: 2017-11-06 — End: 2017-11-07
  Administered 2017-11-06: 40 mg via SUBCUTANEOUS
  Filled 2017-11-06 (×2): qty 0.4

## 2017-11-06 MED ORDER — NITROGLYCERIN 0.4 MG SL SUBL: 0.4000 mg | SUBLINGUAL_TABLET | SUBLINGUAL | Status: DC | PRN

## 2017-11-06 NOTE — ED Provider Notes (Signed)
[facility] EMERGENCY DEPARTMENT Provider Note   CSN: [id number] Arrival date & time: 11/06/17  0537     History   Chief Complaint Chief Complaint  Patient presents with  . Shortness of Breath  . Near Syncope    HPI Jamie Mays is a 67 y.o. female.  HPI 67 year old female with past medical history of hypertension, diabetes, here with shortness of breath.  The patient states that she awoke from sleep at around 4:56 AM today with shortness of breath and a mild dull chest pressure.  She had associated general fatigue.  This seems to have happened intermittently over the last several weeks, often when she is waking up, but then resolves.  She was able to work around the house without difficulty yesterday.  She states she had a dull, aching, substernal left-sided chest pressure with associated shortness of breath.  No alleviating factors.  She has not tried anything for this.  No focal numbness or weakness.  No other medical complaints.  Past Medical History:  Diagnosis Date  . Arthritis   . Diabetes mellitus   . Family history of MI (myocardial infarction)   . Hypertension   . PVC (premature ventricular contraction)   . Smoker   . Syncope     Patient Active Problem List   Diagnosis Date Noted  . Angina decubitus ([place]) 11/06/2017  . Dyslipidemia 11/06/2017  . Well woman exam 03/28/2015  . Hypothyroidism 03/28/2015  . Labyrinthitis 07/23/2011  . Hip arthritis 05/10/2011  . Left shoulder pain 05/10/2011  . Fatty liver 02/04/2011  . Hip pain, left 01/22/2011  . HYPERLIPIDEMIA 08/06/2008  . Adhesive capsulitis of shoulder 08/06/2008  . Diabetes type 2, controlled ([place]) 04/30/2006  . CARPAL TUNNEL SYNDROME 04/30/2006  . Essential hypertension 04/30/2006  . TOBACCO USE, QUIT 04/30/2006    Past Surgical History:  Procedure Laterality Date  . CARDIAC CATHETERIZATION  2007   neg  . CESAREAN SECTION       OB History   None      Home Medications      Prior to Admission medications   Medication Sig Start Date End Date Taking? Authorizing Provider  acetaminophen (TYLENOL) 325 MG tablet Take 650 mg by mouth every 6 (six) hours as needed for mild pain.   Yes [provider]  aspirin 81 MG tablet Take 81 mg by mouth daily.     Yes [provider]  cycloSPORINE (RESTASIS) 0.05 % ophthalmic emulsion Place 1 drop into both eyes 2 (two) times daily.   Yes [provider]  glucose blood test strip 1 each by Other route 6 (six) times daily. Use as instructed    Yes [provider]  hydrochlorothiazide (HYDRODIURIL) 12.5 MG tablet Take 12.5 mg by mouth daily.   Yes [provider]  ibuprofen (ADVIL,MOTRIN) 200 MG tablet Take 200 mg by mouth every 6 (six) hours as needed for moderate pain.   Yes [provider]  insulin aspart (NOVOLOG) 100 UNIT/ML injection Inject into the skin See admin instructions. Via insulin pump as directed   Yes [provider]  irbesartan (AVAPRO) 150 MG tablet Take 150 mg by mouth daily.   Yes [provider]  levothyroxine (SYNTHROID, LEVOTHROID) 75 MCG tablet Take 1 tablet (75 mcg total) by mouth daily. 03/29/16  Yes Dickie La, MD  Menthol, Topical Analgesic, (BIOFREEZE EX) Apply 1 application topically daily as needed (shoulder/hip pain).   Yes [provider]  Omega-3 Fatty Acids (  FISH OIL ADULT GUMMIES PO) Take 1 each by mouth daily.   Yes [provider]  TURMERIC PO Take 1 tablet by mouth daily.   Yes [provider]  cyclobenzaprine (FLEXERIL) 5 MG tablet Take 1/2 or 1 tablet at bed time prn for leg cramps Patient not taking: Reported on 11/06/2017 02/10/11   Dickie La, MD    Family History Family History  Problem Relation Age of Onset  . Coronary artery disease Mother   . Kidney disease Mother   . Diabetes Mother   . Heart disease Mother        heart attack  . Hypertension Mother   . Diabetes Sister   .  Diabetes Brother   . Cancer Brother        lung cancer  . Diabetes Maternal Grandmother   . Cancer Brother 42       lungs, brain, chest wall, liver cancer, stage 4  . Cancer Other 70       lung , brain    Social History Social History   Tobacco Use  . Smoking status: Former Smoker    Last attempt to quit: 07/12/1981    Years since quitting: 36.3  . Smokeless tobacco: Never Used  Substance Use Topics  . Alcohol use: Yes    Alcohol/week: 0.0 oz    Comment: glass wine every 6 mths.  . Drug use: No     Allergies   Doxycycline and Lisinopril   Review of Systems Review of Systems  Constitutional: Positive for fatigue.  Respiratory: Positive for shortness of breath.   Cardiovascular: Positive for chest pain.  All other systems reviewed and are negative.    Physical Exam Updated Vital Signs BP 132/60   Pulse (!) 53   Temp 97.7 F (36.5 C) (Oral)   Resp 16   Ht 5\' 5"  (1.651 m)   Wt 70.3 kg (155 lb)   SpO2 100%   BMI 25.79 kg/m   Physical Exam  Constitutional: She is oriented to person, place, and time. She appears well-developed and well-nourished. No distress.  HENT:  Head: Normocephalic and atraumatic.  Eyes: Conjunctivae are normal.  Neck: Neck supple.  Cardiovascular: Normal rate, regular rhythm and normal heart sounds. Exam reveals no friction rub.  No murmur heard. Pulmonary/Chest: Effort normal and breath sounds normal. No respiratory distress. She has no wheezes. She has no rales.  Abdominal: She exhibits no distension.  Musculoskeletal: She exhibits no edema.  Neurological: She is alert and oriented to person, place, and time. She exhibits normal muscle tone.  Skin: Skin is warm. Capillary refill takes less than 2 seconds.  Psychiatric: She has a normal mood and affect.  Nursing note and vitals reviewed.    ED Treatments / Results  Labs (all labs ordered are listed, but only abnormal results are displayed) Labs Reviewed  BASIC METABOLIC PANEL -  Abnormal; Notable for the following components:      Result Value   Glucose, Bld 183 (*)    BUN 22 (*)    All other components within normal limits  CBC - Abnormal; Notable for the following components:   Hemoglobin 15.4 (*)    All other components within normal limits  URINALYSIS, ROUTINE W REFLEX MICROSCOPIC - Abnormal; Notable for the following components:   APPearance HAZY (*)    Hgb urine dipstick SMALL (*)    Leukocytes, UA SMALL (*)    All other components within normal limits  BRAIN NATRIURETIC PEPTIDE -  Abnormal; Notable for the following components:   B Natriuretic Peptide 110.0 (*)    All other components within normal limits  CBG MONITORING, ED - Abnormal; Notable for the following components:   Glucose-Capillary 144 (*)    All other components within normal limits  CBC  CREATININE, SERUM  TROPONIN I  TROPONIN I  TROPONIN I  I-STAT TROPONIN, ED  CBG MONITORING, ED    EKG EKG Interpretation  Date/Time:  Sunday November 06 2017 05:43:54 EDT Ventricular Rate:  64 PR Interval:  146 QRS Duration: 104 QT Interval:  450 QTC Calculation: 464 R Axis:   118 Text Interpretation:  Normal sinus rhythm Right axis deviation Incomplete right bundle branch block Right ventricular hypertrophy Abnormal ECG Since last EKG, there is now evidence of RAD and RBBB Otherwise no significant change Confirmed by Shonteria Abeln ([id number]) on 11/06/2017 8:47:42 AM   Radiology Dg Chest 2 View  Result Date: 11/06/2017 CLINICAL DATA:  Acute onset shortness of breath this morning. Presyncope. EXAM: CHEST - 2 VIEW COMPARISON:  01/12/2006 FINDINGS: The heart size and mediastinal contours are within normal limits. Both lungs are clear. The visualized skeletal structures are unremarkable. IMPRESSION: No active cardiopulmonary disease. Electronically Signed   By: John  Stahl M.D.   On: 11/06/2017 07:07    Procedures Procedures (including critical care time)  Medications Ordered in ED Medications    aspirin chewable tablet 324 mg (324 mg Oral Not Given 11/06/17 1152)  acetaminophen (TYLENOL) tablet 650 mg (has no administration in time range)  aspirin EC tablet 81 mg (has no administration in time range)  irbesartan (AVAPRO) tablet 150 mg (150 mg Oral Not Given 11/06/17 1519)  levothyroxine (SYNTHROID, LEVOTHROID) tablet 75 mcg (75 mcg Oral Not Given 11/06/17 1521)  aspirin chewable tablet 324 mg (324 mg Oral Not Given 11/06/17 1505)    Or  aspirin suppository 300 mg ( Rectal See Alternative 11/06/17 1505)  nitroGLYCERIN (NITROSTAT) SL tablet 0.4 mg (has no administration in time range)  ondansetron (ZOFRAN) injection 4 mg (has no administration in time range)  enoxaparin (LOVENOX) injection 40 mg (has no administration in time range)  insulin pump (has no administration in time range)     Initial Impression / Assessment and Plan / ED Course  I have reviewed the triage vital signs and the nursing notes.  Pertinent labs & imaging results that were available during my care of the patient were reviewed by me and considered in my medical decision making (see chart for details).     66  year old female here with high risk story of dull substernal chest pressure with shortness of breath.  Her heart score is 4-5.  EKG nonischemic and troponin negative.  Will admit for high risk chest pain. Dr. Wynonia Lawman asked Hospitalist to call directly if cardiology is needed. Given absence of pos trop or acute EKG changes, will admit to medicine primarily.  Final Clinical Impressions(s) / ED Diagnoses   Final diagnoses:  Chest pain with high risk for cardiac etiology    ED Discharge Orders    None       Duffy Bruce, MD 11/06/17 1643

## 2017-11-06 NOTE — H&P (Addendum)
History and Physical:    Jamie Mays   VEL:[id number] DOB: 06-25-1951 DOA: 11/06/2017  Referring MD/provider: Dr. Ellender Hose PCP: Jani Gravel, MD   Patient coming from: Home  Chief Complaint: new sensation of shortness of breath and feeling of presyncope over the past 2-3 weeks worsening over the past 2 days, woken up from sleep this morning for this  History of Present Illness:   Jamie Mays is an 67 y.o. female with past medical history significant for hypertension and diabetes on an insulin pump who was in her usual state of good health until to 3 weeks ago when she noted onset of shortness of breath both at rest and with exertion that was accompanied by a sensation of feeling like she was going to pass out and a weakness throughout her body. Patient notes the symptoms would last 10-15 minutes and then would resolve spontaneously. Patient notes that over the past 2 days patient has had similar symptoms while making breakfast which also resolved spontaneously however this morning patient was awoken with a sensation of shortness of breath and feeling weakness in her upper and lower extremities. Patient came to the ED because these symptoms lasted longer than the usual 10-15 minutes. Patient denies any chest pain per se.  Patient notes that she has been physically quite active over the past 6 months since she moved back home as they have been re-doing their house and yard. Patient notes that she is been moving rocks over the past 2 days and move rocks last night without feeling short of breath or feeling like she was going to pass out. Patient does deny chest pain she does deny any palpitations. No actual syncope. She has had previous feelings of presyncope when she bends over and questions whether this may be due to being on too many antihypertensives.  Patient does not smoke.  ED Course:  The patient was treated with aspirin 325 mg and was noted to have no acute ST-T wave changes on EKG.  She is presently a symptom medic and feels well. She is now admitted for workup of possible ACS.  ROS:   ROS   Review of Systems: General: No fever, chills, weight changes Endocrine: no heat/cold intolerance, no polyuria Respiratory: No cough,, shortness of breath, hemoptysis Cardiovascular: No palpitations, chest pain GI: No nausea, vomiting, diarrhea, constipation GU: No dysuria, increased frequency CNS: No numbness, dizziness, headache Musculoskeletal: No back pain, joint pain Blood/lymphatics: No easy bruising, bleeding  Past Medical History:   Past Medical History:  Diagnosis Date  . Arthritis   . Diabetes mellitus   . Family history of MI (myocardial infarction)   . Hypertension   . PVC (premature ventricular contraction)   . Smoker   . Syncope     Past Surgical History:   Past Surgical History:  Procedure Laterality Date  . CARDIAC CATHETERIZATION  2007   neg  . CESAREAN SECTION      Social History:   Social History   Socioeconomic History  . Marital status: Married    Spouse name: Not on file  . Number of children: Not on file  . Years of education: Not on file  . Highest education level: Not on file  Occupational History  . Occupation: inpatient charge entry    Employer: [employer]  . Financial resource strain: Not on file  . Food insecurity:    Worry: Not on file    Inability: Not on file  .  Transportation needs:    Medical: Not on file    Non-medical: Not on file  Tobacco Use  . Smoking status: Former Smoker    Last attempt to quit: 07/12/1981    Years since quitting: 36.3  . Smokeless tobacco: Never Used  Substance and Sexual Activity  . Alcohol use: Yes    Alcohol/week: 0.0 oz    Comment: glass wine every 6 mths.  . Drug use: No  . Sexual activity: Yes    Partners: Male  Lifestyle  . Physical activity:    Days per week: Not on file    Minutes per session: Not on file  . Stress: Not on file  Relationships  . Social  connections:    Talks on phone: Not on file    Gets together: Not on file    Attends religious service: Not on file    Active member of club or organization: Not on file    Attends meetings of clubs or organizations: Not on file    Relationship status: Not on file  . Intimate partner violence:    Fear of current or ex partner: Not on file    Emotionally abused: Not on file    Physically abused: Not on file    Forced sexual activity: Not on file  Other Topics Concern  . Not on file  Social History Narrative   Regular exercise-yes      Diet: fruit and veggies, but doesn't stick to diabetes diet    Allergies   Doxycycline and Lisinopril  Family history:   Family History  Problem Relation Age of Onset  . Coronary artery disease Mother   . Kidney disease Mother   . Diabetes Mother   . Heart disease Mother        heart attack  . Hypertension Mother   . Diabetes Sister   . Diabetes Brother   . Cancer Brother        lung cancer  . Diabetes Maternal Grandmother   . Cancer Brother 4       lungs, brain, chest wall, liver cancer, stage 4  . Cancer Other 70       lung , brain    Current Medications:   Prior to Admission medications   Medication Sig Start Date End Date Taking? Authorizing Provider  acetaminophen (TYLENOL) 325 MG tablet Take 650 mg by mouth every 6 (six) hours as needed for mild pain.   Yes [provider]  aspirin 81 MG tablet Take 81 mg by mouth daily.     Yes [provider]  cycloSPORINE (RESTASIS) 0.05 % ophthalmic emulsion Place 1 drop into both eyes 2 (two) times daily.   Yes [provider]  glucose blood test strip 1 each by Other route 6 (six) times daily. Use as instructed    Yes [provider]  hydrochlorothiazide (HYDRODIURIL) 12.5 MG tablet Take 12.5 mg by mouth daily.   Yes [provider]  ibuprofen (ADVIL,MOTRIN) 200 MG tablet Take 200 mg by mouth every 6 (six) hours as needed for moderate pain.    Yes [provider]  insulin aspart (NOVOLOG) 100 UNIT/ML injection Inject into the skin See admin instructions. Via insulin pump as directed   Yes [provider]  irbesartan (AVAPRO) 150 MG tablet Take 150 mg by mouth daily.   Yes [provider]  levothyroxine (SYNTHROID, LEVOTHROID) 75 MCG tablet Take 1 tablet (75 mcg total) by mouth daily. 03/29/16  Yes Dickie La,  MD  Menthol, Topical Analgesic, (BIOFREEZE EX) Apply 1 application topically daily as needed (shoulder/hip pain).   Yes [provider]  Omega-3 Fatty Acids (FISH OIL ADULT GUMMIES PO) Take 1 each by mouth daily.   Yes [provider]  TURMERIC PO Take 1 tablet by mouth daily.   Yes [provider]  cyclobenzaprine (FLEXERIL) 5 MG tablet Take 1/2 or 1 tablet at bed time prn for leg cramps Patient not taking: Reported on 11/06/2017 02/10/11   Dickie La, MD    Physical Exam:   Vitals:   11/06/17 1215 11/06/17 1230 11/06/17 1245 11/06/17 1300  BP: 132/62 124/63 137/67 (!) 136/58  Pulse: (!) 56 63 (!) 57 (!) 57  Resp: 18 16 10 18   Temp:      TempSrc:      SpO2: 96% 96% 98% 97%  Weight:      Height:         Physical Exam: Blood pressure (!) 136/58, pulse (!) 57, temperature 97.7 F (36.5 C), temperature source Oral, resp. rate 18, height 5\' 5"  (1.651 m), weight 70.3 kg (155 lb), SpO2 97 %. Gen: pleasant well-appearing female sitting up in bed in no distress at all. Eyes: Sclerae anicteric. Conjunctiva mildly injected. Chest: Moderately good air entry bilaterally with no adventitious sounds.  CV: Distant, regular, she has a 2/6 systolic murmur at left sternal border without radiation Abdomen: NABS, soft, nondistended, nontender. No tenderness to light or deep palpation. No rebound, no guarding. Extremities: No edema.  Skin: Warm and dry. No rashes, lesions or wounds. Neuro: Alert and oriented times 3; grossly nonfocal. Psych: Patient is cooperative, logical and  coherent with appropriate mood and affect.  Data Review:    Labs: Basic Metabolic Panel: Recent Labs  Lab 11/06/17 0614  NA 139  K 4.6  CL 105  CO2 25  GLUCOSE 183*  BUN 22*  CREATININE 0.84  CALCIUM 9.7   Liver Function Tests: No results for input(s): AST, ALT, ALKPHOS, BILITOT, PROT, ALBUMIN in the last 168 hours. No results for input(s): LIPASE, AMYLASE in the last 168 hours. No results for input(s): AMMONIA in the last 168 hours. CBC: Recent Labs  Lab 11/06/17 0614  WBC 5.3  HGB 15.4*  HCT 44.2  MCV 87.7  PLT 195   Cardiac Enzymes: No results for input(s): CKTOTAL, CKMB, CKMBINDEX, TROPONINI in the last 168 hours.  BNP (last 3 results) No results for input(s): PROBNP in the last 8760 hours. CBG: Recent Labs  Lab 11/06/17 1023  GLUCAP 144*    Urinalysis    Component Value Date/Time   COLORURINE YELLOW 11/06/2017 0605   APPEARANCEUR HAZY (A) 11/06/2017 0605   LABSPEC 1.028 11/06/2017 0605   PHURINE 5.0 11/06/2017 0605   GLUCOSEU NEGATIVE 11/06/2017 0605   GLUCOSEU > 1000 mg/dL (A) 02/05/2009 2019   HGBUR SMALL (A) 11/06/2017 0605   BILIRUBINUR NEGATIVE 11/06/2017 0605   KETONESUR NEGATIVE 11/06/2017 0605   PROTEINUR NEGATIVE 11/06/2017 0605   UROBILINOGEN 0.2 02/05/2009 2019   NITRITE NEGATIVE 11/06/2017 0605   LEUKOCYTESUR SMALL (A) 11/06/2017 0605      Radiographic Studies: Dg Chest 2 View  Result Date: 11/06/2017 CLINICAL DATA:  Acute onset shortness of breath this morning. Presyncope. EXAM: CHEST - 2 VIEW COMPARISON:  01/12/2006 FINDINGS: The heart size and mediastinal contours are within normal limits. Both lungs are clear. The visualized skeletal structures are unremarkable. IMPRESSION: No active cardiopulmonary disease. Electronically Signed   By: Sharrie Rothman.D.  On: 11/06/2017 07:07    EKG: Independently reviewed. Sinus rhythm at 60. Incomplete) chronic block. Right axis deviation. No acute ST-T wave changes.   Assessment/Plan:     Principal Problem:   Angina decubitus (HCC) Active Problems:   Diabetes type 2, controlled ([place])   Essential hypertension   Hypothyroidism   SOB AND WEAKNESS  Female with diabetes presents with what sounds like shortness of breath and weakness as her anginal equivalent. Sounds like she has had accelerated angina over the past 2-3 weeks with perhaps angina decubitus this morning. EKG is without any acute changes and her right bundle-branch block is been developing so is not new. Initial troponin is negative. Patient is a symptomatically at present. She has received 325 of aspirin this morning. Will admit patient for rule out acute coronary syndrome via EKGs and trending enzymes I have requested cardiology to see patient to help [place] decide whether patient should undergo cardiac catheterization versus stress testing. Other than her recent symptomatology patient is an good shape with what sounds like excellent exercise tolerance.  HTN Patient notes intermittent dizziness when she bends over, notes she has not taken her blood pressure medications in over 24 hours and her blood pressure here is 124/80. I am holding HCTZ, continuing irbesartan given symptomatology. It may be that she would benefit from a beta blocker although given patient being on insulin pump there should be some consideration as to a symptomatically hypoglycemic aware this to be initiated.  DM Is on an insulin pump at home, will continue home doses  HYPOTHRYOIDISM Continue synthroid     Other information:   DVT prophylaxis: Lovenox ordered. Code Status: Full code. Family Communication: patient states her husband knows that she is here Disposition Plan: home Consults called: cardiology, Dr. Wynonia Lawman Admission status: observation    [facility]  If 7PM-7AM, please contact night-coverage [email or web address] Password [facility] 11/06/2017, 1:06 PM

## 2017-11-06 NOTE — ED Notes (Signed)
Sent message to pharmacy for pt medications.

## 2017-11-06 NOTE — Consult Note (Signed)
Cardiology Consult Note  Admit date: 11/06/2017 Name: Jamie Mays 67 y.o.  female DOB:  26-Dec-1950 MRN:  [id number]  Today's date:  11/06/2017  Referring Physician:    [facility]  Primary Physician:   Dr. Jani Gravel  Reason for Consultation:    Weakness IMPRESSIONS: 1.  Isolated episode of weakness not associated with hypoglycemia this morning of uncertain etiology 2.  Vague dyspnea of uncertain etiology 3.  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  Hyperlipidemia 5.  Right bundle branch block undetermined age of onset  RECOMMENDATION: The patient previously a number of years ago did not have significant coronary artery disease.  She does have long-standing diabetes and this puts her at risk.  Her EKG shows a right bundle branch block pattern.  Her symptoms were somewhat vague and if she does well overnight I would cycle her enzymes check an echocardiogram and consider doing a stress nuclear study tomorrow.  Alternatively could do a cardiac CTA but would prefer an exercise stress test to evaluate her because this would evaluate conduction system function as well as evaluate for ischemia.  HISTORY: This very nice 67 year old female was seen at the request of [facility] for evaluation of weakness and dyspnea.  The patient awoke this morning around 4 AM with the onset of severe weakness involving her hands arms and was moderately short of breath.  She did not have any chest discomfort.  She did not have any numbness.  The symptoms persisted and she felt profoundly weak and checked her sugar and did not notice any hypoglycemia.  She has had some intermittent sensation of weakness mild shortness of breath that lasts 5 to 10 minutes and will occur intermittently.  She denies again any anginal type chest pain and has been able to do physical work and other work around her house without any significant difficulty.  She denies PND, orthopnea or edema.  Past Medical  History:  Diagnosis Date  . Arthritis   . Diabetes mellitus   . Family history of MI (myocardial infarction)   . Hypertension   . PVC (premature ventricular contraction)   . Smoker   . Syncope       Past Surgical History:  Procedure Laterality Date  . CARDIAC CATHETERIZATION  2007   neg  . CESAREAN SECTION      Allergies:  is allergic to doxycycline and lisinopril.   Medications: Prior to Admission medications   Medication Sig Start Date End Date Taking? Authorizing Provider  acetaminophen (TYLENOL) 325 MG tablet Take 650 mg by mouth every 6 (six) hours as needed for mild pain.   Yes [provider]  aspirin 81 MG tablet Take 81 mg by mouth daily.     Yes [provider]  cycloSPORINE (RESTASIS) 0.05 % ophthalmic emulsion Place 1 drop into both eyes 2 (two) times daily.   Yes [provider]  glucose blood test strip 1 each by Other route 6 (six) times daily. Use as instructed    Yes [provider]  hydrochlorothiazide (HYDRODIURIL) 12.5 MG tablet Take 12.5 mg by mouth daily.   Yes [provider]  ibuprofen (ADVIL,MOTRIN) 200 MG tablet Take 200 mg by mouth every 6 (six) hours as needed for moderate pain.   Yes [provider]  insulin aspart (NOVOLOG) 100 UNIT/ML injection Inject into the skin See admin instructions. Via insulin pump as directed   Yes [provider]  irbesartan (AVAPRO) 150 MG tablet Take  150 mg by mouth daily.   Yes [provider]  levothyroxine (SYNTHROID, LEVOTHROID) 75 MCG tablet Take 1 tablet (75 mcg total) by mouth daily. 03/29/16  Yes Dickie La, MD  Menthol, Topical Analgesic, (BIOFREEZE EX) Apply 1 application topically daily as needed (shoulder/hip pain).   Yes [provider]  Omega-3 Fatty Acids (FISH OIL ADULT GUMMIES PO) Take 1 each by mouth daily.   Yes [provider]  TURMERIC PO Take 1 tablet by mouth daily.   Yes [provider]   cyclobenzaprine (FLEXERIL) 5 MG tablet Take 1/2 or 1 tablet at bed time prn for leg cramps Patient not taking: Reported on 11/06/2017 02/10/11   Dickie La, MD   Family History: Family Status  Relation Name Status  . Mother  Deceased  . Father  Deceased  . Sister  Alive  . Brother  Alive  . MGM  Deceased  . Brother  Alive  . Other Brother Deceased    Social History:   reports that she quit smoking about 36 years ago. She has never used smokeless tobacco. She reports that she drinks alcohol. She reports that she does not use drugs.   Social History   Social History Narrative   Regular exercise-yes      Diet: fruit and veggies, but doesn't stick to diabetes diet    Review of Systems: She has a moderate amount of arthritis.  She has had a recent eye hemorrhage but has not had any laser treatments for it.  She denies incontinence.  She has had some mild indigestion at times.  Does have occasional arthritis.  Other than as noted above the remainder of the review of systems is unremarkable.  Physical Exam: BP (!) 142/62   Pulse 72   Temp 97.7 F (36.5 C) (Oral)   Resp (!) 23   Ht 5\' 5"  (1.651 m)   Wt 70.3 kg (155 lb)   SpO2 97%   BMI 25.79 kg/m   General appearance: Pleasant white female currently in no acute distress Head: Normocephalic, without obvious abnormality, atraumatic Eyes: conjunctivae/corneas clear. PERRL, EOM's intact. Fundi not examined Neck: no adenopathy, no carotid bruit, no JVD and supple, symmetrical, trachea midline Lungs: clear to auscultation bilaterally Heart: regular rate and rhythm, S1, S2 normal, no murmur, click, rub or gallop Abdomen: soft, non-tender; bowel sounds normal; no masses,  no organomegaly Pelvic: deferred Extremities: Bilateral venous insufficiency changes noted, trace edema in the right leg, full range of motion, no deformity Pulses: 2+ and symmetric Skin: Skin color, texture, turgor normal. No rashes or lesions Lymph nodes:  Cervical, supraclavicular, and axillary nodes normal. Neurologic: Grossly normal Psych: Alert and oriented x 3 Labs: CBC Recent Labs    11/06/17 1248  WBC 5.8  RBC 4.85  HGB 14.8  HCT 42.1  PLT 176  MCV 86.8  MCH 30.5  MCHC 35.2  RDW 12.8   CMP  Recent Labs    11/06/17 0614  NA 139  K 4.6  CL 105  CO2 25  GLUCOSE 183*  BUN 22*  CREATININE 0.84  CALCIUM 9.7  GFRNONAA >60  GFRAA >60   BNP (last 3 results) BNP    Component Value Date/Time   BNP 110.0 (H) 11/06/2017 0925   Cardiac Panel (last 3 results) Troponin (Point of Care Test) Recent Labs    11/06/17 1044  TROPIPOC 0.00   Radiology:  No acute disease  EKG: Normal sinus rhythm, right bundle branch block, right axis  deviation, the right bundle branch block is new since a previous EKG a number of years ago. Independently reviewed by me  Signed:  W. Doristine Church MD [facility]   Cardiology Consultant  11/06/2017, 2:08 PM

## 2017-11-06 NOTE — ED Notes (Signed)
IV team at bedside 

## 2017-11-06 NOTE — ED Triage Notes (Signed)
Patient is from home, states that she was short of breath at home, it woke her from sleep.  She states that the shortness of breath lasted about 30 mins and that she felt like she was going to pass out.  No chest pain, but she had a weird feeling behind her left shoulder.  No nausea or vomiting.

## 2017-11-07 ENCOUNTER — Observation Stay (HOSPITAL_BASED_OUTPATIENT_CLINIC_OR_DEPARTMENT_OTHER): Payer: Medicare Other

## 2017-11-07 DIAGNOSIS — E119 Type 2 diabetes mellitus without complications: Secondary | ICD-10-CM | POA: Diagnosis not present

## 2017-11-07 DIAGNOSIS — R079 Chest pain, unspecified: Secondary | ICD-10-CM

## 2017-11-07 DIAGNOSIS — E039 Hypothyroidism, unspecified: Secondary | ICD-10-CM | POA: Diagnosis not present

## 2017-11-07 DIAGNOSIS — I10 Essential (primary) hypertension: Secondary | ICD-10-CM | POA: Diagnosis not present

## 2017-11-07 DIAGNOSIS — I208 Other forms of angina pectoris: Secondary | ICD-10-CM | POA: Diagnosis not present

## 2017-11-07 DIAGNOSIS — Z794 Long term (current) use of insulin: Secondary | ICD-10-CM | POA: Diagnosis not present

## 2017-11-07 LAB — NM MYOCAR MULTI W/SPECT W/WALL MOTION / EF
CHL CUP MPHR: 153 {beats}/min
CSEPHR: 89 %
CSEPPHR: 137 {beats}/min
Estimated workload: 7 METS
Exercise duration (min): 4 min
Exercise duration (sec): 59 s
Rest HR: 62 {beats}/min

## 2017-11-07 LAB — CBC
HCT: 46.5 % — ABNORMAL HIGH (ref 36.0–46.0)
Hemoglobin: 16.4 g/dL — ABNORMAL HIGH (ref 12.0–15.0)
MCH: 31.4 pg (ref 26.0–34.0)
MCHC: 35.3 g/dL (ref 30.0–36.0)
MCV: 88.9 fL (ref 78.0–100.0)
PLATELETS: 206 10*3/uL (ref 150–400)
RBC: 5.23 MIL/uL — ABNORMAL HIGH (ref 3.87–5.11)
RDW: 13.3 % (ref 11.5–15.5)
WBC: 6.3 10*3/uL (ref 4.0–10.5)

## 2017-11-07 LAB — BASIC METABOLIC PANEL
Anion gap: 7 (ref 5–15)
BUN: 17 mg/dL (ref 6–20)
CHLORIDE: 106 mmol/L (ref 101–111)
CO2: 27 mmol/L (ref 22–32)
CREATININE: 0.77 mg/dL (ref 0.44–1.00)
Calcium: 9.3 mg/dL (ref 8.9–10.3)
GFR calc Af Amer: >60 mL/min (ref >60)
GFR calc non Af Amer: >60 mL/min (ref >60)
Glucose, Bld: 122 mg/dL — ABNORMAL HIGH (ref 65–99)
Potassium: 3.6 mmol/L (ref 3.5–5.1)
Sodium: 140 mmol/L (ref 135–145)

## 2017-11-07 LAB — TROPONIN I: Troponin I: <0.03 ng/mL (ref <0.03)

## 2017-11-07 LAB — GLUCOSE, CAPILLARY
GLUCOSE-CAPILLARY: 152 mg/dL — AB (ref 65–99)
Glucose-Capillary: 250 mg/dL — ABNORMAL HIGH (ref 65–99)
Glucose-Capillary: 107 mg/dL — ABNORMAL HIGH (ref 65–99)
Glucose-Capillary: 215 mg/dL — ABNORMAL HIGH (ref 65–99)
Glucose-Capillary: 133 mg/dL — ABNORMAL HIGH (ref 65–99)

## 2017-11-07 MED ORDER — TECHNETIUM TC 99M TETROFOSMIN IV KIT
30.0000 | PACK | Freq: Once | INTRAVENOUS | Status: AC | PRN
  Administered 2017-11-07: 30 via INTRAVENOUS

## 2017-11-07 MED ORDER — TECHNETIUM TC 99M TETROFOSMIN IV KIT
10.0000 | PACK | Freq: Once | INTRAVENOUS | Status: AC | PRN
  Administered 2017-11-07: 10 via INTRAVENOUS

## 2017-11-07 NOTE — Discharge Summary (Signed)
Physician Discharge Summary  Jamie Mays QPR:[id number] DOB: [birth year] DOA: 11/06/2017  PCP: Jani Gravel, MD  Admit date: 11/06/2017 Discharge date: 11/07/2017   Recommendations for Outpatient Follow-Up:   1. Low risk stress test-- ? symptomatic PVCs- PRN follow up with cards 2. BP meds have been adjusted   Discharge Diagnosis:   Principal Problem:   Angina decubitus ([place]) Active Problems:   Diabetes type 2, controlled ([place])   Essential hypertension   Hypothyroidism   Discharge disposition:  Home  Discharge Condition: Improved.  Diet recommendation: Low sodium, heart healthy.  Carbohydrate-modified  Wound care: None.   History of Present Illness:   Jamie Mays is an 67 y.o. female with past medical history significant for hypertension and diabetes on an insulin pump who was in her usual state of good health until to 3 weeks ago when she noted onset of shortness of breath both at rest and with exertion that was accompanied by a sensation of feeling like she was going to pass out and a weakness throughout her body. Patient notes the symptoms would last 10-15 minutes and then would resolve spontaneously. Patient notes that over the past 2 days patient has had similar symptoms while making breakfast which also resolved spontaneously however this morning patient was awoken with a sensation of shortness of breath and feeling weakness in her upper and lower extremities. Patient came to the ED because these symptoms lasted longer than the usual 10-15 minutes. Patient denies any chest pain per se.  Patient notes that she has been physically quite active over the past 6 months since she moved back home as they have been re-doing their house and yard. Patient notes that she is been moving rocks over the past 2 days and move rocks last night without feeling short of breath or feeling like she was going to pass out. Patient does deny chest pain she does deny any palpitations. No  actual syncope. She has had previous feelings of presyncope when she bends over and questions whether this may be due to being on too many antihypertensives     Hospital Course by Problem:   SOB AND WEAKNESS  -resolved -low risk stress test ? Hypotension  HTN D/c HCTZ -continue ARB   DM Continue insulin pump  HYPOTHRYOIDISM Continue synthroid       Medical Consultants:    None.   Discharge Exam:   Vitals:   11/07/17 1045 11/07/17 1209  BP: (!) 148/45 (!) 114/55  Pulse:  65  Resp:  18  Temp:  (!) 97.5 F (36.4 C)  SpO2:  98%   Vitals:   11/07/17 1037 11/07/17 1041 11/07/17 1045 11/07/17 1209  BP: (!) 182/43 (!) 201/48 (!) 148/45 (!) 114/55  Pulse:    65  Resp:    18  Temp:    (!) 97.5 F (36.4 C)  TempSrc:    Oral  SpO2:    98%  Weight:      Height:        Gen:  NAD-- feeling much better, was already dressed to leave the hospital  The results of significant diagnostics from this hospitalization (including imaging, microbiology, ancillary and laboratory) are listed below for reference.     Procedures and Diagnostic Studies:   Dg Chest 2 View  Result Date: 11/06/2017 CLINICAL DATA:  Acute onset shortness of breath this morning. Presyncope. EXAM: CHEST - 2 VIEW COMPARISON:  01/12/2006 FINDINGS: The heart size and mediastinal contours are within normal limits. Both  lungs are clear. The visualized skeletal structures are unremarkable. IMPRESSION: No active cardiopulmonary disease. Electronically Signed   By: Earle Gell M.D.   On: 11/06/2017 07:07   Nm Myocar Multi W/spect W/wall Motion / Ef  Result Date: 11/07/2017  Blood pressure demonstrated a normal response to exercise.  There was no ST segment deviation noted during stress.  No T wave inversion was noted during stress.  Defect 1: There is a small defect of mild severity present in the apical anterior location.  This is a low risk study.  The left ventricular ejection fraction is normal  (55-65%).  Nuclear stress EF: 61%.  Low risk stress nuclear study a small area of anteroapical ischemia (versus shifting breast attenuation artifact) and normal left ventricular regional and global systolic function.     Labs:   Basic Metabolic Panel: Recent Labs  Lab 11/06/17 0614 11/06/17 1248 11/07/17 0713  NA 139  --  140  K 4.6  --  3.6  CL 105  --  106  CO2 25  --  27  GLUCOSE 183*  --  122*  BUN 22*  --  17  CREATININE 0.84 0.66 0.77  CALCIUM 9.7  --  9.3   GFR Estimated Creatinine Clearance: 67.2 mL/min (by C-G formula based on SCr of 0.77 mg/dL). Liver Function Tests: No results for input(s): AST, ALT, ALKPHOS, BILITOT, PROT, ALBUMIN in the last 168 hours. No results for input(s): LIPASE, AMYLASE in the last 168 hours. No results for input(s): AMMONIA in the last 168 hours. Coagulation profile No results for input(s): INR, PROTIME in the last 168 hours.  CBC: Recent Labs  Lab 11/06/17 0614 11/06/17 1248 11/07/17 0713  WBC 5.3 5.8 6.3  HGB 15.4* 14.8 16.4*  HCT 44.2 42.1 46.5*  MCV 87.7 86.8 88.9  PLT 195 176 206   Cardiac Enzymes: Recent Labs  Lab 11/06/17 1248 11/06/17 1831 11/07/17 0021  TROPONINI <0.03 <0.03 <0.03   BNP: Invalid input(s): POCBNP CBG: Recent Labs  Lab 11/06/17 2109 11/07/17 0159 11/07/17 0747 11/07/17 1136 11/07/17 1618  GLUCAP 250* 107* 152* 215* 133*   D-Dimer No results for input(s): DDIMER in the last 72 hours. Hgb A1c No results for input(s): HGBA1C in the last 72 hours. Lipid Profile No results for input(s): CHOL, HDL, LDLCALC, TRIG, CHOLHDL, LDLDIRECT in the last 72 hours. Thyroid function studies No results for input(s): TSH, T4TOTAL, T3FREE, THYROIDAB in the last 72 hours.  Invalid input(s): FREET3 Anemia work up No results for input(s): VITAMINB12, FOLATE, FERRITIN, TIBC, IRON, RETICCTPCT in the last 72 hours. Microbiology No results found for this or any previous visit (from the past 240  hour(s)).   Discharge Instructions:   Discharge Instructions    Diet - low sodium heart healthy   Complete by:  As directed    Diet Carb Modified   Complete by:  As directed    Discharge instructions   Complete by:  As directed    You have had a low risk stress test On the heart monitor you have had some premature beats-- limit caffeine Routine labs including TSH at next PCP visit   Increase activity slowly   Complete by:  As directed      Allergies as of 11/07/2017      Reactions   Doxycycline Other (See Comments)   Joint pain   Lisinopril Cough      Medication List    STOP taking these medications   cyclobenzaprine 5 MG tablet Commonly  known as:  FLEXERIL   hydrochlorothiazide 12.5 MG tablet Commonly known as:  HYDRODIURIL     TAKE these medications   acetaminophen 325 MG tablet Commonly known as:  TYLENOL Take 650 mg by mouth every 6 (six) hours as needed for mild pain.   aspirin 81 MG tablet Take 81 mg by mouth daily.   BIOFREEZE EX Apply 1 application topically daily as needed (shoulder/hip pain).   cycloSPORINE 0.05 % ophthalmic emulsion Commonly known as:  RESTASIS Place 1 drop into both eyes 2 (two) times daily.   FISH OIL ADULT GUMMIES PO Take 1 each by mouth daily.   glucose blood test strip 1 each by Other route 6 (six) times daily. Use as instructed   ibuprofen 200 MG tablet Commonly known as:  ADVIL,MOTRIN Take 200 mg by mouth every 6 (six) hours as needed for moderate pain.   insulin aspart 100 UNIT/ML injection Commonly known as:  novoLOG Inject into the skin See admin instructions. Via insulin pump as directed   irbesartan 150 MG tablet Commonly known as:  AVAPRO Take 150 mg by mouth daily.   levothyroxine 75 MCG tablet Commonly known as:  SYNTHROID, LEVOTHROID Take 1 tablet (75 mcg total) by mouth daily.   TURMERIC PO Take 1 tablet by mouth daily.      Follow-up Information    Jani Gravel, MD Follow up.   Specialty:   Internal Medicine Why:  call for appointment Contact information: [address] [place] [address] [phone]            Time coordinating discharge: 35 min  Signed:  Geradine Girt   [facility] 11/07/2017, 4:54 PM

## 2017-11-07 NOTE — Progress Notes (Signed)
Dc instructions reviewed with pt, pt verbalized that needs to call PCP re: f/u appt, all questions entertained and answered. Pt stable at dc

## 2017-11-07 NOTE — Progress Notes (Signed)
Plan is for d/c once stress test back if low risk.  Patient feeling much better.  Await reading.  Eulogio Bear DO

## 2017-11-07 NOTE — Progress Notes (Signed)
Spoke with patient about her diabetes.  States that she has a [employer]tr[employer] insulin pump that she has had for about 5 years. Was diagnosed with diabetes 38 years ago.  States that her last A1C was 6.7%. Seems to be in very good control of her diabetes.  Is seen by endocrinologist at [facility].   Insulin pump settings: Basal:   12 am - 2:30 am  1.6 units/hr.                 2:30 am - 8:30 am   1.9 units/hr.                 8:30 am -5:30 pm    1.75 units/hr.    Total: 15 units CHO coverage: 1 unit/5 gm CHO Target blood sugar: 110-130 mg/dl Insulin sensitivity  1:20  Changed site on 11/06/17. Planning to be discharged today. Will continue to monitor blood sugars while in the hospital.  Harvel Ricks RN BSN CDE Diabetes Coordinator Pager: [phone]  8am-5pm

## 2017-11-07 NOTE — Progress Notes (Signed)
     Jamie Mays presented for a nuclear stress test today.  No immediate complications.  Stress imaging is pending at this time.  Preliminary EKG findings may be listed in the chart, but the stress test result will not be finalized until perfusion imaging is complete.   Kathyrn Drown, PA-C 11/07/2017, 10:43 AM

## 2017-12-21 DIAGNOSIS — E559 Vitamin D deficiency, unspecified: Secondary | ICD-10-CM | POA: Diagnosis not present

## 2017-12-21 DIAGNOSIS — E039 Hypothyroidism, unspecified: Secondary | ICD-10-CM | POA: Diagnosis not present

## 2017-12-21 DIAGNOSIS — E109 Type 1 diabetes mellitus without complications: Secondary | ICD-10-CM | POA: Diagnosis not present

## 2017-12-21 DIAGNOSIS — I10 Essential (primary) hypertension: Secondary | ICD-10-CM | POA: Diagnosis not present

## 2018-01-23 DIAGNOSIS — E109 Type 1 diabetes mellitus without complications: Secondary | ICD-10-CM | POA: Diagnosis not present

## 2018-01-25 DIAGNOSIS — E042 Nontoxic multinodular goiter: Secondary | ICD-10-CM | POA: Diagnosis not present

## 2018-01-25 DIAGNOSIS — E109 Type 1 diabetes mellitus without complications: Secondary | ICD-10-CM | POA: Diagnosis not present

## 2018-01-25 DIAGNOSIS — E039 Hypothyroidism, unspecified: Secondary | ICD-10-CM | POA: Diagnosis not present

## 2018-03-21 DIAGNOSIS — E039 Hypothyroidism, unspecified: Secondary | ICD-10-CM | POA: Diagnosis not present

## 2018-03-28 DIAGNOSIS — E109 Type 1 diabetes mellitus without complications: Secondary | ICD-10-CM | POA: Diagnosis not present

## 2018-03-28 DIAGNOSIS — E042 Nontoxic multinodular goiter: Secondary | ICD-10-CM | POA: Diagnosis not present

## 2018-03-28 DIAGNOSIS — E039 Hypothyroidism, unspecified: Secondary | ICD-10-CM | POA: Diagnosis not present

## 2018-06-20 DIAGNOSIS — E039 Hypothyroidism, unspecified: Secondary | ICD-10-CM | POA: Diagnosis not present

## 2018-06-20 DIAGNOSIS — Z79899 Other long term (current) drug therapy: Secondary | ICD-10-CM | POA: Diagnosis not present

## 2018-06-20 DIAGNOSIS — E559 Vitamin D deficiency, unspecified: Secondary | ICD-10-CM | POA: Diagnosis not present

## 2018-06-20 DIAGNOSIS — K760 Fatty (change of) liver, not elsewhere classified: Secondary | ICD-10-CM | POA: Diagnosis not present

## 2018-06-20 DIAGNOSIS — E109 Type 1 diabetes mellitus without complications: Secondary | ICD-10-CM | POA: Diagnosis not present

## 2018-06-27 DIAGNOSIS — E559 Vitamin D deficiency, unspecified: Secondary | ICD-10-CM | POA: Diagnosis not present

## 2018-06-27 DIAGNOSIS — E109 Type 1 diabetes mellitus without complications: Secondary | ICD-10-CM | POA: Diagnosis not present

## 2018-06-27 DIAGNOSIS — E039 Hypothyroidism, unspecified: Secondary | ICD-10-CM | POA: Diagnosis not present

## 2018-06-27 DIAGNOSIS — E042 Nontoxic multinodular goiter: Secondary | ICD-10-CM | POA: Diagnosis not present

## 2018-08-04 DIAGNOSIS — H40013 Open angle with borderline findings, low risk, bilateral: Secondary | ICD-10-CM | POA: Diagnosis not present

## 2018-08-04 DIAGNOSIS — E113293 Type 2 diabetes mellitus with mild nonproliferative diabetic retinopathy without macular edema, bilateral: Secondary | ICD-10-CM | POA: Diagnosis not present

## 2018-08-04 DIAGNOSIS — H43811 Vitreous degeneration, right eye: Secondary | ICD-10-CM | POA: Diagnosis not present

## 2018-08-04 DIAGNOSIS — Z961 Presence of intraocular lens: Secondary | ICD-10-CM | POA: Diagnosis not present

## 2018-09-18 DIAGNOSIS — L718 Other rosacea: Secondary | ICD-10-CM | POA: Diagnosis not present

## 2018-11-09 DIAGNOSIS — E042 Nontoxic multinodular goiter: Secondary | ICD-10-CM | POA: Diagnosis not present

## 2018-11-09 DIAGNOSIS — Z6825 Body mass index (BMI) 25.0-25.9, adult: Secondary | ICD-10-CM | POA: Diagnosis not present

## 2018-11-09 DIAGNOSIS — E109 Type 1 diabetes mellitus without complications: Secondary | ICD-10-CM | POA: Diagnosis not present

## 2018-11-09 DIAGNOSIS — E559 Vitamin D deficiency, unspecified: Secondary | ICD-10-CM | POA: Diagnosis not present

## 2018-11-09 DIAGNOSIS — E039 Hypothyroidism, unspecified: Secondary | ICD-10-CM | POA: Diagnosis not present

## 2018-11-09 DIAGNOSIS — Z7189 Other specified counseling: Secondary | ICD-10-CM | POA: Diagnosis not present

## 2019-01-01 DIAGNOSIS — I10 Essential (primary) hypertension: Secondary | ICD-10-CM | POA: Diagnosis not present

## 2019-01-01 DIAGNOSIS — E109 Type 1 diabetes mellitus without complications: Secondary | ICD-10-CM | POA: Diagnosis not present

## 2019-01-01 DIAGNOSIS — E559 Vitamin D deficiency, unspecified: Secondary | ICD-10-CM | POA: Diagnosis not present

## 2019-01-03 DIAGNOSIS — I10 Essential (primary) hypertension: Secondary | ICD-10-CM | POA: Diagnosis not present

## 2019-01-03 DIAGNOSIS — E039 Hypothyroidism, unspecified: Secondary | ICD-10-CM | POA: Diagnosis not present

## 2019-01-03 DIAGNOSIS — Z7189 Other specified counseling: Secondary | ICD-10-CM | POA: Diagnosis not present

## 2019-01-03 DIAGNOSIS — Z1211 Encounter for screening for malignant neoplasm of colon: Secondary | ICD-10-CM | POA: Diagnosis not present

## 2019-01-03 DIAGNOSIS — E109 Type 1 diabetes mellitus without complications: Secondary | ICD-10-CM | POA: Diagnosis not present

## 2019-01-03 DIAGNOSIS — Z0000 Encounter for general adult medical examination without abnormal findings: Secondary | ICD-10-CM | POA: Diagnosis not present

## 2019-01-03 DIAGNOSIS — E559 Vitamin D deficiency, unspecified: Secondary | ICD-10-CM | POA: Diagnosis not present

## 2019-01-03 DIAGNOSIS — M5136 Other intervertebral disc degeneration, lumbar region: Secondary | ICD-10-CM | POA: Diagnosis not present

## 2019-01-10 DIAGNOSIS — M545 Low back pain: Secondary | ICD-10-CM | POA: Diagnosis not present

## 2019-01-10 DIAGNOSIS — M25652 Stiffness of left hip, not elsewhere classified: Secondary | ICD-10-CM | POA: Diagnosis not present

## 2019-01-10 DIAGNOSIS — M6281 Muscle weakness (generalized): Secondary | ICD-10-CM | POA: Diagnosis not present

## 2019-01-10 DIAGNOSIS — M25552 Pain in left hip: Secondary | ICD-10-CM | POA: Diagnosis not present

## 2019-01-14 DIAGNOSIS — Z1212 Encounter for screening for malignant neoplasm of rectum: Secondary | ICD-10-CM | POA: Diagnosis not present

## 2019-01-14 DIAGNOSIS — Z1211 Encounter for screening for malignant neoplasm of colon: Secondary | ICD-10-CM | POA: Diagnosis not present

## 2019-01-16 DIAGNOSIS — M545 Low back pain: Secondary | ICD-10-CM | POA: Diagnosis not present

## 2019-01-16 DIAGNOSIS — M25552 Pain in left hip: Secondary | ICD-10-CM | POA: Diagnosis not present

## 2019-01-16 DIAGNOSIS — M6281 Muscle weakness (generalized): Secondary | ICD-10-CM | POA: Diagnosis not present

## 2019-01-16 DIAGNOSIS — M25652 Stiffness of left hip, not elsewhere classified: Secondary | ICD-10-CM | POA: Diagnosis not present

## 2019-01-18 DIAGNOSIS — M545 Low back pain: Secondary | ICD-10-CM | POA: Diagnosis not present

## 2019-01-18 DIAGNOSIS — M25652 Stiffness of left hip, not elsewhere classified: Secondary | ICD-10-CM | POA: Diagnosis not present

## 2019-01-18 DIAGNOSIS — M6281 Muscle weakness (generalized): Secondary | ICD-10-CM | POA: Diagnosis not present

## 2019-01-18 DIAGNOSIS — M25552 Pain in left hip: Secondary | ICD-10-CM | POA: Diagnosis not present

## 2019-01-23 DIAGNOSIS — M545 Low back pain: Secondary | ICD-10-CM | POA: Diagnosis not present

## 2019-01-23 DIAGNOSIS — M25652 Stiffness of left hip, not elsewhere classified: Secondary | ICD-10-CM | POA: Diagnosis not present

## 2019-01-23 DIAGNOSIS — M6281 Muscle weakness (generalized): Secondary | ICD-10-CM | POA: Diagnosis not present

## 2019-01-25 DIAGNOSIS — M6281 Muscle weakness (generalized): Secondary | ICD-10-CM | POA: Diagnosis not present

## 2019-01-25 DIAGNOSIS — M545 Low back pain: Secondary | ICD-10-CM | POA: Diagnosis not present

## 2019-01-25 DIAGNOSIS — M25652 Stiffness of left hip, not elsewhere classified: Secondary | ICD-10-CM | POA: Diagnosis not present

## 2019-01-26 DIAGNOSIS — R079 Chest pain, unspecified: Secondary | ICD-10-CM | POA: Diagnosis not present

## 2019-01-26 DIAGNOSIS — E039 Hypothyroidism, unspecified: Secondary | ICD-10-CM | POA: Diagnosis not present

## 2019-01-26 DIAGNOSIS — M129 Arthropathy, unspecified: Secondary | ICD-10-CM | POA: Diagnosis not present

## 2019-01-26 DIAGNOSIS — E119 Type 2 diabetes mellitus without complications: Secondary | ICD-10-CM | POA: Diagnosis not present

## 2019-01-26 DIAGNOSIS — I10 Essential (primary) hypertension: Secondary | ICD-10-CM | POA: Diagnosis not present

## 2019-01-26 DIAGNOSIS — R0600 Dyspnea, unspecified: Secondary | ICD-10-CM | POA: Diagnosis not present

## 2019-01-30 DIAGNOSIS — M6281 Muscle weakness (generalized): Secondary | ICD-10-CM | POA: Diagnosis not present

## 2019-01-30 DIAGNOSIS — M25652 Stiffness of left hip, not elsewhere classified: Secondary | ICD-10-CM | POA: Diagnosis not present

## 2019-01-30 DIAGNOSIS — M545 Low back pain: Secondary | ICD-10-CM | POA: Diagnosis not present

## 2019-02-01 DIAGNOSIS — M6281 Muscle weakness (generalized): Secondary | ICD-10-CM | POA: Diagnosis not present

## 2019-02-01 DIAGNOSIS — M545 Low back pain: Secondary | ICD-10-CM | POA: Diagnosis not present

## 2019-02-01 DIAGNOSIS — M25652 Stiffness of left hip, not elsewhere classified: Secondary | ICD-10-CM | POA: Diagnosis not present

## 2019-02-06 DIAGNOSIS — M545 Low back pain: Secondary | ICD-10-CM | POA: Diagnosis not present

## 2019-02-06 DIAGNOSIS — M6281 Muscle weakness (generalized): Secondary | ICD-10-CM | POA: Diagnosis not present

## 2019-02-06 DIAGNOSIS — M25652 Stiffness of left hip, not elsewhere classified: Secondary | ICD-10-CM | POA: Diagnosis not present

## 2019-02-08 DIAGNOSIS — M25652 Stiffness of left hip, not elsewhere classified: Secondary | ICD-10-CM | POA: Diagnosis not present

## 2019-02-08 DIAGNOSIS — M6281 Muscle weakness (generalized): Secondary | ICD-10-CM | POA: Diagnosis not present

## 2019-02-08 DIAGNOSIS — M545 Low back pain: Secondary | ICD-10-CM | POA: Diagnosis not present

## 2019-02-13 DIAGNOSIS — M6281 Muscle weakness (generalized): Secondary | ICD-10-CM | POA: Diagnosis not present

## 2019-02-13 DIAGNOSIS — M545 Low back pain: Secondary | ICD-10-CM | POA: Diagnosis not present

## 2019-02-13 DIAGNOSIS — M25652 Stiffness of left hip, not elsewhere classified: Secondary | ICD-10-CM | POA: Diagnosis not present

## 2019-02-15 DIAGNOSIS — M545 Low back pain: Secondary | ICD-10-CM | POA: Diagnosis not present

## 2019-02-15 DIAGNOSIS — M6281 Muscle weakness (generalized): Secondary | ICD-10-CM | POA: Diagnosis not present

## 2019-02-15 DIAGNOSIS — M25652 Stiffness of left hip, not elsewhere classified: Secondary | ICD-10-CM | POA: Diagnosis not present

## 2019-02-20 DIAGNOSIS — M25652 Stiffness of left hip, not elsewhere classified: Secondary | ICD-10-CM | POA: Diagnosis not present

## 2019-02-20 DIAGNOSIS — M6281 Muscle weakness (generalized): Secondary | ICD-10-CM | POA: Diagnosis not present

## 2019-02-20 DIAGNOSIS — M545 Low back pain: Secondary | ICD-10-CM | POA: Diagnosis not present

## 2019-04-26 ENCOUNTER — Encounter: Payer: Self-pay | Admitting: Cardiovascular Disease

## 2019-04-26 DIAGNOSIS — Z8679 Personal history of other diseases of the circulatory system: Secondary | ICD-10-CM | POA: Diagnosis not present

## 2019-04-26 DIAGNOSIS — R002 Palpitations: Secondary | ICD-10-CM | POA: Diagnosis not present

## 2019-04-26 DIAGNOSIS — E109 Type 1 diabetes mellitus without complications: Secondary | ICD-10-CM | POA: Diagnosis not present

## 2019-04-26 DIAGNOSIS — K760 Fatty (change of) liver, not elsewhere classified: Secondary | ICD-10-CM | POA: Diagnosis not present

## 2019-04-26 DIAGNOSIS — Z23 Encounter for immunization: Secondary | ICD-10-CM | POA: Diagnosis not present

## 2019-04-26 DIAGNOSIS — Z79899 Other long term (current) drug therapy: Secondary | ICD-10-CM | POA: Diagnosis not present

## 2019-04-26 DIAGNOSIS — R252 Cramp and spasm: Secondary | ICD-10-CM | POA: Diagnosis not present

## 2019-04-26 DIAGNOSIS — E039 Hypothyroidism, unspecified: Secondary | ICD-10-CM | POA: Diagnosis not present

## 2019-05-03 DIAGNOSIS — R7989 Other specified abnormal findings of blood chemistry: Secondary | ICD-10-CM | POA: Diagnosis not present

## 2019-05-03 DIAGNOSIS — R252 Cramp and spasm: Secondary | ICD-10-CM | POA: Diagnosis not present

## 2019-05-16 DIAGNOSIS — R002 Palpitations: Secondary | ICD-10-CM | POA: Diagnosis not present

## 2019-05-17 DIAGNOSIS — R002 Palpitations: Secondary | ICD-10-CM | POA: Diagnosis not present

## 2019-06-01 ENCOUNTER — Other Ambulatory Visit: Payer: Self-pay

## 2019-06-01 DIAGNOSIS — Z20822 Contact with and (suspected) exposure to covid-19: Secondary | ICD-10-CM

## 2019-06-01 DIAGNOSIS — Z20828 Contact with and (suspected) exposure to other viral communicable diseases: Secondary | ICD-10-CM | POA: Diagnosis not present

## 2019-06-04 LAB — NOVEL CORONAVIRUS, NAA: SARS-CoV-2, NAA: NOT DETECTED

## 2019-06-06 DIAGNOSIS — I10 Essential (primary) hypertension: Secondary | ICD-10-CM | POA: Diagnosis not present

## 2019-06-06 DIAGNOSIS — E10319 Type 1 diabetes mellitus with unspecified diabetic retinopathy without macular edema: Secondary | ICD-10-CM | POA: Diagnosis not present

## 2019-06-06 DIAGNOSIS — Z1331 Encounter for screening for depression: Secondary | ICD-10-CM | POA: Diagnosis not present

## 2019-06-06 DIAGNOSIS — E042 Nontoxic multinodular goiter: Secondary | ICD-10-CM | POA: Diagnosis not present

## 2019-06-06 DIAGNOSIS — K760 Fatty (change of) liver, not elsewhere classified: Secondary | ICD-10-CM | POA: Diagnosis not present

## 2019-06-06 DIAGNOSIS — M1990 Unspecified osteoarthritis, unspecified site: Secondary | ICD-10-CM | POA: Diagnosis not present

## 2019-06-06 DIAGNOSIS — E083393 Diabetes mellitus due to underlying condition with moderate nonproliferative diabetic retinopathy without macular edema, bilateral: Secondary | ICD-10-CM | POA: Diagnosis not present

## 2019-06-06 DIAGNOSIS — E785 Hyperlipidemia, unspecified: Secondary | ICD-10-CM | POA: Diagnosis not present

## 2019-06-06 DIAGNOSIS — Z1339 Encounter for screening examination for other mental health and behavioral disorders: Secondary | ICD-10-CM | POA: Diagnosis not present

## 2019-06-14 DIAGNOSIS — E7849 Other hyperlipidemia: Secondary | ICD-10-CM | POA: Diagnosis not present

## 2019-06-14 DIAGNOSIS — K760 Fatty (change of) liver, not elsewhere classified: Secondary | ICD-10-CM | POA: Diagnosis not present

## 2019-06-14 DIAGNOSIS — E10319 Type 1 diabetes mellitus with unspecified diabetic retinopathy without macular edema: Secondary | ICD-10-CM | POA: Diagnosis not present

## 2019-06-14 DIAGNOSIS — E559 Vitamin D deficiency, unspecified: Secondary | ICD-10-CM | POA: Diagnosis not present

## 2019-06-14 DIAGNOSIS — E042 Nontoxic multinodular goiter: Secondary | ICD-10-CM | POA: Diagnosis not present

## 2019-06-18 ENCOUNTER — Encounter: Payer: Self-pay | Admitting: Cardiovascular Disease

## 2019-06-18 ENCOUNTER — Other Ambulatory Visit: Payer: Self-pay

## 2019-06-18 ENCOUNTER — Ambulatory Visit (INDEPENDENT_AMBULATORY_CARE_PROVIDER_SITE_OTHER): Payer: Medicare Other | Admitting: Cardiovascular Disease

## 2019-06-18 VITALS — BP 140/64 | HR 69 | Ht 66.0 in | Wt 153.3 lb

## 2019-06-18 DIAGNOSIS — I10 Essential (primary) hypertension: Secondary | ICD-10-CM

## 2019-06-18 DIAGNOSIS — E10649 Type 1 diabetes mellitus with hypoglycemia without coma: Secondary | ICD-10-CM

## 2019-06-18 DIAGNOSIS — I493 Ventricular premature depolarization: Secondary | ICD-10-CM

## 2019-06-18 MED ORDER — GLUCOSE 4 G PO CHEW
1.0000 | CHEWABLE_TABLET | Freq: Once | ORAL | Status: AC
  Administered 2019-06-18: 4 g via ORAL

## 2019-06-18 MED ORDER — PROPRANOLOL HCL 20 MG PO TABS: 20.0000 mg | ORAL_TABLET | Freq: Three times a day (TID) | ORAL | 3 refills | Status: DC | PRN

## 2019-06-18 NOTE — Progress Notes (Signed)
Blood sugar rechecked and was up to 125. Patient reports feeling better with no further needs.

## 2019-06-18 NOTE — Patient Instructions (Addendum)
Medication Instructions:  Please start propranolol 10 to 20 mg as needed for PVCs  If you need a refill on your cardiac medications before your next appointment, please call your pharmacy.    Lab work: No new labs needed   If you have labs (blood work) drawn today and your tests are completely normal, you will receive your results only by: Marland Kitchen MyChart Message (if you have MyChart) OR . A paper copy in the mail If you have any lab test that is abnormal or we need to change your treatment, we will call you to review the results.   Testing/Procedures: No new testing needed   Follow-Up: At [facility], you and your health needs are our priority.  As part of our continuing mission to provide you with exceptional heart care, we have created designated Provider Care Teams.  These Care Teams include your primary Cardiologist (physician) and Advanced Practice Providers (APPs -  Physician Assistants and Nurse Practitioners) who all work together to provide you with the care you need, when you need it.  . You will need a follow up appointment as needed   . Providers on your designated Care Team:   . Murray Hodgkins, NP . Christell Faith, PA-C . Marrianne Mood, PA-C  Any Other Special Instructions Will Be Listed Below (If Applicable).  For educational health videos Log in to : [email or web address] Or : [email or web address], password : [email or web address]

## 2019-06-18 NOTE — Progress Notes (Signed)
Cardiology Office Note  Date:  06/18/2019   ID:  Jamie, Mays June 05, 1951, MRN [id number]  PCP:  Reynold Bowen, MD   Chief Complaint  Patient presents with  . other    Ref by Dr. Reynold Bowen for palitations and asymptmatic SVT. Meds reviewed by the pt. verbally. Pt. c/o right leg swelling and cramping in the left.     HPI:  [name] is a 68 year old woman with past medical history of Diabetes on insulin pump Hypertension Hypothyroidism PVCs Previously admission to the hospital April 2019 chest pain Who presents for follow-up of his palpitations, symptomatic PVCs  April 2019 had stress test, low risk Had sensation like he was going to pass out, weakness Thinks that she had a panic attack Lasted 10 to 15 minutes then resolved No further episodes since that time Was very [place]  No breakfast today Sugars 59 in the office , Had glucose tab, granola bar Suspended insulin before coming in but still ran low  Very active at baseline with no complaints of chest pain or shortness of breath Magnesium for night cramping PVC symptoms seem to come in clusters on for several weeks then gone for several weeks Does not really want a medication for it Previously seen by Dr. Aldona Bar for PVCs many years ago At that time had echocardiogram which she was told was benign  EKG personally reviewed by myself on todays visit NSr rate RBBB rate 69  HBA1C 6.9 to 7.5 Records requested, Total cholesterol 112 LDL 46 potassium 4.8 normal LFTs TSH 0.44 vitamin D 31   PMH:   has a past medical history of Arthritis, Diabetes mellitus, Family history of MI (myocardial infarction), Hypertension, PVC (premature ventricular contraction), Smoker, and Syncope.  PSH:    Past Surgical History:  Procedure Laterality Date  . CARDIAC CATHETERIZATION  2007   neg  . CESAREAN SECTION      Current Outpatient Medications  Medication Sig  Dispense Refill  . acetaminophen (TYLENOL) 325 MG tablet Take 650 mg by mouth every 6 (six) hours as needed for mild pain.    Marland Kitchen aspirin 81 MG tablet Take 81 mg by mouth daily.      . cycloSPORINE (RESTASIS) 0.05 % ophthalmic emulsion Place 1 drop into both eyes 2 (two) times daily.    Marland Kitchen glucose blood test strip 1 each by Other route 6 (six) times daily. Use as instructed     . hydrochlorothiazide (MICROZIDE) 12.5 MG capsule Take 12.5 mg by mouth daily.    Marland Kitchen ibuprofen (ADVIL,MOTRIN) 200 MG tablet Take 200 mg by mouth every 6 (six) hours as needed for moderate pain.    Marland Kitchen insulin aspart (NOVOLOG) 100 UNIT/ML injection Inject into the skin See admin instructions. Via insulin pump as directed    . irbesartan (AVAPRO) 150 MG tablet Take 150 mg by mouth daily.    Marland Kitchen levothyroxine (SYNTHROID, LEVOTHROID) 75 MCG tablet Take 1 tablet (75 mcg total) by mouth daily. 30 tablet 0  . Menthol, Topical Analgesic, (BIOFREEZE EX) Apply 1 application topically daily as needed (shoulder/hip pain).    . Omega-3 Fatty Acids (FISH OIL ADULT GUMMIES PO) Take 1 each by mouth daily.    . TURMERIC PO Take 1 tablet by mouth daily.    . Vitamin D, Ergocalciferol, (DRISDOL) 1.25 MG (50000 UT) CAPS capsule Take 50,000 Units by mouth once a week.     No current facility-administered medications  for this visit.      Allergies:   Doxycycline and Lisinopril   Social History:  The patient  reports that she quit smoking about 37 years ago. She has never used smokeless tobacco. She reports current alcohol use. She reports that she does not use drugs.   Family History:   family history includes Cancer in her brother; Cancer (age of onset: 46) in her brother; Cancer (age of onset: 87) in an other family member; Coronary artery disease in her mother; Diabetes in her brother, maternal grandmother, mother, and sister; Heart disease in her mother; Hypertension in her mother; Kidney disease in her mother.    Review of Systems: Review  of Systems  Constitutional: Negative.   HENT: Negative.   Respiratory: Negative.   Cardiovascular: Negative.   Gastrointestinal: Negative.   Musculoskeletal: Negative.   Neurological: Negative.   Psychiatric/Behavioral: Negative.   All other systems reviewed and are negative.    PHYSICAL EXAM: VS:  BP 140/64 (BP Location: Right Arm, Patient Position: Sitting, Cuff Size: Normal)   Pulse 69   Ht 5\' 6"  (1.676 m)   Wt 153 lb 5 oz (69.5 kg)   SpO2 98%   BMI 24.75 kg/m  , BMI Body mass index is 24.75 kg/m. GEN: Well nourished, well developed, in no acute distress HEENT: normal Neck: no JVD, carotid bruits, or masses Cardiac: RRR; no murmurs, rubs, or gallops,no edema  Respiratory:  clear to auscultation bilaterally, normal work of breathing GI: soft, nontender, nondistended, + BS MS: no deformity or atrophy Skin: warm and dry, no rash Neuro:  Strength and sensation are intact Psych: euthymic mood, full affect   Recent Labs: No results found for requested labs within last 8760 hours.    Lipid Panel Lab Results  Component Value Date   CHOL 101 10/29/2008   HDL 34.10 (L) 10/29/2008   LDLCALC 53 10/29/2008   TRIG 69.0 10/29/2008      Wt Readings from Last 3 Encounters:  06/18/19 153 lb 5 oz (69.5 kg)  11/07/17 155 lb 9.6 oz (70.6 kg)  03/26/15 163 lb 6.4 oz (74.1 kg)      ASSESSMENT AND PLAN:  Problem List Items Addressed This Visit    None     PVCs Benign finding, dating back many years, previously seen by cardiology 10 years ago or more Symptoms do not seem to be getting worse, seem to come and go no more rhyme or reason We have offered echocardiogram but she was told one was fine in the past.  Clinical exam essentially normal Low risk for cardiac disease -Discussed etiology of PVCs She does not want a medication every day, suggested she try low-dose beta-blocker on an as-needed basis She preferred propranolol over metoprolol She will take this as needed  10- 20 mg as needed  Addendum: Monitor arrived after her leaving the office Normal sinus rhythm, 1 run SVT/atrial tachycardia rate 130 bpm lasting 13 beats total Frequent patient triggered events Likely from PVCs  Anxiety Reports having prior anxiety attack [place] Symptoms have been better  Diabetes on insulin Did not eat breakfast today sugar down to 59 despite turning off her insulin pump In the office today she was given glucose tab, granola bar  numbers up to 120 at the time of leaving the office She was asymptomatic throughout Stressed the importance of not missing any meals  Hypertension Numbers relatively well controlled Potassium within reasonable range  Disposition:  F/U  12 months   Total encounter time more than 60 minutes  Greater than 50% was spent in counseling and coordination of care with the patient    Signed, Esmond Plants, M.D., Ph.D. [place], [place]

## 2019-07-12 ENCOUNTER — Emergency Department (HOSPITAL_COMMUNITY)
Admission: EM | Admit: 2019-07-12 | Discharge: 2019-07-13 | Disposition: A | Discharge status: Home / Self Care | Payer: Medicare Other | Attending: Emergency Medicine | Admitting: Emergency Medicine

## 2019-07-12 ENCOUNTER — Emergency Department (HOSPITAL_COMMUNITY): Payer: Medicare Other

## 2019-07-12 ENCOUNTER — Other Ambulatory Visit: Payer: Self-pay

## 2019-07-12 ENCOUNTER — Encounter (HOSPITAL_COMMUNITY): Payer: Self-pay

## 2019-07-12 DIAGNOSIS — Z7982 Long term (current) use of aspirin: Secondary | ICD-10-CM | POA: Diagnosis not present

## 2019-07-12 DIAGNOSIS — Y9389 Activity, other specified: Secondary | ICD-10-CM | POA: Insufficient documentation

## 2019-07-12 DIAGNOSIS — E039 Hypothyroidism, unspecified: Secondary | ICD-10-CM | POA: Diagnosis not present

## 2019-07-12 DIAGNOSIS — Z79899 Other long term (current) drug therapy: Secondary | ICD-10-CM | POA: Insufficient documentation

## 2019-07-12 DIAGNOSIS — S99912A Unspecified injury of left ankle, initial encounter: Secondary | ICD-10-CM | POA: Diagnosis present

## 2019-07-12 DIAGNOSIS — I10 Essential (primary) hypertension: Secondary | ICD-10-CM | POA: Insufficient documentation

## 2019-07-12 DIAGNOSIS — Z794 Long term (current) use of insulin: Secondary | ICD-10-CM | POA: Insufficient documentation

## 2019-07-12 DIAGNOSIS — E119 Type 2 diabetes mellitus without complications: Secondary | ICD-10-CM | POA: Diagnosis not present

## 2019-07-12 DIAGNOSIS — Z03818 Encounter for observation for suspected exposure to other biological agents ruled out: Secondary | ICD-10-CM | POA: Diagnosis not present

## 2019-07-12 DIAGNOSIS — Y92019 Unspecified place in single-family (private) house as the place of occurrence of the external cause: Secondary | ICD-10-CM | POA: Diagnosis not present

## 2019-07-12 DIAGNOSIS — S82852A Displaced trimalleolar fracture of left lower leg, initial encounter for closed fracture: Secondary | ICD-10-CM | POA: Diagnosis not present

## 2019-07-12 DIAGNOSIS — Y999 Unspecified external cause status: Secondary | ICD-10-CM | POA: Diagnosis not present

## 2019-07-12 DIAGNOSIS — Z20828 Contact with and (suspected) exposure to other viral communicable diseases: Secondary | ICD-10-CM | POA: Diagnosis not present

## 2019-07-12 DIAGNOSIS — X500XXA Overexertion from strenuous movement or load, initial encounter: Secondary | ICD-10-CM | POA: Insufficient documentation

## 2019-07-12 DIAGNOSIS — M25562 Pain in left knee: Secondary | ICD-10-CM | POA: Diagnosis not present

## 2019-07-12 LAB — CBC WITH DIFFERENTIAL/PLATELET
Abs Immature Granulocytes: 0.04 10*3/uL (ref 0.00–0.07)
Basophils Absolute: 0.1 10*3/uL (ref 0.0–0.1)
Basophils Relative: 1 %
Eosinophils Absolute: 0.1 10*3/uL (ref 0.0–0.5)
Eosinophils Relative: 1 %
HCT: 46.3 % — ABNORMAL HIGH (ref 36.0–46.0)
Hemoglobin: 15.6 g/dL — ABNORMAL HIGH (ref 12.0–15.0)
Immature Granulocytes: 0 %
Lymphocytes Relative: 17 %
Lymphs Abs: 1.6 10*3/uL (ref 0.7–4.0)
MCH: 30.2 pg (ref 26.0–34.0)
MCHC: 33.7 g/dL (ref 30.0–36.0)
MCV: 89.6 fL (ref 80.0–100.0)
Monocytes Absolute: 0.5 10*3/uL (ref 0.1–1.0)
Monocytes Relative: 5 %
Neutro Abs: 7.2 10*3/uL (ref 1.7–7.7)
Neutrophils Relative %: 76 %
Platelets: 211 10*3/uL (ref 150–400)
RBC: 5.17 MIL/uL — ABNORMAL HIGH (ref 3.87–5.11)
RDW: 13 % (ref 11.5–15.5)
WBC: 9.5 10*3/uL (ref 4.0–10.5)
nRBC: 0 % (ref 0.0–0.2)

## 2019-07-12 MED ORDER — ONDANSETRON HCL 4 MG/2ML IJ SOLN
4.0000 mg | Freq: Once | INTRAMUSCULAR | Status: AC
  Administered 2019-07-12: 4 mg via INTRAVENOUS
  Filled 2019-07-12: qty 2

## 2019-07-12 MED ORDER — FENTANYL CITRATE (PF) 100 MCG/2ML IJ SOLN
100.0000 ug | Freq: Once | INTRAMUSCULAR | Status: AC
  Administered 2019-07-12: 100 ug via INTRAVENOUS
  Filled 2019-07-12: qty 2

## 2019-07-12 NOTE — ED Provider Notes (Signed)
[facility] EMERGENCY DEPARTMENT Provider Note   CSN: [id number] Arrival date & time: 07/12/19  2212     History Chief Complaint  Patient presents with  . Left ankle injury    Jamie Mays is a 68 y.o. female.  HPI Jamie Mays is a 68 y.o. female with a medical history of arthritis, dm, htn who presents to the ED for left ankle injury and deformity. She reports was letting out her cat and accidentally rolled her ankle having sudden onset of pain and deformity to her left ankle. She denies any other injuries or pain. Fracture is closed. She was brought in by ems with a splint applied and they treated her with pain medicine. She does not take blood thinners, denies hitting her head or loc.      Past Medical History:  Diagnosis Date  . Arthritis   . Diabetes mellitus   . Family history of MI (myocardial infarction)   . Hypertension   . PVC (premature ventricular contraction)   . Smoker   . Syncope     Patient Active Problem List   Diagnosis Date Noted  . Angina decubitus ([place]) 11/06/2017  . Dyslipidemia 11/06/2017  . Well woman exam 03/28/2015  . Hypothyroidism 03/28/2015  . Labyrinthitis 07/23/2011  . Hip arthritis 05/10/2011  . Left shoulder pain 05/10/2011  . Fatty liver 02/04/2011  . Hip pain, left 01/22/2011  . HYPERLIPIDEMIA 08/06/2008  . Adhesive capsulitis of shoulder 08/06/2008  . Diabetes type 2, controlled ([place]) 04/30/2006  . CARPAL TUNNEL SYNDROME 04/30/2006  . Essential hypertension 04/30/2006  . TOBACCO USE, QUIT 04/30/2006    Past Surgical History:  Procedure Laterality Date  . CARDIAC CATHETERIZATION  2007   neg  . CESAREAN SECTION       OB History   No obstetric history on file.     Family History  Problem Relation Age of Onset  . Coronary artery disease Mother   . Kidney disease Mother   . Diabetes Mother   . Heart disease Mother        heart attack  . Hypertension Mother   . Diabetes Sister   . Diabetes  Brother   . Cancer Brother        lung cancer  . Diabetes Maternal Grandmother   . Cancer Brother 4       lungs, brain, chest wall, liver cancer, stage 4  . Cancer Other 70       lung , brain    Social History   Tobacco Use  . Smoking status: Former Smoker    Quit date: 07/12/1981    Years since quitting: 38.0  . Smokeless tobacco: Never Used  Substance Use Topics  . Alcohol use: Yes    Alcohol/week: 0.0 standard drinks    Comment: glass wine every 6 mths.  . Drug use: No    Home Medications Prior to Admission medications   Medication Sig Start Date End Date Taking? Authorizing Provider  acetaminophen (TYLENOL) 325 MG tablet Take 650 mg by mouth every 6 (six) hours as needed for mild pain.    [provider]  aspirin 81 MG tablet Take 81 mg by mouth daily.      [provider]  cycloSPORINE (RESTASIS) 0.05 % ophthalmic emulsion Place 1 drop into both eyes 2 (two) times daily.    [provider]  glucose blood test strip 1 each by Other route 6 (six) times daily. Use as instructed  [provider]  hydrochlorothiazide (MICROZIDE) 12.5 MG capsule Take 12.5 mg by mouth daily. 06/02/19   [provider]  ibuprofen (ADVIL,MOTRIN) 200 MG tablet Take 200 mg by mouth every 6 (six) hours as needed for moderate pain.    [provider]  insulin aspart (NOVOLOG) 100 UNIT/ML injection Inject into the skin See admin instructions. Via insulin pump as directed    [provider]  irbesartan (AVAPRO) 150 MG tablet Take 150 mg by mouth daily.    [provider]  levothyroxine (SYNTHROID, LEVOTHROID) 75 MCG tablet Take 1 tablet (75 mcg total) by mouth daily. 03/29/16   Dickie La, MD  Menthol, Topical Analgesic, (BIOFREEZE EX) Apply 1 application topically daily as needed (shoulder/hip pain).    [provider]  Omega-3 Fatty Acids (FISH OIL ADULT GUMMIES PO) Take 1 each by mouth daily.    [provider]    oxyCODONE-acetaminophen (PERCOCET/ROXICET) 5-325 MG tablet Take 1 tablet by mouth every 6 (six) hours as needed for severe pain. 07/13/19   Lawyer, Harrell Gave, PA-C  propranolol (INDERAL) 20 MG tablet Take 1 tablet (20 mg total) by mouth 3 (three) times daily as needed. 06/18/19   Minna Merritts, MD  TURMERIC PO Take 1 tablet by mouth daily.    [provider]  Vitamin D, Ergocalciferol, (DRISDOL) 1.25 MG (50000 UT) CAPS capsule Take 50,000 Units by mouth once a week. 03/14/19   [provider]    Allergies    Doxycycline and Lisinopril  Review of Systems   Review of Systems  Constitutional: Negative for chills and fever.  HENT: Negative for ear pain and sore throat.   Eyes: Negative for pain and visual disturbance.  Respiratory: Negative for cough and shortness of breath.   Cardiovascular: Negative for chest pain and palpitations.  Gastrointestinal: Negative for abdominal pain and vomiting.  Genitourinary: Negative for dysuria and hematuria.  Musculoskeletal: Positive for gait problem and joint swelling. Negative for arthralgias and back pain.       Left ankle pain, deformity and swelling  Skin: Negative for color change and rash.  Neurological: Negative for seizures and syncope.  All other systems reviewed and are negative.   Physical Exam Updated Vital Signs BP 115/80   Pulse 82   Temp 98.5 F (36.9 C) (Oral)   Resp 15   Ht 5\' 6"  (1.676 m)   Wt 68.9 kg   SpO2 96%   BMI 24.53 kg/m   Physical Exam Vitals and nursing note reviewed.  Constitutional:      General: She is not in acute distress.    Appearance: Normal appearance. She is well-developed. She is not ill-appearing.  HENT:     Head: Normocephalic and atraumatic.     Right Ear: External ear normal.     Left Ear: External ear normal.     Nose: Nose normal. No rhinorrhea.     Mouth/Throat:     Mouth: Mucous membranes are moist.  Eyes:     General:        Right eye: No discharge.        Left  eye: No discharge.     Conjunctiva/sclera: Conjunctivae normal.  Cardiovascular:     Rate and Rhythm: Normal rate and regular rhythm.     Pulses: Normal pulses.     Heart sounds: Normal heart sounds. No murmur.  Pulmonary:     Effort: Pulmonary effort is normal. No respiratory distress.     Breath sounds: Normal  breath sounds. No wheezing or rales.  Abdominal:     General: Abdomen is flat. There is no distension.     Palpations: Abdomen is soft.     Tenderness: There is no abdominal tenderness.  Musculoskeletal:        General: Swelling, tenderness, deformity and signs of injury present. Normal range of motion.     Cervical back: Normal range of motion and neck supple.     Comments: Left ankle with obvious deformity, no open fracture. Neurovascularly intact distally. DP pulses 2+ bilaterally, sensation and motor intact. No signs of trauma to her knee or upper leg  Skin:    General: Skin is warm and dry.     Capillary Refill: Capillary refill takes less than 2 seconds.     Coloration: Skin is not jaundiced.  Neurological:     General: No focal deficit present.     Mental Status: She is alert. Mental status is at baseline.  Psychiatric:        Mood and Affect: Mood normal.        Behavior: Behavior normal.     ED Results / Procedures / Treatments   Labs (all labs ordered are listed, but only abnormal results are displayed) Labs Reviewed  CBC WITH DIFFERENTIAL/PLATELET - Abnormal; Notable for the following components:      Result Value   RBC 5.17 (*)    Hemoglobin 15.6 (*)    HCT 46.3 (*)    All other components within normal limits  BASIC METABOLIC PANEL - Abnormal; Notable for the following components:   Glucose, Bld 206 (*)    BUN 24 (*)    All other components within normal limits  SARS CORONAVIRUS 2 (TAT 6-24 HRS)    EKG EKG Interpretation  Date/Time:  Thursday July 12 2019 22:22:16 EST Ventricular Rate:  67 PR Interval:    QRS Duration: 150 QT  Interval:  446 QTC Calculation: 471 R Axis:   117 Text Interpretation: Sinus rhythm RBBB and LPFB Confirmed by Malvin Johns [phone]) on 07/13/2019 12:04:59 AM Also confirmed by Malvin Johns [phone]), editor Hattie Perch ([id number])  on 07/14/2019 12:25:30 PM   Radiology DG Knee 2 Views Left  Result Date: 07/12/2019 CLINICAL DATA:  Pain EXAM: LEFT KNEE - 1-2 VIEW COMPARISON:  None. FINDINGS: No evidence of fracture, dislocation, or joint effusion. No evidence of arthropathy or other focal bone abnormality. Soft tissues are unremarkable. IMPRESSION: Negative. Electronically Signed   By: Constance Holster M.D.   On: 07/12/2019 22:58   DG Tibia/Fibula Left  Result Date: 07/13/2019 CLINICAL DATA:  Post reduction left ankle. Trimalleolar fracture. EXAM: LEFT TIBIA AND FIBULA - 2 VIEW COMPARISON:  Radiographs yesterday. FINDINGS: Trimalleolar fracture, not significantly changed in alignment from preoperative exam. Talus remains laterally displaced with respect to the tibial plafond. Persistent displacement of medial malleolar, distal fibular, and posterior malleolar fractures. Overlying splint material in place. Proximal tibia and fibular intact. IMPRESSION: Unchanged alignment of displaced trimalleolar fracture. Talus remains laterally subluxed with respect to the tibial plafond. Electronically Signed   By: Keith Rake M.D.   On: 07/13/2019 00:36   DG Tibia/Fibula Left  Result Date: 07/12/2019 CLINICAL DATA:  Pain EXAM: LEFT TIBIA AND FIBULA - 2 VIEW COMPARISON:  None. FINDINGS: Again noted is an acute displaced trimalleolar fracture dislocation of the left ankle with surrounding soft tissue swelling. This is better appreciated on the ankle radiographs. There is no displaced fracture involving the proximal tibia or fibula. IMPRESSION: Acute trimalleolar  fracture dislocation of the left ankle with surrounding soft tissue swelling. No displaced fracture of the proximal tibia or fibula.  Electronically Signed   By: Constance Holster M.D.   On: 07/12/2019 22:58   DG Ankle Complete Left  Result Date: 07/13/2019 CLINICAL DATA:  Evaluate for fracture. Post reduction. EXAM: LEFT ANKLE COMPLETE - 3+ VIEW COMPARISON:  Postreduction tibia/fibular radiographs earlier this Jamie Mays. Pre reduction radiographs yesterday. FINDINGS: Improved alignment of trimalleolar fracture. Slight decrease lateral subluxation of the talus and distal medial malleolar fragment from prior exam. Improved alignment of posterior tibial tubercle fracture. Minimal residual widening of the medial clear space. No new fracture. Overlying splint material in place. IMPRESSION: Improved alignment of trimalleolar fracture with minimal residual lateral subluxation of the talus and distal medial malleolar fragment. Overlying splint material in place. Electronically Signed   By: Keith Rake M.D.   On: 07/13/2019 04:56   DG Ankle Complete Left  Result Date: 07/12/2019 CLINICAL DATA:  Pain EXAM: LEFT ANKLE COMPLETE - 3+ VIEW COMPARISON:  None. FINDINGS: There is an acute displaced trimalleolar fracture dislocation of the left ankle. The talus is displaced laterally with respect to the tibia. Are identified involving the medial and lateral malleoli. There is extensive surrounding soft tissue swelling. There is a large plantar calcaneal spur. There is a mildly displaced posterior malleolar fracture. IMPRESSION: Acute displaced trimalleolar fracture dislocation of the left ankle with extensive surrounding soft tissue swelling. Electronically Signed   By: Constance Holster M.D.   On: 07/12/2019 22:57    Procedures Procedures (including critical care time)  Medications Ordered in ED Medications  fentaNYL (SUBLIMAZE) injection 100 mcg (100 mcg Intravenous Given 07/12/19 2352)  ondansetron (ZOFRAN) injection 4 mg (4 mg Intravenous Given 07/12/19 2352)  propofol (DIPRIVAN) 10 mg/mL bolus/IV push (35 mg Intravenous Given 07/13/19 0344)     ED Course  I have reviewed the triage vital signs and the nursing notes.  Pertinent labs & imaging results that were available during my care of the patient were reviewed by me and considered in my medical decision making (see chart for details).    MDM Rules/Calculators/A&P                      VALARIA DELSIGNORE is a 68 y.o. female with a medical history of arthritis, dm, htn who presents to the ED for left ankle injury and deformity. She reports was letting out her cat and accidentally rolled her ankle having sudden onset of pain and deformity to her left ankle. She denies any other injuries or pain. Fracture is closed. She was brought in by ems with a splint applied and they treated her with pain medicine. She does not take blood thinners, denies hitting her head or loc.  HPI and physical exam as above. She presents awake, alert, hemodynamically stable, afebrile, non toxic. Left ankle with obvious deformity, no open fracture. Neurovascularly intact distally. DP pulses 2+ bilaterally, sensation and motor intact. No signs of trauma to her knee or upper leg.   Acute displaced trimalleolar fracture dislocation of the left ankle with extensive surrounding soft tissue swelling. Reduced and splinted her ankle, at time of signout pending post reduction xrays. She will need follow up with orthopedics for further management of her ankle.  Pt care was handed off to [employer] at 0001.  Complete history and physical and current plan have been communicated.  Please refer to their note for the remainder of ED care and ultimate  disposition. The above statements and care plan were discussed with my attending physician Dr Tamera Punt who voiced agreement with plan.    Final Clinical Impression(s) / ED Diagnoses Final diagnoses:  Closed trimalleolar fracture of left ankle, initial encounter    Rx / DC Orders ED Discharge Orders         Ordered    oxyCODONE-acetaminophen (PERCOCET/ROXICET) 5-325 MG  tablet  Every 6 hours PRN     07/13/19 0503           Charlisha Market, Lovena Le, MD 07/14/19 1341    Malvin Johns, MD 07/18/19 1513

## 2019-07-12 NOTE — ED Triage Notes (Signed)
Pt arrives via [facility] c/o left ankle injury (deformity noted by EMS, PMS intact). Injury sustained from fall down 3 steps outside. No LOC or head injury. VS: BP 142/78, HR 82, SPO2 97% RA, CBG 146, RR 15. EMS administered 150 mcg fentanyl IV.

## 2019-07-13 ENCOUNTER — Emergency Department (HOSPITAL_COMMUNITY): Payer: Medicare Other

## 2019-07-13 DIAGNOSIS — E039 Hypothyroidism, unspecified: Secondary | ICD-10-CM | POA: Diagnosis not present

## 2019-07-13 DIAGNOSIS — Y92019 Unspecified place in single-family (private) house as the place of occurrence of the external cause: Secondary | ICD-10-CM | POA: Diagnosis not present

## 2019-07-13 DIAGNOSIS — S82852A Displaced trimalleolar fracture of left lower leg, initial encounter for closed fracture: Secondary | ICD-10-CM | POA: Diagnosis not present

## 2019-07-13 DIAGNOSIS — I10 Essential (primary) hypertension: Secondary | ICD-10-CM | POA: Diagnosis not present

## 2019-07-13 DIAGNOSIS — E119 Type 2 diabetes mellitus without complications: Secondary | ICD-10-CM | POA: Diagnosis not present

## 2019-07-13 DIAGNOSIS — X500XXA Overexertion from strenuous movement or load, initial encounter: Secondary | ICD-10-CM | POA: Diagnosis not present

## 2019-07-13 DIAGNOSIS — Z794 Long term (current) use of insulin: Secondary | ICD-10-CM | POA: Diagnosis not present

## 2019-07-13 DIAGNOSIS — Z7982 Long term (current) use of aspirin: Secondary | ICD-10-CM | POA: Diagnosis not present

## 2019-07-13 DIAGNOSIS — Z79899 Other long term (current) drug therapy: Secondary | ICD-10-CM | POA: Diagnosis not present

## 2019-07-13 DIAGNOSIS — Y9389 Activity, other specified: Secondary | ICD-10-CM | POA: Diagnosis not present

## 2019-07-13 DIAGNOSIS — Y999 Unspecified external cause status: Secondary | ICD-10-CM | POA: Diagnosis not present

## 2019-07-13 DIAGNOSIS — Z20828 Contact with and (suspected) exposure to other viral communicable diseases: Secondary | ICD-10-CM | POA: Diagnosis not present

## 2019-07-13 DIAGNOSIS — S99912A Unspecified injury of left ankle, initial encounter: Secondary | ICD-10-CM | POA: Diagnosis present

## 2019-07-13 DIAGNOSIS — Z20822 Contact with and (suspected) exposure to covid-19: Secondary | ICD-10-CM | POA: Diagnosis not present

## 2019-07-13 LAB — SARS CORONAVIRUS 2 (TAT 6-24 HRS): SARS Coronavirus 2: NEGATIVE

## 2019-07-13 LAB — BASIC METABOLIC PANEL
Anion gap: 10 (ref 5–15)
BUN: 24 mg/dL — ABNORMAL HIGH (ref 8–23)
CO2: 27 mmol/L (ref 22–32)
Calcium: 10 mg/dL (ref 8.9–10.3)
Chloride: 103 mmol/L (ref 98–111)
Creatinine, Ser: 0.81 mg/dL (ref 0.44–1.00)
GFR calc Af Amer: >60 mL/min (ref >60)
GFR calc non Af Amer: >60 mL/min (ref >60)
Glucose, Bld: 206 mg/dL — ABNORMAL HIGH (ref 70–99)
Potassium: 4.8 mmol/L (ref 3.5–5.1)
Sodium: 140 mmol/L (ref 135–145)

## 2019-07-13 MED ORDER — PROPOFOL 10 MG/ML IV BOLUS
1.0000 mg/kg | Freq: Once | INTRAVENOUS | Status: DC
  Filled 2019-07-13: qty 20

## 2019-07-13 MED ORDER — OXYCODONE-ACETAMINOPHEN 5-325 MG PO TABS: 1.0000 | ORAL_TABLET | Freq: Four times a day (QID) | ORAL | 0 refills | Status: DC | PRN

## 2019-07-13 MED ORDER — PROPOFOL 10 MG/ML IV BOLUS
INTRAVENOUS | Status: AC | PRN
  Administered 2019-07-13 (×3): 35 mg via INTRAVENOUS

## 2019-07-13 NOTE — ED Provider Notes (Signed)
Physical Exam  BP 130/60   Pulse 79   Resp 15   Ht 5\' 6"  (1.676 m)   Wt 68.9 kg   SpO2 96%   BMI 24.53 kg/m   Physical Exam HENT:     Mouth/Throat:     Mouth: Mucous membranes are dry.     Pharynx: Oropharynx is clear.  Musculoskeletal:        General: Swelling, tenderness and deformity present.     Left lower leg: Edema present.  Skin:    General: Skin is warm and dry.     Comments: Abrasion to left medial ankle     ED Course/Procedures     .Sedation  Date/Time: 07/13/2019 7:15 AM Performed by: Merrily Pew, MD Authorized by: Merrily Pew, MD   Consent:    Consent obtained:  Verbal   Consent given by:  Patient   Risks discussed:  Allergic reaction, dysrhythmia, inadequate sedation, nausea, prolonged hypoxia resulting in organ damage, prolonged sedation necessitating reversal, respiratory compromise necessitating ventilatory assistance and intubation and vomiting   Alternatives discussed:  Analgesia without sedation, anxiolysis and regional anesthesia Universal protocol:    Procedure explained and questions answered to patient or proxy's satisfaction: yes     Relevant documents present and verified: yes     Test results available and properly labeled: yes     Imaging studies available: yes     Required blood products, implants, devices, and special equipment available: yes     Site/side marked: yes     Immediately prior to procedure a time out was called: yes     Patient identity confirmation method:  Verbally with patient Indications:    Procedure performed:  Fracture reduction   Procedure necessitating sedation performed by:  Physician performing sedation Pre-sedation assessment:    Time since last food or drink:  Many hours   ASA classification: class 1 - normal, healthy patient     Neck mobility: normal     Mouth opening:  3 or more finger widths   Thyromental distance:  4 finger widths   Mallampati score:  I - soft palate, uvula, fauces, pillars  visible   Pre-sedation assessments completed and reviewed: airway patency, cardiovascular function, hydration status, mental status, nausea/vomiting, pain level, respiratory function and temperature   Immediate pre-procedure details:    Reassessment: Patient reassessed immediately prior to procedure     Reviewed: vital signs, relevant labs/tests and NPO status     Verified: bag valve mask available, emergency equipment available, intubation equipment available, IV patency confirmed, oxygen available and suction available   Procedure details (see MAR for exact dosages):    Preoxygenation:  Nasal cannula   Sedation:  Propofol   Intended level of sedation: deep   Intra-procedure monitoring:  Blood pressure monitoring, cardiac monitor, continuous pulse oximetry, frequent LOC assessments, frequent vital sign checks and continuous capnometry   Intra-procedure events: none     Total Provider sedation time (minutes):  19 Post-procedure details:    Attendance: Constant attendance by certified staff until patient recovered     Recovery: Patient returned to pre-procedure baseline     Post-sedation assessments completed and reviewed: airway patency, cardiovascular function, hydration status, mental status, nausea/vomiting, pain level, respiratory function and temperature     Patient is stable for discharge or admission: yes     Patient tolerance:  Tolerated well, no immediate complications    MDM   Initial reduction not adequate. We sedated, PA reduced with significant improvement. Woke up appropriately without  symptoms. Plan for outpatient treatment per previous provider plan and discussions with Dr. Doreatha Martin.        Joell Buerger, Corene Cornea, MD 07/13/19 [phone]

## 2019-07-13 NOTE — Progress Notes (Signed)
Orthopedic Tech Progress Note Patient Details:  Jamie Mays 01-28-1951 [id number]  Ortho Devices Type of Ortho Device: Ace wrap, Stirrup splint, Post (short leg) splint Splint Material: Fiberglass Ortho Device/Splint Location: LLE Ortho Device/Splint Interventions: Ordered, Application   Post Interventions Patient Tolerated: Well Instructions Provided: Care of device   Staci Righter 07/13/2019, 4:05 AM

## 2019-07-13 NOTE — Progress Notes (Signed)
Orthopedic Tech Progress Note Patient Details:  Jamie Mays 1950/09/01 [id number] Patient had another x-ray. More reduction and new splint applied. Patient ID: Jamie Mays, female   DOB: 12-05-1950, 69 y.o.   MRN: [id number]   Staci Righter 07/13/2019, 4:00 AM

## 2019-07-13 NOTE — ED Notes (Signed)
Pt advises husband will provide transportation upon discharge.

## 2019-07-13 NOTE — Discharge Instructions (Signed)
Return here as needed.  Call the orthopedic doctors office for an appointment for the first part of next week.

## 2019-07-13 NOTE — ED Notes (Signed)
Patient verbalizes understanding of discharge instructions. Opportunity for questioning and answers were provided. Armband removed by staff, pt discharged from ED.  

## 2019-07-13 NOTE — ED Provider Notes (Signed)
  Physical Exam  BP 115/80   Pulse 82   Temp 98.5 F (36.9 C) (Oral)   Resp 15   Ht 5\' 6"  (1.676 m)   Wt 68.9 kg   SpO2 96%   BMI 24.53 kg/m   Physical Exam  ED Course/Procedures     .Ortho Injury Treatment  Date/Time: 07/13/2019 6:15 AM Performed by: Dalia Heading, PA-C Authorized by: Dalia Heading, PA-C   Consent:    Consent obtained:  Verbal and written   Consent given by:  Patient   Risks discussed:  Irreducible dislocation, nerve damage, recurrent dislocation, vascular damage and stiffness   Alternatives discussed:  No treatment, alternative treatment, immobilization, referral and delayed treatmentInjury location: lower leg Location details: left lower leg Injury type: fracture-dislocation Pre-procedure neurovascular assessment: neurovascularly intact Pre-procedure distal perfusion: normal Pre-procedure neurological function: normal Pre-procedure range of motion: reduced  Anesthesia: Local anesthesia used: no  Patient sedated: Yes. Refer to sedation procedure documentation for details of sedation. Manipulation performed: yes Skeletal traction used: yes Reduction successful: yes X-ray confirmed reduction: yes Immobilization: splint and crutches Splint type: short leg Supplies used: cotton padding,  elastic bandage and Ortho-Glass Post-procedure neurovascular assessment: post-procedure neurovascularly intact Post-procedure distal perfusion: normal Post-procedure neurological function: normal Patient tolerance: patient tolerated the procedure well with no immediate complications     MDM         Dalia Heading, PA-C 07/13/19 0617    Mesner, Corene Cornea, MD 07/13/19 [phone]

## 2019-07-13 NOTE — ED Notes (Signed)
Patient transported to X-ray 

## 2019-07-17 ENCOUNTER — Ambulatory Visit: Payer: Self-pay | Admitting: Student

## 2019-07-17 DIAGNOSIS — S82852A Displaced trimalleolar fracture of left lower leg, initial encounter for closed fracture: Secondary | ICD-10-CM

## 2019-07-17 DIAGNOSIS — S8292XA Unspecified fracture of left lower leg, initial encounter for closed fracture: Secondary | ICD-10-CM | POA: Diagnosis not present

## 2019-07-18 ENCOUNTER — Other Ambulatory Visit: Payer: Self-pay

## 2019-07-18 ENCOUNTER — Encounter (HOSPITAL_COMMUNITY): Payer: Self-pay | Admitting: Student

## 2019-07-18 ENCOUNTER — Ambulatory Visit: Payer: Medicare Other

## 2019-07-18 ENCOUNTER — Other Ambulatory Visit (HOSPITAL_COMMUNITY)
Admission: RE | Admit: 2019-07-18 | Discharge: 2019-07-18 | Disposition: A | Discharge status: Home / Self Care | Payer: Medicare Other | Source: Ambulatory Visit | Attending: Student | Admitting: Student

## 2019-07-18 DIAGNOSIS — Z20822 Contact with and (suspected) exposure to covid-19: Secondary | ICD-10-CM | POA: Insufficient documentation

## 2019-07-18 DIAGNOSIS — Z01812 Encounter for preprocedural laboratory examination: Secondary | ICD-10-CM | POA: Diagnosis not present

## 2019-07-18 LAB — SARS CORONAVIRUS 2 (TAT 6-24 HRS): SARS Coronavirus 2: NEGATIVE

## 2019-07-18 NOTE — Progress Notes (Signed)
Pt denies SOB, chest pain, and being under the care of a cardiologist. Pt stated that she is under the care of Dr. Reynold Bowen, PCP and Endocrinologist. Pt denies having a chest x ray in the last year. Pt made aware to decrease basal rate on insulin pump at [facility] by 20% if not provided with pre-op instructions by endocrinologist. Pt stated that she does not take Aspirin. Pt made aware to stop taking  vitamins, fish oil, Turmeric and herbal medications. Do not take any NSAIDs ie: Ibuprofen, Advil, Naproxen (Aleve), Motrin, BC and Goody Powder. Pt made aware to check CBG prior to arrival to hospital on DOS. Pt made aware to treat a BG < 70 with 4 ounces of apple juice, wait 15 minutes after intervention to recheck BG, if BG remains < 70, call Short Stay unit to speak with a nurse. Pt verbalized understanding of all pre-op instructions. PA, Anesthesiology, asked to review EKG.

## 2019-07-18 NOTE — H&P (Signed)
Orthopaedic Trauma Service (OTS) H&P  Patient ID: Jamie Mays MRN: [id number] DOB/AGE: 70-Aug-1952 70 y.o.  Reason for surgery: Left trimalleolar ankle fracture  HPI: Jamie Mays is an 69 y.o. female with a past medical history of type 1 diabetes and hypertension is presenting for surgical fixation of left ankle.  Patient states that she was letting her cat out on 07/12/2019 when she slipped on the outdoor steps and rolled her ankle.  She had sudden onset of pain and deformity to the left ankle.  She was brought to [facility] emergency department where imaging revealed a trimalleolar ankle fracture.  Orthopedic trauma service was consulted and were able to perform a closed reduction of the fracture in the emergency department.  She was placed in a short leg splint and discharged with instructions to follow-up with Dr. Doreatha Martin in the OTS clinic.  Patient presented to the OTS clinic on 07/17/2019.  Short leg splint still in place.  She has been compliant with her nonweightbearing precautions of the left lower extremity.  Pain has been minimal and well controlled with Tylenol.  Patient lives at home with her husband in [place], [place].  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  Patient is on insulin pump for her type 1 diabetes.  Denies any neuropathy at baseline.  On no blood thinners.  Past Medical History:  Diagnosis Date  . Ankle fracture    left  . Arthritis   . Diabetes mellitus   . Family history of adverse reaction to anesthesia    " they had difficulty aking my dad up; he was about 80-44 years old."  . Family history of MI (myocardial infarction)   . GERD (gastroesophageal reflux disease)   . Hypertension   . Hypothyroidism   . PVC (premature ventricular contraction)   . Smoker    former  " quit about 40 years ago"  . Syncope   . Wears glasses     Past Surgical History:  Procedure Laterality Date  . CARDIAC CATHETERIZATION  2007   neg  . CATARACT EXTRACTION W/ INTRAOCULAR  LENS  IMPLANT, BILATERAL    . CESAREAN SECTION      Family History  Problem Relation Age of Onset  . Coronary artery disease Mother   . Kidney disease Mother   . Diabetes Mother   . Heart disease Mother        heart attack  . Hypertension Mother   . Diabetes Sister   . Diabetes Brother   . Cancer Brother        lung cancer  . Diabetes Maternal Grandmother   . Cancer Brother 29       lungs, brain, chest wall, liver cancer, stage 4  . Cancer Other 70       lung , brain    Social History:  reports that she quit smoking about 38 years ago. Her smoking use included cigarettes. She has never used smokeless tobacco. She reports current alcohol use. She reports that she does not use drugs.  Allergies:  Allergies  Allergen Reactions  . Doxycycline Other (See Comments)    Joint pain  . Lisinopril Cough    Medications: I have reviewed the patient's current medications.  ROS: Constitutional: No fever or chills Vision: No changes in vision ENT: No difficulty swallowing CV: No chest pain Pulm: No SOB or wheezing GI: No nausea or vomiting GU: No urgency or inability to hold urine Skin: No poor wound healing Neurologic: No  numbness or tingling Psychiatric: No depression or anxiety Heme: No bruising Allergic: No reaction to medications or food   Exam: There were no vitals taken for this visit. General: No acute distress Orientation: Alert and oriented x3 Mood and Affect: Mood and affect appropriate, pleasant and cooperative Gait: Not assessed due to an infection Coordination and balance: Within normal limits   Left lower extremity: Short leg splint in place.  No significant swelling of the toes.  No knee effusion.  Able to wiggle toes.  Sensation is intact to light touch of the dorsum and plantar aspect of foot and toes.  Foot is warm and well-perfused.  Right lower extremity: Skin without lesions. No tenderness to palpation. Full painless ROM, full strength in each muscle  group without evidence of instability.  Motor and sensory function intact.  Otherwise neurovascularly intact.   Medical Decision Making: Data: Imaging: AP and lateral views of the left ankle show trimalleolar ankle fracture  Labs:  Results for orders placed or performed during the hospital encounter of 07/12/19 (from the past 168 hour(s))  CBC with Differential   Collection Time: 07/12/19 10:15 PM  Result Value Ref Range   WBC 9.5 4.0 - 10.5 K/uL   RBC 5.17 (H) 3.87 - 5.11 MIL/uL   Hemoglobin 15.6 (H) 12.0 - 15.0 g/dL   HCT 46.3 (H) 36.0 - 46.0 %   MCV 89.6 80.0 - 100.0 fL   MCH 30.2 26.0 - 34.0 pg   MCHC 33.7 30.0 - 36.0 g/dL   RDW 13.0 11.5 - 15.5 %   Platelets 211 150 - 400 K/uL   nRBC 0.0 0.0 - 0.2 %   Neutrophils Relative % 76 %   Neutro Abs 7.2 1.7 - 7.7 K/uL   Lymphocytes Relative 17 %   Lymphs Abs 1.6 0.7 - 4.0 K/uL   Monocytes Relative 5 %   Monocytes Absolute 0.5 0.1 - 1.0 K/uL   Eosinophils Relative 1 %   Eosinophils Absolute 0.1 0.0 - 0.5 K/uL   Basophils Relative 1 %   Basophils Absolute 0.1 0.0 - 0.1 K/uL   Immature Granulocytes 0 %   Abs Immature Granulocytes 0.04 0.00 - 0.07 K/uL  Basic metabolic panel   Collection Time: 07/12/19 10:15 PM  Result Value Ref Range   Sodium 140 135 - 145 mmol/L   Potassium 4.8 3.5 - 5.1 mmol/L   Chloride 103 98 - 111 mmol/L   CO2 27 22 - 32 mmol/L   Glucose, Bld 206 (H) 70 - 99 mg/dL   BUN 24 (H) 8 - 23 mg/dL   Creatinine, Ser 0.81 0.44 - 1.00 mg/dL   Calcium 10.0 8.9 - 10.3 mg/dL   GFR calc non Af Amer >60 >60 mL/min   GFR calc Af Amer >60 >60 mL/min   Anion gap 10 5 - 15  SARS CORONAVIRUS 2 (TAT 6-24 HRS) Nasopharyngeal Nasopharyngeal Swab   Collection Time: 07/12/19 11:18 PM   Specimen: Nasopharyngeal Swab  Result Value Ref Range   SARS Coronavirus 2 NEGATIVE NEGATIVE      Assessment/Plan: 69 year old female status post fall which resulted in a left trimalleolar ankle fracture.  Recommend proceeding with  open reduction internal fixation of the left ankle.  Risks and benefits of the procedure were discussed with patient.Risks discussed included bleeding, infection, malunion, nonunion, damage to surrounding nerves and blood vessels, pain, hardware prominence or irritation, hardware failure, stiffness, post-traumatic arthritis, and anesthesia complications.  Patient understands his risks and agrees to proceed with surgery.  All questions were answered.  Consent was obtained.  Patient will likely be placed in a short leg splint postoperatively and will remain nonweightbearing on the left lower extremity for a total of 6 weeks.  Patient will be discharged home from PACU postoperatively.  Atley Scarboro A. Carmie Kanner [facility] [phone] (office) [email or web address]

## 2019-07-18 NOTE — Progress Notes (Signed)
Anesthesia Chart Review: Same day workup  Follows with cardiology for history symptomatic PVCs, bifascicular block. Last seen by Dr. Rockey Situ 06/18/19. Per note her PVCs seem to be stable, clinical exam essentially normal, low risk for cardiac disease. She takes propanolol PRN. Recent event monitor showed Normal sinus rhythm, 1 run SVT/atrial tachycardia rate 130 bpm lasting 13 beats total. Frequent patient triggered events, likely from PVCs.  IDDMI on insulin pump. Advised to reduce basal 20% per protocol. Also advised to contact endocrinologist to to let them know she is having surgery.   EKG 07/12/19: Sinus rhythm. Rate 67. RBBB and LPFB.   Nuclear stres 11/07/17:  Blood pressure demonstrated a normal response to exercise.  There was no ST segment deviation noted during stress.  No T wave inversion was noted during stress.  Defect 1: There is a small defect of mild severity present in the apical anterior location.  This is a low risk study.  The left ventricular ejection fraction is normal (55-65%).  Nuclear stress EF: 61%.   Low risk stress nuclear study a small area of anteroapical ischemia (versus shifting breast attenuation artifact) and normal left ventricular regional and global systolic function.  Jamie Mays [facility] Short Stay Center/Anesthesiology Phone [phone] 07/18/2019 11:52 AM

## 2019-07-18 NOTE — Anesthesia Preprocedure Evaluation (Addendum)
Anesthesia Evaluation  Patient identified by MRN, date of birth, ID band Patient awake    Reviewed: Allergy & Precautions, NPO status , Patient's Chart, lab work & pertinent test results  History of Anesthesia Complications Negative for: history of anesthetic complications  Airway Mallampati: II  TM Distance: >3 FB Neck ROM: Full    Dental  (+) Teeth Intact   Pulmonary neg pulmonary ROS, former smoker,    Pulmonary exam normal        Cardiovascular hypertension, Pt. on medications Normal cardiovascular exam     Neuro/Psych negative neurological ROS  negative psych ROS   GI/Hepatic Neg liver ROS, GERD  ,  Endo/Other  diabetes, Type 1, Insulin DependentHypothyroidism   Renal/GU negative Renal ROS  negative genitourinary   Musculoskeletal negative musculoskeletal ROS (+)   Abdominal   Peds  Hematology negative hematology ROS (+)   Anesthesia Other Findings Nuclear stress test 11/07/17: low risk study, EF 55-65%, small area of anteroapical ischemia vs. Breast attenuation, normal regional and global systolic function  Reproductive/Obstetrics                          Anesthesia Physical Anesthesia Plan  ASA: III  Anesthesia Plan: General   Post-op Pain Management: GA combined w/ Regional for post-op pain   Induction: Intravenous  PONV Risk Score and Plan: 3 and Ondansetron, Dexamethasone, Treatment may vary due to age or medical condition and Midazolam  Airway Management Planned: Oral ETT and LMA  Additional Equipment: None  Intra-op Plan:   Post-operative Plan: Extubation in OR  Informed Consent: I have reviewed the patients History and Physical, chart, labs and discussed the procedure including the risks, benefits and alternatives for the proposed anesthesia with the patient or authorized representative who has indicated his/her understanding and acceptance.     Dental advisory  given  Plan Discussed with:   Anesthesia Plan Comments: (Follows with cardiology for history symptomatic PVCs, bifascicular block. Last seen by Dr. Rockey Situ 06/18/19. Per note her PVCs seem to be stable, clinical exam essentially normal, low risk for cardiac disease. She takes propanolol PRN. Recent event monitor showed Normal sinus rhythm, 1 run SVT/atrial tachycardia rate 130 bpm lasting 13 beats total. Frequent patient triggered events, likely from PVCs.  IDDMI on insulin pump. Advised to reduce basal 20% per protocol. Also advised to contact endocrinologist to to let them know she is having surgery.   EKG 07/12/19: Sinus rhythm. Rate 67. RBBB and LPFB.   Nuclear stres 11/07/17: Blood pressure demonstrated a normal response to exercise. There was no ST segment deviation noted during stress. No T wave inversion was noted during stress. Defect 1: There is a small defect of mild severity present in the apical anterior location. This is a low risk study. The left ventricular ejection fraction is normal (55-65%). Nuclear stress EF: 61%.   Low risk stress nuclear study a small area of anteroapical ischemia (versus shifting breast attenuation artifact) and normal left ventricular regional and global systolic function. )     Anesthesia Quick Evaluation

## 2019-07-19 ENCOUNTER — Ambulatory Visit (HOSPITAL_COMMUNITY): Payer: Medicare Other | Admitting: Physician Assistant

## 2019-07-19 ENCOUNTER — Other Ambulatory Visit: Payer: Self-pay

## 2019-07-19 ENCOUNTER — Encounter (HOSPITAL_COMMUNITY)
Admission: RE | Disposition: A | Discharge status: Home / Self Care | Payer: Self-pay | Source: Ambulatory Visit | Attending: Student

## 2019-07-19 ENCOUNTER — Ambulatory Visit (HOSPITAL_COMMUNITY): Payer: Medicare Other

## 2019-07-19 ENCOUNTER — Ambulatory Visit (HOSPITAL_COMMUNITY)
Admission: RE | Admit: 2019-07-19 | Discharge: 2019-07-19 | Disposition: A | Discharge status: Home / Self Care | Payer: Medicare Other | Source: Ambulatory Visit | Attending: Student | Admitting: Student

## 2019-07-19 ENCOUNTER — Encounter (HOSPITAL_COMMUNITY): Payer: Self-pay | Admitting: Student

## 2019-07-19 DIAGNOSIS — E109 Type 1 diabetes mellitus without complications: Secondary | ICD-10-CM | POA: Insufficient documentation

## 2019-07-19 DIAGNOSIS — S82852A Displaced trimalleolar fracture of left lower leg, initial encounter for closed fracture: Secondary | ICD-10-CM | POA: Diagnosis not present

## 2019-07-19 DIAGNOSIS — S8261XD Displaced fracture of lateral malleolus of right fibula, subsequent encounter for closed fracture with routine healing: Secondary | ICD-10-CM | POA: Diagnosis not present

## 2019-07-19 DIAGNOSIS — Z794 Long term (current) use of insulin: Secondary | ICD-10-CM | POA: Diagnosis not present

## 2019-07-19 DIAGNOSIS — Z87891 Personal history of nicotine dependence: Secondary | ICD-10-CM | POA: Insufficient documentation

## 2019-07-19 DIAGNOSIS — M1990 Unspecified osteoarthritis, unspecified site: Secondary | ICD-10-CM | POA: Insufficient documentation

## 2019-07-19 DIAGNOSIS — E039 Hypothyroidism, unspecified: Secondary | ICD-10-CM | POA: Diagnosis not present

## 2019-07-19 DIAGNOSIS — E119 Type 2 diabetes mellitus without complications: Secondary | ICD-10-CM | POA: Diagnosis not present

## 2019-07-19 DIAGNOSIS — Z9641 Presence of insulin pump (external) (internal): Secondary | ICD-10-CM | POA: Diagnosis not present

## 2019-07-19 DIAGNOSIS — I10 Essential (primary) hypertension: Secondary | ICD-10-CM | POA: Insufficient documentation

## 2019-07-19 DIAGNOSIS — S93432A Sprain of tibiofibular ligament of left ankle, initial encounter: Secondary | ICD-10-CM | POA: Diagnosis not present

## 2019-07-19 DIAGNOSIS — K219 Gastro-esophageal reflux disease without esophagitis: Secondary | ICD-10-CM | POA: Insufficient documentation

## 2019-07-19 DIAGNOSIS — S8252XD Displaced fracture of medial malleolus of left tibia, subsequent encounter for closed fracture with routine healing: Secondary | ICD-10-CM | POA: Diagnosis not present

## 2019-07-19 DIAGNOSIS — T148XXA Other injury of unspecified body region, initial encounter: Secondary | ICD-10-CM

## 2019-07-19 DIAGNOSIS — Z419 Encounter for procedure for purposes other than remedying health state, unspecified: Secondary | ICD-10-CM

## 2019-07-19 DIAGNOSIS — Z79890 Hormone replacement therapy (postmenopausal): Secondary | ICD-10-CM | POA: Insufficient documentation

## 2019-07-19 DIAGNOSIS — W010XXA Fall on same level from slipping, tripping and stumbling without subsequent striking against object, initial encounter: Secondary | ICD-10-CM | POA: Diagnosis not present

## 2019-07-19 DIAGNOSIS — Y92008 Other place in unspecified non-institutional (private) residence as the place of occurrence of the external cause: Secondary | ICD-10-CM | POA: Diagnosis not present

## 2019-07-19 DIAGNOSIS — S82892D Other fracture of left lower leg, subsequent encounter for closed fracture with routine healing: Secondary | ICD-10-CM | POA: Diagnosis not present

## 2019-07-19 DIAGNOSIS — S8262XD Displaced fracture of lateral malleolus of left fibula, subsequent encounter for closed fracture with routine healing: Secondary | ICD-10-CM | POA: Diagnosis not present

## 2019-07-19 DIAGNOSIS — G8918 Other acute postprocedural pain: Secondary | ICD-10-CM | POA: Diagnosis not present

## 2019-07-19 DIAGNOSIS — Z79899 Other long term (current) drug therapy: Secondary | ICD-10-CM | POA: Diagnosis not present

## 2019-07-19 HISTORY — DX: Gastro-esophageal reflux disease without esophagitis: K21.9

## 2019-07-19 HISTORY — DX: Presence of spectacles and contact lenses: Z97.3

## 2019-07-19 HISTORY — DX: Family history of other specified conditions: Z84.89

## 2019-07-19 HISTORY — DX: Other fracture of unspecified lower leg, initial encounter for closed fracture: S82.899A

## 2019-07-19 HISTORY — PX: ORIF ANKLE FRACTURE: SHX5408

## 2019-07-19 HISTORY — DX: Hypothyroidism, unspecified: E03.9

## 2019-07-19 LAB — GLUCOSE, CAPILLARY
Glucose-Capillary: 192 mg/dL — ABNORMAL HIGH (ref 70–99)
Glucose-Capillary: 143 mg/dL — ABNORMAL HIGH (ref 70–99)

## 2019-07-19 SURGERY — OPEN REDUCTION INTERNAL FIXATION (ORIF) ANKLE FRACTURE
Anesthesia: General | Site: Ankle | Laterality: Left

## 2019-07-19 MED ORDER — MIDAZOLAM HCL 2 MG/2ML IJ SOLN
INTRAMUSCULAR | Status: AC
  Filled 2019-07-19: qty 2

## 2019-07-19 MED ORDER — ASPIRIN EC 325 MG PO TBEC: 325.0000 mg | DELAYED_RELEASE_TABLET | Freq: Every day | ORAL | 0 refills | Status: AC

## 2019-07-19 MED ORDER — OXYCODONE HCL 5 MG PO TABS: 5.0000 mg | ORAL_TABLET | Freq: Once | ORAL | Status: DC | PRN

## 2019-07-19 MED ORDER — 0.9 % SODIUM CHLORIDE (POUR BTL) OPTIME
TOPICAL | Status: DC | PRN
  Administered 2019-07-19: 1000 mL

## 2019-07-19 MED ORDER — ROCURONIUM BROMIDE 100 MG/10ML IV SOLN
INTRAVENOUS | Status: DC | PRN
  Administered 2019-07-19: 50 mg via INTRAVENOUS

## 2019-07-19 MED ORDER — MIDAZOLAM HCL 5 MG/5ML IJ SOLN
INTRAMUSCULAR | Status: DC | PRN
  Administered 2019-07-19: 1 mg via INTRAVENOUS

## 2019-07-19 MED ORDER — DEXAMETHASONE SODIUM PHOSPHATE 10 MG/ML IJ SOLN
INTRAMUSCULAR | Status: AC
  Filled 2019-07-19: qty 1

## 2019-07-19 MED ORDER — PHENYLEPHRINE HCL-NACL 10-0.9 MG/250ML-% IV SOLN: INTRAVENOUS | Status: DC | PRN

## 2019-07-19 MED ORDER — BACITRACIN ZINC 500 UNIT/GM EX OINT
TOPICAL_OINTMENT | CUTANEOUS | Status: AC
  Filled 2019-07-19: qty 28.35

## 2019-07-19 MED ORDER — PHENYLEPHRINE HCL-NACL 10-0.9 MG/250ML-% IV SOLN
INTRAVENOUS | Status: DC | PRN
  Administered 2019-07-19: 50 ug/min via INTRAVENOUS

## 2019-07-19 MED ORDER — OXYCODONE HCL 5 MG/5ML PO SOLN: 5.0000 mg | Freq: Once | ORAL | Status: DC | PRN

## 2019-07-19 MED ORDER — FENTANYL CITRATE (PF) 100 MCG/2ML IJ SOLN: 25.0000 ug | INTRAMUSCULAR | Status: DC | PRN

## 2019-07-19 MED ORDER — VANCOMYCIN HCL 1000 MG IV SOLR
INTRAVENOUS | Status: DC | PRN
  Administered 2019-07-19: 1000 mg

## 2019-07-19 MED ORDER — PROPOFOL 10 MG/ML IV BOLUS
INTRAVENOUS | Status: DC | PRN
  Administered 2019-07-19: 120 mg via INTRAVENOUS

## 2019-07-19 MED ORDER — VANCOMYCIN HCL 1000 MG IV SOLR
INTRAVENOUS | Status: AC
  Filled 2019-07-19: qty 1000

## 2019-07-19 MED ORDER — ROPIVACAINE HCL 5 MG/ML IJ SOLN
INTRAMUSCULAR | Status: DC | PRN
  Administered 2019-07-19: 15 mL via PERINEURAL
  Administered 2019-07-19: 20 mL via PERINEURAL

## 2019-07-19 MED ORDER — PROPOFOL 10 MG/ML IV BOLUS
INTRAVENOUS | Status: AC
  Filled 2019-07-19: qty 60

## 2019-07-19 MED ORDER — LIDOCAINE 2% (20 MG/ML) 5 ML SYRINGE
INTRAMUSCULAR | Status: DC | PRN
  Administered 2019-07-19: 80 mg via INTRAVENOUS

## 2019-07-19 MED ORDER — DEXAMETHASONE SODIUM PHOSPHATE 10 MG/ML IJ SOLN
INTRAMUSCULAR | Status: DC | PRN
  Administered 2019-07-19: 10 mg

## 2019-07-19 MED ORDER — SUGAMMADEX SODIUM 200 MG/2ML IV SOLN
INTRAVENOUS | Status: DC | PRN
  Administered 2019-07-19: 200 mg via INTRAVENOUS

## 2019-07-19 MED ORDER — ONDANSETRON HCL 4 MG/2ML IJ SOLN: 4.0000 mg | Freq: Once | INTRAMUSCULAR | Status: DC | PRN

## 2019-07-19 MED ORDER — HYDROCODONE-ACETAMINOPHEN 5-325 MG PO TABS: 1.0000 | ORAL_TABLET | Freq: Four times a day (QID) | ORAL | 0 refills | Status: DC | PRN

## 2019-07-19 MED ORDER — ONDANSETRON HCL 4 MG/2ML IJ SOLN
INTRAMUSCULAR | Status: DC | PRN
  Administered 2019-07-19: 4 mg via INTRAVENOUS

## 2019-07-19 MED ORDER — FENTANYL CITRATE (PF) 250 MCG/5ML IJ SOLN
INTRAMUSCULAR | Status: DC | PRN
  Administered 2019-07-19 (×2): 50 ug via INTRAVENOUS

## 2019-07-19 MED ORDER — BUPIVACAINE HCL (PF) 0.5 % IJ SOLN
INTRAMUSCULAR | Status: AC
  Filled 2019-07-19: qty 30

## 2019-07-19 MED ORDER — ONDANSETRON HCL 4 MG/2ML IJ SOLN
INTRAMUSCULAR | Status: AC
  Filled 2019-07-19: qty 2

## 2019-07-19 MED ORDER — CEFAZOLIN SODIUM-DEXTROSE 2-4 GM/100ML-% IV SOLN
2.0000 g | INTRAVENOUS | Status: AC
  Administered 2019-07-19: 08:00:00 2 g via INTRAVENOUS

## 2019-07-19 MED ORDER — CEFAZOLIN SODIUM-DEXTROSE 2-4 GM/100ML-% IV SOLN
INTRAVENOUS | Status: AC
  Filled 2019-07-19: qty 100

## 2019-07-19 MED ORDER — DEXAMETHASONE SODIUM PHOSPHATE 10 MG/ML IJ SOLN
INTRAMUSCULAR | Status: DC | PRN
  Administered 2019-07-19: 4 mg

## 2019-07-19 MED ORDER — CHLORHEXIDINE GLUCONATE 4 % EX LIQD: 60.0000 mL | Freq: Once | CUTANEOUS | Status: DC

## 2019-07-19 MED ORDER — LACTATED RINGERS IV SOLN: INTRAVENOUS | Status: DC | PRN

## 2019-07-19 MED ORDER — LIDOCAINE 2% (20 MG/ML) 5 ML SYRINGE
INTRAMUSCULAR | Status: AC
  Filled 2019-07-19: qty 5

## 2019-07-19 MED ORDER — PHENYLEPHRINE 40 MCG/ML (10ML) SYRINGE FOR IV PUSH (FOR BLOOD PRESSURE SUPPORT)
PREFILLED_SYRINGE | INTRAVENOUS | Status: DC | PRN
  Administered 2019-07-19 (×3): 80 ug via INTRAVENOUS

## 2019-07-19 MED ORDER — FENTANYL CITRATE (PF) 250 MCG/5ML IJ SOLN
INTRAMUSCULAR | Status: AC
  Filled 2019-07-19: qty 5

## 2019-07-19 SURGICAL SUPPLY — 71 items
BANDAGE ESMARK 6X9 LF (GAUZE/BANDAGES/DRESSINGS) ×1 IMPLANT
BIT DRILL QC 2.0 SHORT EVOS SM (DRILL) ×1 IMPLANT
BIT DRILL QC 2.5MM SHRT EVO SM (DRILL) ×1 IMPLANT
BNDG COHESIVE 4X5 TAN STRL (GAUZE/BANDAGES/DRESSINGS) ×3 IMPLANT
BNDG ELASTIC 4X5.8 VLCR STR LF (GAUZE/BANDAGES/DRESSINGS) ×3 IMPLANT
BNDG ELASTIC 6X5.8 VLCR STR LF (GAUZE/BANDAGES/DRESSINGS) ×3 IMPLANT
BNDG ESMARK 6X9 LF (GAUZE/BANDAGES/DRESSINGS) ×3
BRUSH SCRUB EZ PLAIN DRY (MISCELLANEOUS) ×6 IMPLANT
CHLORAPREP W/TINT 26 (MISCELLANEOUS) ×3 IMPLANT
COVER SURGICAL LIGHT HANDLE (MISCELLANEOUS) ×3 IMPLANT
COVER WAND RF STERILE (DRAPES) ×3 IMPLANT
DRAPE C-ARM 42X72 X-RAY (DRAPES) ×3 IMPLANT
DRAPE C-ARMOR (DRAPES) ×3 IMPLANT
DRAPE ORTHO SPLIT 77X108 STRL (DRAPES) ×4
DRAPE SURG ORHT 6 SPLT 77X108 (DRAPES) ×2 IMPLANT
DRAPE U-SHAPE 47X51 STRL (DRAPES) ×3 IMPLANT
DRILL QC 2.0 SHORT EVOS SM (DRILL) ×3
DRILL QC 2.5MM SHORT EVOS SM (DRILL) ×3
DRSG ADAPTIC 3X8 NADH LF (GAUZE/BANDAGES/DRESSINGS) IMPLANT
DRSG MEPITEL 4X7.2 (GAUZE/BANDAGES/DRESSINGS) ×3 IMPLANT
ELECT REM PT RETURN 9FT ADLT (ELECTROSURGICAL) ×3
ELECTRODE REM PT RTRN 9FT ADLT (ELECTROSURGICAL) ×1 IMPLANT
GAUZE SPONGE 4X4 12PLY STRL (GAUZE/BANDAGES/DRESSINGS) ×3 IMPLANT
GLOVE BIO SURGEON STRL SZ 6.5 (GLOVE) ×6 IMPLANT
GLOVE BIO SURGEON STRL SZ7.5 (GLOVE) ×18 IMPLANT
GLOVE BIO SURGEONS STRL SZ 6.5 (GLOVE) ×3
GLOVE BIOGEL PI IND STRL 6.5 (GLOVE) ×2 IMPLANT
GLOVE BIOGEL PI IND STRL 7.5 (GLOVE) ×1 IMPLANT
GLOVE BIOGEL PI INDICATOR 6.5 (GLOVE) ×4
GLOVE BIOGEL PI INDICATOR 7.5 (GLOVE) ×2
GLOVE ECLIPSE 6.5 STRL STRAW (GLOVE) ×3 IMPLANT
GOWN STRL REUS W/ TWL LRG LVL3 (GOWN DISPOSABLE) ×3 IMPLANT
GOWN STRL REUS W/TWL LRG LVL3 (GOWN DISPOSABLE) ×6
K-WIRE 1.6 (WIRE) ×6
K-WIRE 2.0 (WIRE) ×2
K-WIRE FX150X1.6XTROC PNT (WIRE) ×3
K-WIRE TROCAR PT 2.0 150MM (WIRE) ×1
KIT TURNOVER KIT B (KITS) ×3 IMPLANT
KWIRE FX150X1.6XTROC PNT (WIRE) ×3 IMPLANT
KWIRE TROCAR PT 2.0 150MM (WIRE) ×1 IMPLANT
MANIFOLD NEPTUNE II (INSTRUMENTS) ×3 IMPLANT
NEEDLE HYPO 21X1.5 SAFETY (NEEDLE) IMPLANT
NEEDLE HYPO 25GX1X1/2 BEV (NEEDLE) ×3 IMPLANT
NS IRRIG 1000ML POUR BTL (IV SOLUTION) ×3 IMPLANT
PACK TOTAL JOINT (CUSTOM PROCEDURE TRAY) ×3 IMPLANT
PAD ARMBOARD 7.5X6 YLW CONV (MISCELLANEOUS) ×6 IMPLANT
PAD CAST 4YDX4 CTTN HI CHSV (CAST SUPPLIES) ×1 IMPLANT
PADDING CAST COTTON 4X4 STRL (CAST SUPPLIES) ×2
PADDING CAST COTTON 6X4 STRL (CAST SUPPLIES) ×3 IMPLANT
PLATE POST FIB 2.7X93 5H (Plate) ×3 IMPLANT
SCREW CORT 2.7X15 T8 ST EVOS (Screw) ×3 IMPLANT
SCREW CORT 2.7X19 ST STAR EVOS (Screw) ×3 IMPLANT
SCREW CORT EVOS ST 3.5X12 (Screw) ×9 IMPLANT
SCREW CORT ST EVOS 2.7X75 (Screw) ×3 IMPLANT
SCREW CTX 3.5X50MM EVOS (Screw) ×3 IMPLANT
SCREW EVOS 2.7X18 LOCK T8 (Screw) ×6 IMPLANT
SPONGE LAP 18X18 RF (DISPOSABLE) IMPLANT
STAPLER VISISTAT 35W (STAPLE) ×3 IMPLANT
SUCTION FRAZIER HANDLE 10FR (MISCELLANEOUS) ×2
SUCTION TUBE FRAZIER 10FR DISP (MISCELLANEOUS) ×1 IMPLANT
SUT ETHILON 3 0 PS 1 (SUTURE) ×6 IMPLANT
SUT PROLENE 0 CT (SUTURE) IMPLANT
SUT VIC AB 0 CT1 27 (SUTURE) ×2
SUT VIC AB 0 CT1 27XBRD ANBCTR (SUTURE) ×1 IMPLANT
SUT VIC AB 2-0 CT1 27 (SUTURE) ×4
SUT VIC AB 2-0 CT1 TAPERPNT 27 (SUTURE) ×2 IMPLANT
SYR CONTROL 10ML LL (SYRINGE) ×3 IMPLANT
TOWEL GREEN STERILE (TOWEL DISPOSABLE) ×6 IMPLANT
TOWEL GREEN STERILE FF (TOWEL DISPOSABLE) ×3 IMPLANT
UNDERPAD 30X30 (UNDERPADS AND DIAPERS) ×3 IMPLANT
WATER STERILE IRR 1000ML POUR (IV SOLUTION) ×3 IMPLANT

## 2019-07-19 NOTE — Op Note (Signed)
Orthopaedic Surgery Operative Note (CSN: [id number] ) Date of Surgery: 07/19/2019  Admit Date: 07/19/2019   Diagnoses: Pre-Op Diagnoses: Left trimalleolar ankle fracture  Post-Op Diagnosis: Same  Procedures: 1. CPT [id number] reduction internal fixation of left trimalleolar ankle fracture 2. CPT 27829-Open reduction internal fixation of left syndesmosis  Surgeons : Primary: Ellis Koffler, Thomasene Lot, MD  Assistant: Patrecia Pace, PA-C  Location: OR 2   Anesthesia:General with regional anesthesia  Antibiotics: Ancef 2g preop, 1 gram of vancomycin powder topically  Tourniquet time:None used  Estimated Blood Loss:None  Complications:None  Specimens:None   Implants: Implant Name Type Inv. Item Serial No. Manufacturer Lot No. LRB No. Used Action  PLATE POST FIB [id number] - [id number] Plate PLATE POST FIB [id number] 5H  [employer]  Left 1 Implanted  SCREW CORT EVOS ST 3.5X12 - [id number] Screw SCREW CORT EVOS ST 3.5X12  [employer]  Left 3 Implanted  SCREW LOCK ST EVOS 2.7X19 [id number] Screw SCREW LOCK ST EVOS 2.7X19  [employer]  Left 1 Implanted  SCREW EVOS 2.7X18 LOCK T8 - [id number] Screw SCREW EVOS 2.7X18 LOCK T8  [employer]  Left 2 Implanted  SCREW CORT EVOS ST T8 2.7X15 - [id number] Screw SCREW CORT EVOS ST T8 2.7X15  [employer]  Left 1 Implanted  SCREW CORT ST EVOS 2.7X75 [id number] Screw SCREW CORT ST EVOS 2.7X75  [employer]  Left 1 Implanted  SCREW CTX 3.5X50MM EVOS [id number] Screw SCREW CTX 3.5X50MM EVOS  [employer]  Left 1 Implanted     Indications for Surgery: 69 year old female with a history of type 1 diabetes that sustained a fall and had a left ankle fracture dislocation.  She was reduced in the emergency room.  Due to the unstable nature of her injury I recommended proceeding with open reduction internal fixation.  Risks and benefits were discussed with  the patient.  Risks included but not limited to bleeding, infection, malunion, nonunion, posttraumatic arthritis, ankle stiffness, nerve and blood vessel injury, DVT, need for hardware removal, even the possibility anesthetic complications.  The patient agreed to proceed with surgery and consent was obtained.  Operative Findings: 1.  Open reduction internal fixation of left trimalleolar ankle fracture using [employer] EVOS posterior lateral plate with independent 2.7 millimeters screw for the medial malleolus 2.  External rotation stress view with medial clear space widening fixed with a quadricortical 3.25mm screw across the syndesmosis  Procedure: The patient was identified in the preoperative holding area. Consent was confirmed with the patient and their family and all questions were answered. The operative extremity was marked after confirmation with the patient. she was then brought back to the operating room by our anesthesia colleagues.  She was carefully transferred over to a radiolucent flat top table.  She was placed under general anesthetic.  The left lower extremity was then prepped and draped in usual sterile fashion.  A timeout was performed to verify the patient, the procedure, and the extremity.  Preoperative antibiotics were dosed.  Fluoroscopic imaging was obtained to show the unstable nature of her injury.  A direct lateral approach to the fibula was obtained.  Was carried down through skin and subcutaneous tissue.  The superficial peroneal nerve was dissected out and protected through the case.  The fracture was visualized periosteum was removed and hematoma was removed as well.  The peroneals were released from the posterior aspect of the distal  segment and a clamp was used to reduce the fracture anatomically.  Fluoroscopic imaging was used to confirm adequate reduction and then it was held provisionally with a 1.6 mm K wires.  The clamp was removed and then I used a [employer]  EVOS posterior lateral fibular locking plate and held it provisionally with a K wire.  I placed a nonlocking screw into the fibular shaft.  I then proceeded to place 4 locking screws into the distal fibular segment.  2 more nonlocking screws were placed into the fibular shaft.  I then turned my attention to the medial malleolus.  There is a small avulsion of the anterior colliculus of the malleolus.  An attempt was made for close reduction with percutaneous fixation due to a soft tissue wound from her initial dislocation.  However the reduction was not anatomic and I made a small incision to visualize the reduction I held the medial malleolus with a dental pick and then proceeded to drill and place a bicortical 2.7 mm [employer] Nephew screw.  Once both medial and lateral malleolus were fixed then performed a external rotation stress view with the ankle in full dorsiflexion.  There was some clear medial clear space widening and I felt that syndesmotic fixation would be appropriate.  With the ankle in full dorsiflexion I provided a slight medial force to the fibula and then drilled through the fibula and tibia and placed a 3.5 millimeter screw across both bones.  A repeat external rotation stress view was performed which showed no medial clear space widening.  Final fluoroscopic imaging was obtained.  The incision was copiously irrigated.  A gram of vancomycin powder was placed between the 2 incisions.  A layer closure was performed with 2-0 Vicryl and 3-0 nylon.  Sterile dressing consisting of Mepitel, 4 x 4's, sterile cast padding and a well-padded short leg splint was placed.  The patient was awoken from anesthesia and taken the PACU in stable condition.  Post Op Plan/Instructions: The patient will be nonweightbearing to the left lower extremity.  She will receive aspirin 325 mg for DVT prophylaxis.  We will have her follow-up in 2 weeks for x-rays and suture removal.  She will be discharged home from the  PACU.  I was present and performed the entire surgery.  Patrecia Pace, PA-C did assist me throughout the case. An assistant was necessary given the difficulty in approach, maintenance of reduction and ability to instrument the fracture.   Katha Hamming, MD [facility]

## 2019-07-19 NOTE — Discharge Instructions (Signed)
Orthopaedic Trauma Service Discharge Instructions   General Discharge Instructions  WEIGHT BEARING STATUS: Nonweightbearing left lower extremity  RANGE OF MOTION/ACTIVITY: Okay for knee range of motion.  Wound Care: Keep splint in place until follow-up appointment.  Okay to shower but do not get splint wet.    DVT/PE prophylaxis: Aspirin 325 mg daily  Diet: as you were eating previously.  Can use over the counter stool softeners and bowel preparations, such as Miralax, to help with bowel movements.  Narcotics can be constipating.  Be sure to drink plenty of fluids  PAIN MEDICATION USE AND EXPECTATIONS  You have likely been given narcotic medications to help control your pain.  After a traumatic event that results in an fracture (broken bone) with or without surgery, it is ok to use narcotic pain medications to help control one's pain.  We understand that everyone responds to pain differently and each individual patient will be evaluated on a regular basis for the continued need for narcotic medications. Ideally, narcotic medication use should last no more than 6-8 weeks (coinciding with fracture healing).   As a patient it is your responsibility as well to monitor narcotic medication use and report the amount and frequency you use these medications when you come to your office visit.   We would also advise that if you are using narcotic medications, you should take a dose prior to therapy to maximize you participation.  IF YOU ARE ON NARCOTIC MEDICATIONS IT IS NOT PERMISSIBLE TO OPERATE A MOTOR VEHICLE (MOTORCYCLE/CAR/TRUCK/MOPED) OR HEAVY MACHINERY DO NOT MIX NARCOTICS WITH OTHER CNS (CENTRAL NERVOUS SYSTEM) DEPRESSANTS SUCH AS ALCOHOL   STOP SMOKING OR USING NICOTINE PRODUCTS!!!!  As discussed nicotine severely impairs your body's ability to heal surgical and traumatic wounds but also impairs bone healing.  Wounds and bone heal by forming microscopic blood vessels (angiogenesis) and  nicotine is a vasoconstrictor (essentially, shrinks blood vessels).  Therefore, if vasoconstriction occurs to these microscopic blood vessels they essentially disappear and are unable to deliver necessary nutrients to the healing tissue.  This is one modifiable factor that you can do to dramatically increase your chances of healing your injury.    (This means no smoking, no nicotine gum, patches, etc)  DO NOT USE NONSTEROIDAL ANTI-INFLAMMATORY DRUGS (NSAID'S)  Using products such as Advil (ibuprofen), Aleve (naproxen), Motrin (ibuprofen) for additional pain control during fracture healing can delay and/or prevent the healing response.  If you would like to take over the counter (OTC) medication, Tylenol (acetaminophen) is ok.  However, some narcotic medications that are given for pain control contain acetaminophen as well. Therefore, you should not exceed more than 4000 mg of tylenol in a day if you do not have liver disease.  Also note that there are may OTC medicines, such as cold medicines and allergy medicines that my contain tylenol as well.  If you have any questions about medications and/or interactions please ask your doctor/PA or your pharmacist.      ICE AND ELEVATE INJURED/OPERATIVE EXTREMITY  Using ice and elevating the injured extremity above your heart can help with swelling and pain control.  Icing in a pulsatile fashion, such as 20 minutes on and 20 minutes off, can be followed.    Do not place ice directly on skin. Make sure there is a barrier between to skin and the ice pack.    Using frozen items such as frozen peas works well as the conform nicely to the are that needs to be iced.  USE AN ACE WRAP OR TED HOSE FOR SWELLING CONTROL  In addition to icing and elevation, Ace wraps or TED hose are used to help limit and resolve swelling.  It is recommended to use Ace wraps or TED hose until you are informed to stop.    When using Ace Wraps start the wrapping distally (farthest away from  the body) and wrap proximally (closer to the body)   Example: If you had surgery on your leg or thing and you do not have a splint on, start the ace wrap at the toes and work your way up to the thigh        If you had surgery on your upper extremity and do not have a splint on, start the ace wrap at your fingers and work your way up to the upper arm  IF YOU ARE IN A SPLINT OR CAST DO NOT [place]   If your splint gets wet for any reason please contact the office immediately. You may shower in your splint or cast as long as you keep it dry.  This can be done by wrapping in a cast cover or garbage back (or similar)  Do Not stick any thing down your splint or cast such as pencils, money, or hangers to try and scratch yourself with.  If you feel itchy take benadryl as prescribed on the bottle for itching  IF YOU ARE IN A CAM BOOT (BLACK BOOT)  You may remove boot periodically. Perform daily dressing changes as noted below.  Wash the liner of the boot regularly and wear a sock when wearing the boot. It is recommended that you sleep in the boot until told otherwise   CALL THE OFFICE WITH ANY QUESTIONS OR CONCERNS: [phone]   VISIT OUR WEBSITE FOR ADDITIONAL INFORMATION: [email or web address]     Discharge Wound Care Instructions  Do NOT apply any ointments, solutions or lotions to pin sites or surgical wounds.  These prevent needed drainage and even though solutions like hydrogen peroxide kill bacteria, they also damage cells lining the pin sites that help fight infection.  Applying lotions or ointments can keep the wounds moist and can cause them to breakdown and open up as well. This can increase the risk for infection. When in doubt call the office.  Surgical incisions should be dressed daily.  If any drainage is noted, use one layer of adaptic, then gauze, Kerlix, and an ace wrap.  Once the incision is completely dry and without drainage, it may be left open to air out.   Showering may begin 36-48 hours later.  Cleaning gently with soap and water.  Traumatic wounds should be dressed daily as well.    One layer of adaptic, gauze, Kerlix, then ace wrap.  The adaptic can be discontinued once the draining has ceased    If you have a wet to dry dressing: wet the gauze with saline the squeeze as much saline out so the gauze is moist (not soaking wet), place moistened gauze over wound, then place a dry gauze over the moist one, followed by Kerlix wrap, then ace wrap.

## 2019-07-19 NOTE — Anesthesia Procedure Notes (Signed)
Anesthesia Regional Block: Adductor canal block   Pre-Anesthetic Checklist: ,, timeout performed, Correct Patient, Correct Site, Correct Laterality, Correct Procedure, Correct Position, site marked, Risks and benefits discussed,  Surgical consent,  Pre-op evaluation,  At surgeon's request and post-op pain management  Laterality: Left  Prep: chloraprep       Needles:  Injection technique: Single-shot  Needle Type: Echogenic Stimulator Needle     Needle Length: 10cm  Needle Gauge: 21     Additional Needles:   Procedures:,,,, ultrasound used (permanent image in chart),,,,  Narrative:  Start time: 07/19/2019 7:25 AM End time: 07/19/2019 7:28 AM Injection made incrementally with aspirations every 5 mL.  Performed by: Personally  Anesthesiologist: Lidia Collum, MD  Additional Notes: Monitors applied. Injection made in 5cc increments. No resistance to injection. Good needle visualization. Patient tolerated procedure well.

## 2019-07-19 NOTE — Interval H&P Note (Signed)
History and Physical Interval Note:  07/19/2019 7:21 AM  Jamie Mays  has presented today for surgery, with the diagnosis of Left trimalleolar ankle fracture.  The various methods of treatment have been discussed with the patient and family. After consideration of risks, benefits and other options for treatment, the patient has consented to  Procedure(s): OPEN REDUCTION INTERNAL FIXATION (ORIF) ANKLE FRACTURE (Left) as a surgical intervention.  The patient's history has been reviewed, patient examined, no change in status, stable for surgery.  I have reviewed the patient's chart and labs.  Questions were answered to the patient's satisfaction.     Lennette Bihari P Sanam Marmo

## 2019-07-19 NOTE — Anesthesia Procedure Notes (Signed)
Anesthesia Regional Block: Popliteal block   Pre-Anesthetic Checklist: ,, timeout performed, Correct Patient, Correct Site, Correct Laterality, Correct Procedure, Correct Position, site marked, Risks and benefits discussed,  Surgical consent,  Pre-op evaluation,  At surgeon's request and post-op pain management  Laterality: Left  Prep: chloraprep       Needles:  Injection technique: Single-shot  Needle Type: Echogenic Stimulator Needle     Needle Length: 10cm  Needle Gauge: 21     Additional Needles:   Procedures:,,,, ultrasound used (permanent image in chart),,,,  Narrative:  Start time: 07/19/2019 7:28 AM End time: 07/19/2019 7:32 AM Injection made incrementally with aspirations every 5 mL.  Performed by: Personally  Anesthesiologist: Lidia Collum, MD  Additional Notes: Monitors applied. Injection made in 5cc increments. No resistance to injection. Good needle visualization. Patient tolerated procedure well.

## 2019-07-19 NOTE — Transfer of Care (Signed)
Immediate Anesthesia Transfer of Care Note  Patient: Jamie Mays  Procedure(s) Performed: OPEN REDUCTION INTERNAL FIXATION (ORIF) ANKLE FRACTURE (Left Ankle)  Patient Location: PACU  Anesthesia Type:General  Level of Consciousness: drowsy  Airway & Oxygen Therapy: Patient Spontanous Breathing and Patient connected to face mask oxygen  Post-op Assessment: Report given to RN and Post -op Vital signs reviewed and stable  Post vital signs: Reviewed and stable  Last Vitals:  Vitals Value Taken Time  BP 132/50 07/19/19 0915  Temp    Pulse 85 07/19/19 0918  Resp 20 07/19/19 0918  SpO2 94 % 07/19/19 0918  Vitals shown include unvalidated device data.  Last Pain:  Vitals:   07/19/19 0650  TempSrc:   PainSc: 0-No pain      Patients Stated Pain Goal: 5 ([id number])  Complications: No apparent anesthesia complications

## 2019-07-19 NOTE — Progress Notes (Signed)
Orthopedic Tech Progress Note Patient Details:  Jamie Mays 27-Apr-1951 [id number] PACU RN called requesting a pair of CRUTCHES for the patient Ortho Devices Type of Ortho Device: Crutches Ortho Device/Splint Interventions: Application, Ordered   Post Interventions Patient Tolerated: Ambulated well, Well Instructions Provided: Poper ambulation with device, Adjustment of device, Care of device   Janit Pagan 07/19/2019, 10:05 AM

## 2019-07-19 NOTE — Anesthesia Postprocedure Evaluation (Signed)
Anesthesia Post Note  Patient: Jamie Mays  Procedure(s) Performed: OPEN REDUCTION INTERNAL FIXATION (ORIF) ANKLE FRACTURE (Left Ankle)     Patient location during evaluation: PACU Anesthesia Type: General Level of consciousness: awake and alert Pain management: pain level controlled Vital Signs Assessment: post-procedure vital signs reviewed and stable Respiratory status: spontaneous breathing, nonlabored ventilation and respiratory function stable Cardiovascular status: blood pressure returned to baseline and stable Postop Assessment: no apparent nausea or vomiting Anesthetic complications: no    Last Vitals:  Vitals:   07/19/19 0930 07/19/19 0945  BP: (!) 122/58 (!) 119/59  Pulse: 81 82  Resp: 15 18  Temp:  (!) 36.3 C  SpO2: 97% 95%    Last Pain:  Vitals:   07/19/19 0915  TempSrc:   PainSc: 0-No pain    LLE Motor Response: No movement due to regional block (07/19/19 0945) LLE Sensation: Decreased (07/19/19 0945)          Lidia Collum

## 2019-07-19 NOTE — Anesthesia Procedure Notes (Addendum)
Procedure Name: Intubation Date/Time: 07/19/2019 7:48 AM Performed by: Janene Harvey, CRNA Pre-anesthesia Checklist: Patient identified, Emergency Drugs available, Suction available and Patient being monitored Patient Re-evaluated:Patient Re-evaluated prior to induction Oxygen Delivery Method: Circle system utilized Preoxygenation: Pre-oxygenation with 100% oxygen Induction Type: IV induction Ventilation: Mask ventilation without difficulty and Oral airway inserted - appropriate to patient size Laryngoscope Size: Mac and 3 Grade View: Grade I Tube type: Oral Tube size: 7.0 mm Number of attempts: 1 Airway Equipment and Method: Stylet and Oral airway Placement Confirmation: ETT inserted through vocal cords under direct vision,  positive ETCO2 and breath sounds checked- equal and bilateral Secured at: 21 cm Tube secured with: Tape Dental Injury: Teeth and Oropharynx as per pre-operative assessment  Comments: Srna ashley jarrell intubated under crna supervision

## 2019-07-20 ENCOUNTER — Encounter: Payer: Self-pay | Admitting: *Deleted

## 2019-07-22 DIAGNOSIS — S93432A Sprain of tibiofibular ligament of left ankle, initial encounter: Secondary | ICD-10-CM

## 2019-07-31 DIAGNOSIS — S8292XD Unspecified fracture of left lower leg, subsequent encounter for closed fracture with routine healing: Secondary | ICD-10-CM | POA: Diagnosis not present

## 2019-08-21 DIAGNOSIS — S82852D Displaced trimalleolar fracture of left lower leg, subsequent encounter for closed fracture with routine healing: Secondary | ICD-10-CM | POA: Diagnosis not present

## 2019-08-21 DIAGNOSIS — S93432D Sprain of tibiofibular ligament of left ankle, subsequent encounter: Secondary | ICD-10-CM | POA: Diagnosis not present

## 2019-08-29 DIAGNOSIS — R262 Difficulty in walking, not elsewhere classified: Secondary | ICD-10-CM | POA: Diagnosis not present

## 2019-08-29 DIAGNOSIS — M25572 Pain in left ankle and joints of left foot: Secondary | ICD-10-CM | POA: Diagnosis not present

## 2019-08-29 DIAGNOSIS — M6281 Muscle weakness (generalized): Secondary | ICD-10-CM | POA: Diagnosis not present

## 2019-08-29 DIAGNOSIS — M25672 Stiffness of left ankle, not elsewhere classified: Secondary | ICD-10-CM | POA: Diagnosis not present

## 2019-09-04 DIAGNOSIS — M25672 Stiffness of left ankle, not elsewhere classified: Secondary | ICD-10-CM | POA: Diagnosis not present

## 2019-09-04 DIAGNOSIS — M25572 Pain in left ankle and joints of left foot: Secondary | ICD-10-CM | POA: Diagnosis not present

## 2019-09-04 DIAGNOSIS — M6281 Muscle weakness (generalized): Secondary | ICD-10-CM | POA: Diagnosis not present

## 2019-09-04 DIAGNOSIS — R262 Difficulty in walking, not elsewhere classified: Secondary | ICD-10-CM | POA: Diagnosis not present

## 2019-09-07 DIAGNOSIS — M25672 Stiffness of left ankle, not elsewhere classified: Secondary | ICD-10-CM | POA: Diagnosis not present

## 2019-09-07 DIAGNOSIS — M25572 Pain in left ankle and joints of left foot: Secondary | ICD-10-CM | POA: Diagnosis not present

## 2019-09-07 DIAGNOSIS — M6281 Muscle weakness (generalized): Secondary | ICD-10-CM | POA: Diagnosis not present

## 2019-09-07 DIAGNOSIS — R262 Difficulty in walking, not elsewhere classified: Secondary | ICD-10-CM | POA: Diagnosis not present

## 2019-09-10 ENCOUNTER — Other Ambulatory Visit: Payer: Self-pay | Admitting: Cardiovascular Disease

## 2019-09-11 DIAGNOSIS — S82852D Displaced trimalleolar fracture of left lower leg, subsequent encounter for closed fracture with routine healing: Secondary | ICD-10-CM | POA: Diagnosis not present

## 2019-09-11 DIAGNOSIS — M6281 Muscle weakness (generalized): Secondary | ICD-10-CM | POA: Diagnosis not present

## 2019-09-11 DIAGNOSIS — M25572 Pain in left ankle and joints of left foot: Secondary | ICD-10-CM | POA: Diagnosis not present

## 2019-09-11 DIAGNOSIS — S93432D Sprain of tibiofibular ligament of left ankle, subsequent encounter: Secondary | ICD-10-CM | POA: Diagnosis not present

## 2019-09-11 DIAGNOSIS — M25672 Stiffness of left ankle, not elsewhere classified: Secondary | ICD-10-CM | POA: Diagnosis not present

## 2019-09-11 DIAGNOSIS — R262 Difficulty in walking, not elsewhere classified: Secondary | ICD-10-CM | POA: Diagnosis not present

## 2019-09-13 DIAGNOSIS — M6281 Muscle weakness (generalized): Secondary | ICD-10-CM | POA: Diagnosis not present

## 2019-09-13 DIAGNOSIS — M25672 Stiffness of left ankle, not elsewhere classified: Secondary | ICD-10-CM | POA: Diagnosis not present

## 2019-09-13 DIAGNOSIS — R262 Difficulty in walking, not elsewhere classified: Secondary | ICD-10-CM | POA: Diagnosis not present

## 2019-09-13 DIAGNOSIS — M25572 Pain in left ankle and joints of left foot: Secondary | ICD-10-CM | POA: Diagnosis not present

## 2019-09-18 DIAGNOSIS — M25572 Pain in left ankle and joints of left foot: Secondary | ICD-10-CM | POA: Diagnosis not present

## 2019-09-18 DIAGNOSIS — M25672 Stiffness of left ankle, not elsewhere classified: Secondary | ICD-10-CM | POA: Diagnosis not present

## 2019-09-18 DIAGNOSIS — M6281 Muscle weakness (generalized): Secondary | ICD-10-CM | POA: Diagnosis not present

## 2019-09-18 DIAGNOSIS — R262 Difficulty in walking, not elsewhere classified: Secondary | ICD-10-CM | POA: Diagnosis not present

## 2019-09-21 DIAGNOSIS — R262 Difficulty in walking, not elsewhere classified: Secondary | ICD-10-CM | POA: Diagnosis not present

## 2019-09-21 DIAGNOSIS — M25672 Stiffness of left ankle, not elsewhere classified: Secondary | ICD-10-CM | POA: Diagnosis not present

## 2019-09-21 DIAGNOSIS — M25572 Pain in left ankle and joints of left foot: Secondary | ICD-10-CM | POA: Diagnosis not present

## 2019-09-21 DIAGNOSIS — M6281 Muscle weakness (generalized): Secondary | ICD-10-CM | POA: Diagnosis not present

## 2019-09-25 DIAGNOSIS — S82852D Displaced trimalleolar fracture of left lower leg, subsequent encounter for closed fracture with routine healing: Secondary | ICD-10-CM | POA: Diagnosis not present

## 2019-09-25 DIAGNOSIS — M6281 Muscle weakness (generalized): Secondary | ICD-10-CM | POA: Diagnosis not present

## 2019-09-25 DIAGNOSIS — M25672 Stiffness of left ankle, not elsewhere classified: Secondary | ICD-10-CM | POA: Diagnosis not present

## 2019-09-25 DIAGNOSIS — S93432D Sprain of tibiofibular ligament of left ankle, subsequent encounter: Secondary | ICD-10-CM | POA: Diagnosis not present

## 2019-09-25 DIAGNOSIS — M25572 Pain in left ankle and joints of left foot: Secondary | ICD-10-CM | POA: Diagnosis not present

## 2019-09-25 DIAGNOSIS — R262 Difficulty in walking, not elsewhere classified: Secondary | ICD-10-CM | POA: Diagnosis not present

## 2019-09-28 DIAGNOSIS — R262 Difficulty in walking, not elsewhere classified: Secondary | ICD-10-CM | POA: Diagnosis not present

## 2019-09-28 DIAGNOSIS — M6281 Muscle weakness (generalized): Secondary | ICD-10-CM | POA: Diagnosis not present

## 2019-09-28 DIAGNOSIS — M25572 Pain in left ankle and joints of left foot: Secondary | ICD-10-CM | POA: Diagnosis not present

## 2019-09-28 DIAGNOSIS — M25672 Stiffness of left ankle, not elsewhere classified: Secondary | ICD-10-CM | POA: Diagnosis not present

## 2019-10-02 DIAGNOSIS — M6281 Muscle weakness (generalized): Secondary | ICD-10-CM | POA: Diagnosis not present

## 2019-10-02 DIAGNOSIS — M25572 Pain in left ankle and joints of left foot: Secondary | ICD-10-CM | POA: Diagnosis not present

## 2019-10-02 DIAGNOSIS — M25672 Stiffness of left ankle, not elsewhere classified: Secondary | ICD-10-CM | POA: Diagnosis not present

## 2019-10-02 DIAGNOSIS — R262 Difficulty in walking, not elsewhere classified: Secondary | ICD-10-CM | POA: Diagnosis not present

## 2019-10-04 DIAGNOSIS — E559 Vitamin D deficiency, unspecified: Secondary | ICD-10-CM | POA: Diagnosis not present

## 2019-10-04 DIAGNOSIS — I10 Essential (primary) hypertension: Secondary | ICD-10-CM | POA: Diagnosis not present

## 2019-10-04 DIAGNOSIS — E7849 Other hyperlipidemia: Secondary | ICD-10-CM | POA: Diagnosis not present

## 2019-10-04 DIAGNOSIS — E038 Other specified hypothyroidism: Secondary | ICD-10-CM | POA: Diagnosis not present

## 2019-10-04 DIAGNOSIS — E049 Nontoxic goiter, unspecified: Secondary | ICD-10-CM | POA: Diagnosis not present

## 2019-10-04 DIAGNOSIS — K760 Fatty (change of) liver, not elsewhere classified: Secondary | ICD-10-CM | POA: Diagnosis not present

## 2019-10-04 DIAGNOSIS — E10319 Type 1 diabetes mellitus with unspecified diabetic retinopathy without macular edema: Secondary | ICD-10-CM | POA: Diagnosis not present

## 2019-10-04 DIAGNOSIS — I493 Ventricular premature depolarization: Secondary | ICD-10-CM | POA: Diagnosis not present

## 2019-10-04 DIAGNOSIS — E083393 Diabetes mellitus due to underlying condition with moderate nonproliferative diabetic retinopathy without macular edema, bilateral: Secondary | ICD-10-CM | POA: Diagnosis not present

## 2019-10-05 DIAGNOSIS — M25672 Stiffness of left ankle, not elsewhere classified: Secondary | ICD-10-CM | POA: Diagnosis not present

## 2019-10-05 DIAGNOSIS — M6281 Muscle weakness (generalized): Secondary | ICD-10-CM | POA: Diagnosis not present

## 2019-10-05 DIAGNOSIS — R262 Difficulty in walking, not elsewhere classified: Secondary | ICD-10-CM | POA: Diagnosis not present

## 2019-10-05 DIAGNOSIS — M25572 Pain in left ankle and joints of left foot: Secondary | ICD-10-CM | POA: Diagnosis not present

## 2019-10-09 DIAGNOSIS — M25672 Stiffness of left ankle, not elsewhere classified: Secondary | ICD-10-CM | POA: Diagnosis not present

## 2019-10-09 DIAGNOSIS — M6281 Muscle weakness (generalized): Secondary | ICD-10-CM | POA: Diagnosis not present

## 2019-10-09 DIAGNOSIS — M25572 Pain in left ankle and joints of left foot: Secondary | ICD-10-CM | POA: Diagnosis not present

## 2019-10-09 DIAGNOSIS — R262 Difficulty in walking, not elsewhere classified: Secondary | ICD-10-CM | POA: Diagnosis not present

## 2019-10-12 DIAGNOSIS — M6281 Muscle weakness (generalized): Secondary | ICD-10-CM | POA: Diagnosis not present

## 2019-10-12 DIAGNOSIS — R262 Difficulty in walking, not elsewhere classified: Secondary | ICD-10-CM | POA: Diagnosis not present

## 2019-10-12 DIAGNOSIS — M25572 Pain in left ankle and joints of left foot: Secondary | ICD-10-CM | POA: Diagnosis not present

## 2019-10-12 DIAGNOSIS — M25672 Stiffness of left ankle, not elsewhere classified: Secondary | ICD-10-CM | POA: Diagnosis not present

## 2019-10-16 DIAGNOSIS — M6281 Muscle weakness (generalized): Secondary | ICD-10-CM | POA: Diagnosis not present

## 2019-10-16 DIAGNOSIS — M25672 Stiffness of left ankle, not elsewhere classified: Secondary | ICD-10-CM | POA: Diagnosis not present

## 2019-10-16 DIAGNOSIS — M25572 Pain in left ankle and joints of left foot: Secondary | ICD-10-CM | POA: Diagnosis not present

## 2019-10-16 DIAGNOSIS — R262 Difficulty in walking, not elsewhere classified: Secondary | ICD-10-CM | POA: Diagnosis not present

## 2019-10-23 DIAGNOSIS — R262 Difficulty in walking, not elsewhere classified: Secondary | ICD-10-CM | POA: Diagnosis not present

## 2019-10-23 DIAGNOSIS — H35013 Changes in retinal vascular appearance, bilateral: Secondary | ICD-10-CM | POA: Diagnosis not present

## 2019-10-23 DIAGNOSIS — H40013 Open angle with borderline findings, low risk, bilateral: Secondary | ICD-10-CM | POA: Diagnosis not present

## 2019-10-23 DIAGNOSIS — H35373 Puckering of macula, bilateral: Secondary | ICD-10-CM | POA: Diagnosis not present

## 2019-10-23 DIAGNOSIS — E103293 Type 1 diabetes mellitus with mild nonproliferative diabetic retinopathy without macular edema, bilateral: Secondary | ICD-10-CM | POA: Diagnosis not present

## 2019-10-23 DIAGNOSIS — H43813 Vitreous degeneration, bilateral: Secondary | ICD-10-CM | POA: Diagnosis not present

## 2019-10-23 DIAGNOSIS — M6281 Muscle weakness (generalized): Secondary | ICD-10-CM | POA: Diagnosis not present

## 2019-10-23 DIAGNOSIS — M25572 Pain in left ankle and joints of left foot: Secondary | ICD-10-CM | POA: Diagnosis not present

## 2019-10-23 DIAGNOSIS — M25672 Stiffness of left ankle, not elsewhere classified: Secondary | ICD-10-CM | POA: Diagnosis not present

## 2019-10-26 DIAGNOSIS — M6281 Muscle weakness (generalized): Secondary | ICD-10-CM | POA: Diagnosis not present

## 2019-10-26 DIAGNOSIS — R262 Difficulty in walking, not elsewhere classified: Secondary | ICD-10-CM | POA: Diagnosis not present

## 2019-10-26 DIAGNOSIS — M25572 Pain in left ankle and joints of left foot: Secondary | ICD-10-CM | POA: Diagnosis not present

## 2019-10-26 DIAGNOSIS — M25672 Stiffness of left ankle, not elsewhere classified: Secondary | ICD-10-CM | POA: Diagnosis not present

## 2019-10-30 DIAGNOSIS — M6281 Muscle weakness (generalized): Secondary | ICD-10-CM | POA: Diagnosis not present

## 2019-10-30 DIAGNOSIS — M25672 Stiffness of left ankle, not elsewhere classified: Secondary | ICD-10-CM | POA: Diagnosis not present

## 2019-10-30 DIAGNOSIS — R262 Difficulty in walking, not elsewhere classified: Secondary | ICD-10-CM | POA: Diagnosis not present

## 2019-10-30 DIAGNOSIS — M25572 Pain in left ankle and joints of left foot: Secondary | ICD-10-CM | POA: Diagnosis not present

## 2019-11-02 DIAGNOSIS — R262 Difficulty in walking, not elsewhere classified: Secondary | ICD-10-CM | POA: Diagnosis not present

## 2019-11-02 DIAGNOSIS — M25672 Stiffness of left ankle, not elsewhere classified: Secondary | ICD-10-CM | POA: Diagnosis not present

## 2019-11-02 DIAGNOSIS — M25572 Pain in left ankle and joints of left foot: Secondary | ICD-10-CM | POA: Diagnosis not present

## 2019-11-02 DIAGNOSIS — M6281 Muscle weakness (generalized): Secondary | ICD-10-CM | POA: Diagnosis not present

## 2019-11-06 DIAGNOSIS — R262 Difficulty in walking, not elsewhere classified: Secondary | ICD-10-CM | POA: Diagnosis not present

## 2019-11-06 DIAGNOSIS — M25672 Stiffness of left ankle, not elsewhere classified: Secondary | ICD-10-CM | POA: Diagnosis not present

## 2019-11-06 DIAGNOSIS — M6281 Muscle weakness (generalized): Secondary | ICD-10-CM | POA: Diagnosis not present

## 2019-11-06 DIAGNOSIS — M25572 Pain in left ankle and joints of left foot: Secondary | ICD-10-CM | POA: Diagnosis not present

## 2019-11-09 DIAGNOSIS — M25672 Stiffness of left ankle, not elsewhere classified: Secondary | ICD-10-CM | POA: Diagnosis not present

## 2019-11-09 DIAGNOSIS — M25572 Pain in left ankle and joints of left foot: Secondary | ICD-10-CM | POA: Diagnosis not present

## 2019-11-09 DIAGNOSIS — M6281 Muscle weakness (generalized): Secondary | ICD-10-CM | POA: Diagnosis not present

## 2019-11-09 DIAGNOSIS — R262 Difficulty in walking, not elsewhere classified: Secondary | ICD-10-CM | POA: Diagnosis not present

## 2019-11-16 DIAGNOSIS — M6281 Muscle weakness (generalized): Secondary | ICD-10-CM | POA: Diagnosis not present

## 2019-11-16 DIAGNOSIS — R262 Difficulty in walking, not elsewhere classified: Secondary | ICD-10-CM | POA: Diagnosis not present

## 2019-11-16 DIAGNOSIS — M25572 Pain in left ankle and joints of left foot: Secondary | ICD-10-CM | POA: Diagnosis not present

## 2019-11-16 DIAGNOSIS — M25672 Stiffness of left ankle, not elsewhere classified: Secondary | ICD-10-CM | POA: Diagnosis not present

## 2019-11-20 DIAGNOSIS — M25572 Pain in left ankle and joints of left foot: Secondary | ICD-10-CM | POA: Diagnosis not present

## 2019-11-20 DIAGNOSIS — M25672 Stiffness of left ankle, not elsewhere classified: Secondary | ICD-10-CM | POA: Diagnosis not present

## 2019-11-20 DIAGNOSIS — R262 Difficulty in walking, not elsewhere classified: Secondary | ICD-10-CM | POA: Diagnosis not present

## 2019-11-20 DIAGNOSIS — M6281 Muscle weakness (generalized): Secondary | ICD-10-CM | POA: Diagnosis not present

## 2019-11-23 DIAGNOSIS — M25672 Stiffness of left ankle, not elsewhere classified: Secondary | ICD-10-CM | POA: Diagnosis not present

## 2019-11-23 DIAGNOSIS — M6281 Muscle weakness (generalized): Secondary | ICD-10-CM | POA: Diagnosis not present

## 2019-11-23 DIAGNOSIS — M25572 Pain in left ankle and joints of left foot: Secondary | ICD-10-CM | POA: Diagnosis not present

## 2019-11-23 DIAGNOSIS — R262 Difficulty in walking, not elsewhere classified: Secondary | ICD-10-CM | POA: Diagnosis not present

## 2019-11-27 DIAGNOSIS — M25672 Stiffness of left ankle, not elsewhere classified: Secondary | ICD-10-CM | POA: Diagnosis not present

## 2019-11-27 DIAGNOSIS — R262 Difficulty in walking, not elsewhere classified: Secondary | ICD-10-CM | POA: Diagnosis not present

## 2019-11-27 DIAGNOSIS — M6281 Muscle weakness (generalized): Secondary | ICD-10-CM | POA: Diagnosis not present

## 2019-11-27 DIAGNOSIS — M25572 Pain in left ankle and joints of left foot: Secondary | ICD-10-CM | POA: Diagnosis not present

## 2019-11-27 DIAGNOSIS — S93432D Sprain of tibiofibular ligament of left ankle, subsequent encounter: Secondary | ICD-10-CM | POA: Diagnosis not present

## 2019-12-31 DIAGNOSIS — L718 Other rosacea: Secondary | ICD-10-CM | POA: Diagnosis not present

## 2019-12-31 DIAGNOSIS — D225 Melanocytic nevi of trunk: Secondary | ICD-10-CM | POA: Diagnosis not present

## 2019-12-31 DIAGNOSIS — L821 Other seborrheic keratosis: Secondary | ICD-10-CM | POA: Diagnosis not present

## 2020-02-04 DIAGNOSIS — I493 Ventricular premature depolarization: Secondary | ICD-10-CM | POA: Diagnosis not present

## 2020-02-04 DIAGNOSIS — E10319 Type 1 diabetes mellitus with unspecified diabetic retinopathy without macular edema: Secondary | ICD-10-CM | POA: Diagnosis not present

## 2020-02-04 DIAGNOSIS — L719 Rosacea, unspecified: Secondary | ICD-10-CM | POA: Diagnosis not present

## 2020-02-04 DIAGNOSIS — E038 Other specified hypothyroidism: Secondary | ICD-10-CM | POA: Diagnosis not present

## 2020-02-04 DIAGNOSIS — I10 Essential (primary) hypertension: Secondary | ICD-10-CM | POA: Diagnosis not present

## 2020-02-04 DIAGNOSIS — K760 Fatty (change of) liver, not elsewhere classified: Secondary | ICD-10-CM | POA: Diagnosis not present

## 2020-02-04 DIAGNOSIS — E559 Vitamin D deficiency, unspecified: Secondary | ICD-10-CM | POA: Diagnosis not present

## 2020-02-04 DIAGNOSIS — R82998 Other abnormal findings in urine: Secondary | ICD-10-CM | POA: Diagnosis not present

## 2020-02-04 DIAGNOSIS — E7849 Other hyperlipidemia: Secondary | ICD-10-CM | POA: Diagnosis not present

## 2020-02-26 ENCOUNTER — Ambulatory Visit: Payer: Self-pay | Admitting: Student

## 2020-02-26 DIAGNOSIS — S82852A Displaced trimalleolar fracture of left lower leg, initial encounter for closed fracture: Secondary | ICD-10-CM

## 2020-02-26 DIAGNOSIS — S82852D Displaced trimalleolar fracture of left lower leg, subsequent encounter for closed fracture with routine healing: Secondary | ICD-10-CM | POA: Diagnosis not present

## 2020-02-26 DIAGNOSIS — S93432D Sprain of tibiofibular ligament of left ankle, subsequent encounter: Secondary | ICD-10-CM | POA: Diagnosis not present

## 2020-03-18 ENCOUNTER — Other Ambulatory Visit (HOSPITAL_COMMUNITY)
Admission: RE | Admit: 2020-03-18 | Discharge: 2020-03-18 | Disposition: A | Discharge status: Home / Self Care | Payer: Medicare Other | Source: Ambulatory Visit | Attending: Student | Admitting: Student

## 2020-03-18 ENCOUNTER — Encounter (HOSPITAL_COMMUNITY): Payer: Self-pay | Admitting: Student

## 2020-03-18 ENCOUNTER — Other Ambulatory Visit: Payer: Self-pay

## 2020-03-18 DIAGNOSIS — Z20822 Contact with and (suspected) exposure to covid-19: Secondary | ICD-10-CM | POA: Diagnosis not present

## 2020-03-18 DIAGNOSIS — Z01812 Encounter for preprocedural laboratory examination: Secondary | ICD-10-CM | POA: Insufficient documentation

## 2020-03-18 LAB — SARS CORONAVIRUS 2 (TAT 6-24 HRS): SARS Coronavirus 2: NEGATIVE

## 2020-03-18 NOTE — Progress Notes (Signed)
Pt denies SOB, chest pain, and being under the care of a cardiologist. Pt stated that she is under the care of Dr. Reynold Bowen, PCP and Endocrinologist. Pt denies having a chest x ray in the last year. Pt denies recent labs. Pt made aware to decrease basal rate on insulin pump at [facility] by 20% if not provided with pre-op instructions by endocrinologist. Pt stated that she does not take Aspirin. Pt made aware to stop taking  vitamins, fish oil gummies, Turmeric and herbal medications. Do not take any NSAIDs ie: Ibuprofen, Advil, Naproxen (Aleve), Motrin, BC and Goody Powder. Pt made aware to check CBG prior to arrival to hospital on DOS. Pt made aware to treat a BG < 70 with 4 ounces of apple juice, wait 15 minutes after intervention to recheck BG, if BG remains < 70, call Short Stay unit to speak with a nurse. Pt reminded to quarantine. Pt verbalized understanding of all pre-op instructions. Nurse made Larene Beach, Diabetes Coordinator aware of pt surgery, no new orders.

## 2020-03-18 NOTE — H&P (Signed)
Orthopaedic Trauma Service (OTS) H&P  Patient ID: Jamie Mays MRN: [id number] DOB/AGE: 1950-09-14 69 y.o.  Reason for Surgery: Removable of painful orthopaedic hardware left ankle  HPI: Jamie Mays is an 69 y.o. female with past medical history significant for type 1 diabetes and hypertension  presenting for surgery for removal hardware from left ankle.  Patient underwent ORIF left trimalleolar ankle fracture by Dr. Doreatha Martin on 07/19/2019. She has followed up in the OTS clinic at regular intervals over the last 8 months.  Has advanced to weightbearing as tolerated on left lower extremity and has transitioned off of her assistive device.  Her fracture is fully healed but she continues to have pain in the left ankle particularly when ambulating. Has requested hardware removal at this time.   Past Medical History:  Diagnosis Date  . Ankle fracture    left  . Arthritis   . Diabetes mellitus   . Family history of adverse reaction to anesthesia    " they had difficulty waking my dad up; he was about 8-43 years old."  . Family history of MI (myocardial infarction)   . GERD (gastroesophageal reflux disease)   . Hypertension   . Hypothyroidism   . PVC (premature ventricular contraction)   . Smoker    former  " quit about 40 years ago"  . Syncope   . Wears glasses     Past Surgical History:  Procedure Laterality Date  . CARDIAC CATHETERIZATION  2007   neg  . CATARACT EXTRACTION W/ INTRAOCULAR LENS  IMPLANT, BILATERAL    . CESAREAN SECTION    . ORIF ANKLE FRACTURE Left 07/19/2019   Procedure: OPEN REDUCTION INTERNAL FIXATION (ORIF) ANKLE FRACTURE;  Surgeon: Shona Needles, MD;  Location: [employer];  Service: Orthopedics;  Laterality: Left;    Family History  Problem Relation Age of Onset  . Coronary artery disease Mother   . Kidney disease Mother   . Diabetes Mother   . Heart disease Mother        heart attack  . Hypertension Mother   . Diabetes Sister   . Diabetes Brother   .  Cancer Brother        lung cancer  . Diabetes Maternal Grandmother   . Cancer Brother 49       lungs, brain, chest wall, liver cancer, stage 4  . Cancer Other 70       lung , brain    Social History:  reports that she quit smoking about 38 years ago. Her smoking use included cigarettes. She has never used smokeless tobacco. She reports current alcohol use. She reports that she does not use drugs.  Allergies:  Allergies  Allergen Reactions  . Doxycycline Other (See Comments)    Joint pain  . Lisinopril Cough  . Oxycodone     Hallucinations     Medications:  Prior to Admission:  No medications prior to admission.    ROS: Constitutional: No fever or chills Vision: No changes in vision ENT: No difficulty swallowing CV: No chest pain Pulm: No SOB or wheezing GI: No nausea or vomiting GU: No urgency or inability to hold urine Skin: No poor wound healing Neurologic: No numbness or tingling Psychiatric: No depression or anxiety Heme: No bruising Allergic: No reaction to medications or food   Exam: There were no vitals taken for this visit. General: No acute distress Orientation: Alert and oriented x3 Mood and Affect: Mood and affect appropriate.  Pleasant and cooperative Gait:  Normal, reciprocal gait. Coordination and balance: Within normal limits  Left lower extremity: Well-healed surgical incisions.  Mild tenderness with palpation and motion of the ankle.  Has full ankle dorsiflexion and plantarflexion.  Motor and sensory function is intact.  She is neurovascularly intact  Right lower extremity: Skin without lesions. No tenderness to palpation. Full painless ROM, full strength in each muscle groups without evidence of instability.   Medical Decision Making: Data: Imaging: AP, lateral, mortise view of the left ankle reveals medial lateral fixation in place with no signs of hardware failure or loosening.  Ankle mortise well-maintained.  Fracturesis fully healed  Labs:  No results found for this or any previous visit (from the past 168 hour(s)).   Assessment/Plan: 69 year old female status post ORIF of left trimalleolar ankle fracture on 07/19/2019, presenting for removal of painful hardware   Patient now 8 months out from surgery, has gone on to fully heal her fracture but continues to have pain.  Recommend proceeding with hardware removal at this time.  Recommend the procedure discussed with the patient. Risks discussed included bleeding, infection, refracture of the ankle, damage to surrounding nerves and blood vessels, continued pain, stiffness, post-traumatic arthritis, DVT/PE, compartment syndrome, and anesthesia complications.  Patient states understanding of these risk and agrees to proceed with surgery.  Consent will be obtained.  We will allow patient to be weightbearing as tolerated on the left lower extremity and plan for her to be discharged from PACU postoperatively.  Angle Dirusso A. Carmie Kanner [facility] [phone] (office) [email or web address]

## 2020-03-18 NOTE — Anesthesia Preprocedure Evaluation (Addendum)
Anesthesia Evaluation  Patient identified by MRN, date of birth, ID band Patient awake    Reviewed: Allergy & Precautions, NPO status , Patient's Chart, lab work & pertinent test results  Airway Mallampati: II  TM Distance: >3 FB Neck ROM: Full    Dental no notable dental hx. (+) Teeth Intact, Dental Advisory Given   Pulmonary neg pulmonary ROS, former smoker,    Pulmonary exam normal breath sounds clear to auscultation       Cardiovascular hypertension, Pt. on medications and Pt. on home beta blockers negative cardio ROS Normal cardiovascular exam Rhythm:Regular Rate:Normal  Nuclear stress test 11/07/17: low risk study, EF 55-65%, small area of anteroapical ischemia vs. Breast attenuation, normal regional and global systolic function   Neuro/Psych negative psych ROS   GI/Hepatic Neg liver ROS, GERD  Medicated,  Endo/Other  diabetes, Type 1, Insulin DependentHypothyroidism   Renal/GU negative Renal ROS  negative genitourinary   Musculoskeletal  (+) Arthritis ,   Abdominal   Peds negative pediatric ROS (+)  Hematology   Anesthesia Other Findings   Reproductive/Obstetrics                           Anesthesia Physical Anesthesia Plan  ASA: III  Anesthesia Plan: General   Post-op Pain Management:    Induction: Intravenous  PONV Risk Score and Plan: 3 and Treatment may vary due to age or medical condition, Ondansetron, Dexamethasone and Midazolam  Airway Management Planned: LMA  Additional Equipment: None  Intra-op Plan:   Post-operative Plan:   Informed Consent:     Dental advisory given  Plan Discussed with: CRNA and Anesthesiologist  Anesthesia Plan Comments:         Anesthesia Quick Evaluation

## 2020-03-19 ENCOUNTER — Ambulatory Visit (HOSPITAL_COMMUNITY): Payer: Medicare Other

## 2020-03-19 ENCOUNTER — Encounter (HOSPITAL_COMMUNITY): Payer: Self-pay | Admitting: Student

## 2020-03-19 ENCOUNTER — Encounter (HOSPITAL_COMMUNITY)
Admission: RE | Disposition: A | Discharge status: Home / Self Care | Payer: Self-pay | Source: Home / Self Care | Attending: Student

## 2020-03-19 ENCOUNTER — Ambulatory Visit (HOSPITAL_COMMUNITY)
Admission: RE | Admit: 2020-03-19 | Discharge: 2020-03-19 | Disposition: A | Discharge status: Home / Self Care | Payer: Medicare Other | Attending: Student | Admitting: Student

## 2020-03-19 ENCOUNTER — Ambulatory Visit (HOSPITAL_COMMUNITY): Payer: Medicare Other | Admitting: Anesthesiology

## 2020-03-19 ENCOUNTER — Other Ambulatory Visit: Payer: Self-pay

## 2020-03-19 DIAGNOSIS — S82852A Displaced trimalleolar fracture of left lower leg, initial encounter for closed fracture: Secondary | ICD-10-CM | POA: Insufficient documentation

## 2020-03-19 DIAGNOSIS — M1990 Unspecified osteoarthritis, unspecified site: Secondary | ICD-10-CM | POA: Diagnosis not present

## 2020-03-19 DIAGNOSIS — Z87891 Personal history of nicotine dependence: Secondary | ICD-10-CM | POA: Diagnosis not present

## 2020-03-19 DIAGNOSIS — I10 Essential (primary) hypertension: Secondary | ICD-10-CM | POA: Diagnosis not present

## 2020-03-19 DIAGNOSIS — Z472 Encounter for removal of internal fixation device: Secondary | ICD-10-CM | POA: Diagnosis not present

## 2020-03-19 DIAGNOSIS — Z794 Long term (current) use of insulin: Secondary | ICD-10-CM | POA: Diagnosis not present

## 2020-03-19 DIAGNOSIS — S82302D Unspecified fracture of lower end of left tibia, subsequent encounter for closed fracture with routine healing: Secondary | ICD-10-CM | POA: Diagnosis not present

## 2020-03-19 DIAGNOSIS — E119 Type 2 diabetes mellitus without complications: Secondary | ICD-10-CM | POA: Insufficient documentation

## 2020-03-19 DIAGNOSIS — T1490XA Injury, unspecified, initial encounter: Secondary | ICD-10-CM

## 2020-03-19 DIAGNOSIS — S82852D Displaced trimalleolar fracture of left lower leg, subsequent encounter for closed fracture with routine healing: Secondary | ICD-10-CM | POA: Diagnosis not present

## 2020-03-19 DIAGNOSIS — S82832D Other fracture of upper and lower end of left fibula, subsequent encounter for closed fracture with routine healing: Secondary | ICD-10-CM | POA: Diagnosis not present

## 2020-03-19 DIAGNOSIS — T8484XA Pain due to internal orthopedic prosthetic devices, implants and grafts, initial encounter: Secondary | ICD-10-CM | POA: Diagnosis not present

## 2020-03-19 DIAGNOSIS — S93432A Sprain of tibiofibular ligament of left ankle, initial encounter: Secondary | ICD-10-CM | POA: Diagnosis not present

## 2020-03-19 DIAGNOSIS — E109 Type 1 diabetes mellitus without complications: Secondary | ICD-10-CM | POA: Diagnosis not present

## 2020-03-19 HISTORY — DX: Pain due to internal orthopedic prosthetic devices, implants and grafts, initial encounter: T84.84XA

## 2020-03-19 HISTORY — PX: HARDWARE REMOVAL: SHX979

## 2020-03-19 LAB — BASIC METABOLIC PANEL
Anion gap: 9 (ref 5–15)
BUN: 16 mg/dL (ref 8–23)
CO2: 27 mmol/L (ref 22–32)
Calcium: 9.5 mg/dL (ref 8.9–10.3)
Chloride: 102 mmol/L (ref 98–111)
Creatinine, Ser: 0.91 mg/dL (ref 0.44–1.00)
GFR calc Af Amer: >60 mL/min (ref >60)
GFR calc non Af Amer: >60 mL/min (ref >60)
Glucose, Bld: 173 mg/dL — ABNORMAL HIGH (ref 70–99)
Potassium: 3.7 mmol/L (ref 3.5–5.1)
Sodium: 138 mmol/L (ref 135–145)

## 2020-03-19 LAB — CBC
HCT: 45.7 % (ref 36.0–46.0)
Hemoglobin: 15.4 g/dL — ABNORMAL HIGH (ref 12.0–15.0)
MCH: 30.1 pg (ref 26.0–34.0)
MCHC: 33.7 g/dL (ref 30.0–36.0)
MCV: 89.4 fL (ref 80.0–100.0)
Platelets: 222 10*3/uL (ref 150–400)
RBC: 5.11 MIL/uL (ref 3.87–5.11)
RDW: 12.8 % (ref 11.5–15.5)
WBC: 5.2 10*3/uL (ref 4.0–10.5)
nRBC: 0 % (ref 0.0–0.2)

## 2020-03-19 LAB — GLUCOSE, CAPILLARY
Glucose-Capillary: 178 mg/dL — ABNORMAL HIGH (ref 70–99)
Glucose-Capillary: 159 mg/dL — ABNORMAL HIGH (ref 70–99)

## 2020-03-19 SURGERY — REMOVAL, HARDWARE
Anesthesia: General | Site: Ankle | Laterality: Left

## 2020-03-19 MED ORDER — MIDAZOLAM HCL 2 MG/2ML IJ SOLN
INTRAMUSCULAR | Status: AC
  Filled 2020-03-19: qty 2

## 2020-03-19 MED ORDER — FENTANYL CITRATE (PF) 250 MCG/5ML IJ SOLN
INTRAMUSCULAR | Status: DC | PRN
Start: 2020-03-19 — End: 2020-03-19
  Administered 2020-03-19: 50 ug via INTRAVENOUS

## 2020-03-19 MED ORDER — BUPIVACAINE HCL (PF) 0.25 % IJ SOLN
INTRAMUSCULAR | Status: AC
  Filled 2020-03-19: qty 30

## 2020-03-19 MED ORDER — 0.9 % SODIUM CHLORIDE (POUR BTL) OPTIME
TOPICAL | Status: DC | PRN
  Administered 2020-03-19: 1000 mL

## 2020-03-19 MED ORDER — CHLORHEXIDINE GLUCONATE 0.12 % MT SOLN
OROMUCOSAL | Status: AC
  Administered 2020-03-19: 15 mL via OROMUCOSAL
  Filled 2020-03-19: qty 15

## 2020-03-19 MED ORDER — FENTANYL CITRATE (PF) 100 MCG/2ML IJ SOLN
25.0000 ug | INTRAMUSCULAR | Status: DC | PRN
  Administered 2020-03-19 (×2): 25 ug via INTRAVENOUS

## 2020-03-19 MED ORDER — EPHEDRINE SULFATE-NACL 50-0.9 MG/10ML-% IV SOSY
PREFILLED_SYRINGE | INTRAVENOUS | Status: DC | PRN
  Administered 2020-03-19: 5 mg via INTRAVENOUS

## 2020-03-19 MED ORDER — ORAL CARE MOUTH RINSE: 15.0000 mL | Freq: Once | OROMUCOSAL | Status: AC

## 2020-03-19 MED ORDER — LIDOCAINE 2% (20 MG/ML) 5 ML SYRINGE
INTRAMUSCULAR | Status: DC | PRN
  Administered 2020-03-19: 100 mg via INTRAVENOUS

## 2020-03-19 MED ORDER — PROPOFOL 10 MG/ML IV BOLUS
INTRAVENOUS | Status: AC
  Filled 2020-03-19: qty 20

## 2020-03-19 MED ORDER — CHLORHEXIDINE GLUCONATE 0.12 % MT SOLN: 15.0000 mL | Freq: Once | OROMUCOSAL | Status: AC

## 2020-03-19 MED ORDER — LACTATED RINGERS IV SOLN: INTRAVENOUS | Status: DC

## 2020-03-19 MED ORDER — PROPOFOL 10 MG/ML IV BOLUS
INTRAVENOUS | Status: DC | PRN
  Administered 2020-03-19: 160 mg via INTRAVENOUS

## 2020-03-19 MED ORDER — ACETAMINOPHEN 10 MG/ML IV SOLN
INTRAVENOUS | Status: AC
  Filled 2020-03-19: qty 100

## 2020-03-19 MED ORDER — ONDANSETRON HCL 4 MG/2ML IJ SOLN
INTRAMUSCULAR | Status: AC
  Filled 2020-03-19: qty 2

## 2020-03-19 MED ORDER — ONDANSETRON HCL 4 MG/2ML IJ SOLN: 4.0000 mg | Freq: Once | INTRAMUSCULAR | Status: DC | PRN

## 2020-03-19 MED ORDER — LIDOCAINE 2% (20 MG/ML) 5 ML SYRINGE
INTRAMUSCULAR | Status: AC
  Filled 2020-03-19: qty 5

## 2020-03-19 MED ORDER — DEXAMETHASONE SODIUM PHOSPHATE 10 MG/ML IJ SOLN
INTRAMUSCULAR | Status: AC
  Filled 2020-03-19: qty 1

## 2020-03-19 MED ORDER — BUPIVACAINE HCL (PF) 0.25 % IJ SOLN
INTRAMUSCULAR | Status: DC | PRN
  Administered 2020-03-19: 10 mL

## 2020-03-19 MED ORDER — ACETAMINOPHEN 10 MG/ML IV SOLN: 1000.0000 mg | Freq: Once | INTRAVENOUS | Status: DC | PRN

## 2020-03-19 MED ORDER — MIDAZOLAM HCL 2 MG/2ML IJ SOLN
INTRAMUSCULAR | Status: DC | PRN
  Administered 2020-03-19: 2 mg via INTRAVENOUS

## 2020-03-19 MED ORDER — CEFAZOLIN SODIUM-DEXTROSE 2-4 GM/100ML-% IV SOLN
2.0000 g | INTRAVENOUS | Status: AC
  Administered 2020-03-19: 2 g via INTRAVENOUS
  Filled 2020-03-19: qty 100

## 2020-03-19 MED ORDER — ONDANSETRON HCL 4 MG/2ML IJ SOLN
INTRAMUSCULAR | Status: DC | PRN
  Administered 2020-03-19: 4 mg via INTRAVENOUS

## 2020-03-19 MED ORDER — DEXAMETHASONE SODIUM PHOSPHATE 10 MG/ML IJ SOLN
INTRAMUSCULAR | Status: DC | PRN
  Administered 2020-03-19: 5 mg via INTRAVENOUS

## 2020-03-19 MED ORDER — FENTANYL CITRATE (PF) 100 MCG/2ML IJ SOLN
INTRAMUSCULAR | Status: AC
  Filled 2020-03-19: qty 2

## 2020-03-19 MED ORDER — FENTANYL CITRATE (PF) 250 MCG/5ML IJ SOLN
INTRAMUSCULAR | Status: AC
  Filled 2020-03-19: qty 5

## 2020-03-19 SURGICAL SUPPLY — 59 items
BANDAGE ESMARK 6X9 LF (GAUZE/BANDAGES/DRESSINGS) ×1 IMPLANT
BNDG COHESIVE 6X5 TAN STRL LF (GAUZE/BANDAGES/DRESSINGS) ×3 IMPLANT
BNDG ELASTIC 4X5.8 VLCR STR LF (GAUZE/BANDAGES/DRESSINGS) ×3 IMPLANT
BNDG ELASTIC 6X5.8 VLCR STR LF (GAUZE/BANDAGES/DRESSINGS) ×3 IMPLANT
BNDG ESMARK 6X9 LF (GAUZE/BANDAGES/DRESSINGS) ×3
BNDG GAUZE ELAST 4 BULKY (GAUZE/BANDAGES/DRESSINGS) ×6 IMPLANT
BRUSH SCRUB EZ PLAIN DRY (MISCELLANEOUS) ×6 IMPLANT
CHLORAPREP W/TINT 26 (MISCELLANEOUS) ×3 IMPLANT
CLOSURE STERI-STRIP 1/4X4 (GAUZE/BANDAGES/DRESSINGS) ×3 IMPLANT
CLOSURE WOUND 1/2 X4 (GAUZE/BANDAGES/DRESSINGS)
COVER SURGICAL LIGHT HANDLE (MISCELLANEOUS) ×6 IMPLANT
COVER WAND RF STERILE (DRAPES) ×3 IMPLANT
CUFF TOURN SGL QUICK 18X4 (TOURNIQUET CUFF) IMPLANT
CUFF TOURN SGL QUICK 24 (TOURNIQUET CUFF)
CUFF TOURN SGL QUICK 34 (TOURNIQUET CUFF)
CUFF TRNQT CYL 24X4X16.5-23 (TOURNIQUET CUFF) IMPLANT
CUFF TRNQT CYL 34X4.125X (TOURNIQUET CUFF) IMPLANT
DRAPE C-ARM 42X72 X-RAY (DRAPES) ×3 IMPLANT
DRAPE C-ARMOR (DRAPES) ×3 IMPLANT
DRAPE U-SHAPE 47X51 STRL (DRAPES) ×3 IMPLANT
DRSG ADAPTIC 3X8 NADH LF (GAUZE/BANDAGES/DRESSINGS) ×3 IMPLANT
ELECT REM PT RETURN 9FT ADLT (ELECTROSURGICAL) ×3
ELECTRODE REM PT RTRN 9FT ADLT (ELECTROSURGICAL) ×1 IMPLANT
GAUZE SPONGE 4X4 12PLY STRL (GAUZE/BANDAGES/DRESSINGS) ×3 IMPLANT
GLOVE BIO SURGEON STRL SZ 6.5 (GLOVE) ×6 IMPLANT
GLOVE BIO SURGEON STRL SZ7.5 (GLOVE) ×12 IMPLANT
GLOVE BIO SURGEONS STRL SZ 6.5 (GLOVE) ×3
GLOVE BIOGEL PI IND STRL 6.5 (GLOVE) ×1 IMPLANT
GLOVE BIOGEL PI IND STRL 7.5 (GLOVE) ×1 IMPLANT
GLOVE BIOGEL PI INDICATOR 6.5 (GLOVE) ×2
GLOVE BIOGEL PI INDICATOR 7.5 (GLOVE) ×2
GOWN STRL REUS W/ TWL LRG LVL3 (GOWN DISPOSABLE) ×2 IMPLANT
GOWN STRL REUS W/TWL LRG LVL3 (GOWN DISPOSABLE) ×6
KIT BASIN OR (CUSTOM PROCEDURE TRAY) ×3 IMPLANT
KIT TURNOVER KIT B (KITS) ×3 IMPLANT
MANIFOLD NEPTUNE II (INSTRUMENTS) ×3 IMPLANT
NEEDLE 22X1 1/2 (OR ONLY) (NEEDLE) IMPLANT
NS IRRIG 1000ML POUR BTL (IV SOLUTION) ×3 IMPLANT
PACK ORTHO EXTREMITY (CUSTOM PROCEDURE TRAY) ×3 IMPLANT
PAD ARMBOARD 7.5X6 YLW CONV (MISCELLANEOUS) ×6 IMPLANT
PAD CAST 4YDX4 CTTN HI CHSV (CAST SUPPLIES) ×1 IMPLANT
PADDING CAST COTTON 4X4 STRL (CAST SUPPLIES) ×3
PADDING CAST COTTON 6X4 STRL (CAST SUPPLIES) ×9 IMPLANT
SPONGE LAP 18X18 RF (DISPOSABLE) ×3 IMPLANT
STAPLER VISISTAT 35W (STAPLE) IMPLANT
STOCKINETTE IMPERVIOUS LG (DRAPES) ×3 IMPLANT
STRIP CLOSURE SKIN 1/2X4 (GAUZE/BANDAGES/DRESSINGS) IMPLANT
SUCTION FRAZIER HANDLE 10FR (MISCELLANEOUS)
SUCTION TUBE FRAZIER 10FR DISP (MISCELLANEOUS) IMPLANT
SUT MNCRL AB 3-0 PS2 18 (SUTURE) ×3 IMPLANT
SUT MON AB 2-0 CT1 36 (SUTURE) ×3 IMPLANT
SYR CONTROL 10ML LL (SYRINGE) IMPLANT
TOWEL GREEN STERILE (TOWEL DISPOSABLE) ×6 IMPLANT
TOWEL GREEN STERILE FF (TOWEL DISPOSABLE) ×6 IMPLANT
TUBE CONNECTING 12'X1/4 (SUCTIONS) ×1
TUBE CONNECTING 12X1/4 (SUCTIONS) ×2 IMPLANT
UNDERPAD 30X36 HEAVY ABSORB (UNDERPADS AND DIAPERS) ×3 IMPLANT
WATER STERILE IRR 1000ML POUR (IV SOLUTION) ×6 IMPLANT
YANKAUER SUCT BULB TIP NO VENT (SUCTIONS) ×3 IMPLANT

## 2020-03-19 NOTE — Interval H&P Note (Signed)
History and Physical Interval Note:  03/19/2020 8:20 AM  Jamie Mays  has presented today for surgery, with the diagnosis of Painful orthopaedic hardware.  The various methods of treatment have been discussed with the patient and family. After consideration of risks, benefits and other options for treatment, the patient has consented to  Procedure(s): HARDWARE REMOVAL LEFT ANKLE (Left) as a surgical intervention.  The patient's history has been reviewed, patient examined, no change in status, stable for surgery.  I have reviewed the patient's chart and labs.  Questions were answered to the patient's satisfaction.     Lennette Bihari P Jowana Thumma

## 2020-03-19 NOTE — Op Note (Signed)
Orthopaedic Surgery Operative Note (CSN: [id number] ) Date of Surgery: 03/19/2020  Admit Date: 03/19/2020   Diagnoses: Pre-Op Diagnoses: Painful orthopaedic hardware left ankle Left trimalleolar ankle fracture s/p ORIF   Post-Op Diagnosis: Same  Procedures: CPT 20680-Removal of hardware left ankle  Surgeons : Primary: Shona Needles, MD  Assistant: Patrecia Pace, PA-C  Location: OR 3   Anesthesia:General  Antibiotics: Ancef 2g preop   Tourniquet time:None used  Estimated Blood MOQH:47 mL  Complications:None   Specimens:None   Implants: Implant Name Type Inv. Item Serial No. Manufacturer Lot No. LRB No. Used Action  PLATE POST FIB 6.5Y65 5H - [id number] Plate PLATE POST FIB 2.7N17 5H  [employer]  Left 1 Explanted  SCREW CORT EVOS ST 3.5X12 - [id number] Screw SCREW CORT EVOS ST 3.5X12  [employer]  Left 3 Explanted  SCREW CORT EVOS ST T8 2.7X15 - [id number] Screw SCREW CORT 2.7X15 T8 ST EVOS  [employer]  Left 1 Explanted  SCREW CORT ST EVOS 2.7X75 - [id number] Screw SCREW CORT ST EVOS 2.7X75  [employer]  Left 1 Explanted  SCREW CTX 3.5X50MM EVOS - [id number] Screw SCREW CTX 3.5X50MM EVOS  [employer]  Left 1 Explanted  SCREW LOCK ST EVOS 2.7X19 - [id number] Screw SCREW CORT 2.7X19 ST STAR EVOS  [employer]  Left 1 Explanted  SCREW EVOS 2.7X18 LOCK T8 - [id number] Screw SCREW EVOS 2.7X18 LOCK T8  [employer]  Left 2 Explanted     Indications for Surgery: 69 year old female who sustained a left trimalleolar ankle fracture that underwent open reduction internal fixation in January 2021.  She subsequently healed her fracture uneventfully unfortunately she continued to have pain around her surgical site secondary to hardware.  I recommended proceeding with hardware removal.  Risks and benefits were discussed with the patient.  Risks included but not limited to bleeding,  infection, malunion, nonunion, ankle stiffness, ankle arthritis, nerve and blood vessel injury, even the possibility anesthetic complications.  She agreed to proceed with surgery and consent was obtained.  Operative Findings: Successful removal of hardware from left ankle.  Procedure: The patient was identified in the preoperative holding area. Consent was confirmed with the patient and their family and all questions were answered. The operative extremity was marked after confirmation with the patient. she was then brought back to the operating room by our anesthesia colleagues.  She was carefully transferred over to a radiolucent flat top table.  Her left lower extremity was then prepped and draped in usual sterile fashion.  A timeout was performed to verify the patient, the procedure, and the extremity.  Preoperative antibiotics were dosed.  Fluoroscopic imaging obtained showed the hardware that was in place.  I made an incision through her previous lateral scar and carried it down through skin subcutaneous tissue.  I exposed the plate and was able to remove the locking and nonlocking screws without difficulty.  The plate was removed without difficulty as well.  I then reopened a portion of the medial incision and used fluoroscopy as a guide to remove the 2.7 millimeter screw on the medial malleolus.  I then obtained fluoroscopic imaging showed that all hardware was removed.  The incisions were copiously irrigated.  They were closed with 2-0 Vicryl and 3-0 Monocryl.  Local anesthetic was injected sterilely into the surgical field.  A sterile dressing was placed.  Patient was awoken from anesthesia and taken to the PACU  in stable condition.  Post Op Plan/Instructions: Patient will be weightbearing as tolerated to left lower extremity.  No DVT prophylaxis is needed in this ambulatory patient.  Patient will be discharged home from the PACU.  She will return to see me in 2 weeks for wound check.  I was  present and performed the entire surgery.  Patrecia Pace, PA-C did assist me throughout the case. An assistant was necessary given the difficulty in approach, maintenance of reduction and ability to instrument the fracture.   Katha Hamming, MD [facility]

## 2020-03-19 NOTE — Anesthesia Postprocedure Evaluation (Signed)
Anesthesia Post Note  Patient: Jamie Mays  Procedure(s) Performed: HARDWARE REMOVAL LEFT ANKLE (Left Ankle)     Patient location during evaluation: PACU Anesthesia Type: General Level of consciousness: awake and alert Pain management: pain level controlled Vital Signs Assessment: post-procedure vital signs reviewed and stable Respiratory status: spontaneous breathing, nonlabored ventilation, respiratory function stable and patient connected to nasal cannula oxygen Cardiovascular status: blood pressure returned to baseline and stable Postop Assessment: no apparent nausea or vomiting Anesthetic complications: no   No complications documented.  Last Vitals:  Vitals:   03/19/20 1015 03/19/20 1030  BP: (!) 126/56 122/64  Pulse: (!) 50 (!) 50  Resp: 13 13  Temp:  (!) 36.3 C  SpO2: 95% 94%    Last Pain:  Vitals:   03/19/20 1030  TempSrc:   PainSc: 5                  Barnet Glasgow

## 2020-03-19 NOTE — Discharge Instructions (Addendum)
[facility] Discharge Instructions   General Discharge Instructions  WEIGHT BEARING STATUS: weightbearing as tolerated left lower extremity  RANGE OF MOTION/ACTIVITY: Okay for ankle motion as tolerated  Wound Care: Reinforce dressing with ace wrap as needed. You may remove your surgical dressing on post-op day #2 (Friday 03/21/20).   Incisions can be left open to air if there is no drainage. If incision continues to have drainage, follow wound care instructions below. Okay to shower if no drainage from incisions.  DVT/PE prophylaxis: None  Diet: as you were eating previously.  Can use over the counter stool softeners and bowel preparations, such as Miralax, to help with bowel movements.  Narcotics can be constipating.  Be sure to drink plenty of fluids  PAIN MEDICATION USE AND EXPECTATIONS  You have likely been given narcotic medications to help control your pain.  After a traumatic event that results in an fracture (broken bone) with or without surgery, it is ok to use narcotic pain medications to help control one's pain.  We understand that everyone responds to pain differently and each individual patient will be evaluated on a regular basis for the continued need for narcotic medications. Ideally, narcotic medication use should last no more than 6-8 weeks (coinciding with fracture healing).   As a patient it is your responsibility as well to monitor narcotic medication use and report the amount and frequency you use these medications when you come to your office visit.   We would also advise that if you are using narcotic medications, you should take a dose prior to therapy to maximize you participation.  IF YOU ARE ON NARCOTIC MEDICATIONS IT IS NOT PERMISSIBLE TO OPERATE A MOTOR VEHICLE (MOTORCYCLE/CAR/TRUCK/MOPED) OR HEAVY MACHINERY DO NOT MIX NARCOTICS WITH OTHER CNS (CENTRAL NERVOUS SYSTEM) DEPRESSANTS SUCH AS ALCOHOL   STOP SMOKING OR USING NICOTINE PRODUCTS!!!!  As  discussed nicotine severely impairs your body's ability to heal surgical and traumatic wounds but also impairs bone healing.  Wounds and bone heal by forming microscopic blood vessels (angiogenesis) and nicotine is a vasoconstrictor (essentially, shrinks blood vessels).  Therefore, if vasoconstriction occurs to these microscopic blood vessels they essentially disappear and are unable to deliver necessary nutrients to the healing tissue.  This is one modifiable factor that you can do to dramatically increase your chances of healing your injury.    (This means no smoking, no nicotine gum, patches, etc)  DO NOT USE NONSTEROIDAL ANTI-INFLAMMATORY DRUGS (NSAID'S)  Using products such as Advil (ibuprofen), Aleve (naproxen), Motrin (ibuprofen) for additional pain control during fracture healing can delay and/or prevent the healing response.  If you would like to take over the counter (OTC) medication, Tylenol (acetaminophen) is ok.  However, some narcotic medications that are given for pain control contain acetaminophen as well. Therefore, you should not exceed more than 4000 mg of tylenol in a day if you do not have liver disease.  Also note that there are may OTC medicines, such as cold medicines and allergy medicines that my contain tylenol as well.  If you have any questions about medications and/or interactions please ask your doctor/PA or your pharmacist.      ICE AND ELEVATE INJURED/OPERATIVE EXTREMITY  Using ice and elevating the injured extremity above your heart can help with swelling and pain control.  Icing in a pulsatile fashion, such as 20 minutes on and 20 minutes off, can be followed.    Do not place ice directly on skin. Make sure there is a  barrier between to skin and the ice pack.    Using frozen items such as frozen peas works well as the conform nicely to the are that needs to be iced.  USE AN ACE WRAP OR TED HOSE FOR SWELLING CONTROL  In addition to icing and elevation, Ace wraps or TED  hose are used to help limit and resolve swelling.  It is recommended to use Ace wraps or TED hose until you are informed to stop.    When using Ace Wraps start the wrapping distally (farthest away from the body) and wrap proximally (closer to the body)   Example: If you had surgery on your leg or thing and you do not have a splint on, start the ace wrap at the toes and work your way up to the thigh        If you had surgery on your upper extremity and do not have a splint on, start the ace wrap at your fingers and work your way up to the upper arm  [place]: [phone]   VISIT OUR WEBSITE FOR ADDITIONAL INFORMATION: [email or web address]     Discharge Wound Care Instructions  Do NOT apply any ointments, solutions or lotions to pin sites or surgical wounds.  These prevent needed drainage and even though solutions like hydrogen peroxide kill bacteria, they also damage cells lining the pin sites that help fight infection.  Applying lotions or ointments can keep the wounds moist and can cause them to breakdown and open up as well. This can increase the risk for infection. When in doubt call the office.  Surgical incisions should be dressed daily.  If any drainage is noted, use one layer of adaptic, then gauze, Kerlix, and an ace wrap.  Once the incision is completely dry and without drainage, it may be left open to air out.  Showering may begin 36-48 hours later.  Cleaning gently with soap and water.  Traumatic wounds should be dressed daily as well.    One layer of adaptic, gauze, Kerlix, then ace wrap.  The adaptic can be discontinued once the draining has ceased    If you have a wet to dry dressing: wet the gauze with saline the squeeze as much saline out so the gauze is moist (not soaking wet), place moistened gauze over wound, then place a dry gauze over the moist one, followed by Kerlix wrap, then ace wrap.

## 2020-03-19 NOTE — Anesthesia Procedure Notes (Signed)
Procedure Name: LMA Insertion Date/Time: 03/19/2020 8:50 AM Performed by: Bryson Corona, CRNA Pre-anesthesia Checklist: Patient identified, Emergency Drugs available, Suction available and Patient being monitored Patient Re-evaluated:Patient Re-evaluated prior to induction Oxygen Delivery Method: Circle System Utilized Preoxygenation: Pre-oxygenation with 100% oxygen Induction Type: IV induction LMA: LMA inserted LMA Size: 4.0 Number of attempts: 1 Placement Confirmation: positive ETCO2 Tube secured with: Tape Dental Injury: Teeth and Oropharynx as per pre-operative assessment

## 2020-03-19 NOTE — Transfer of Care (Signed)
Immediate Anesthesia Transfer of Care Note  Patient: Jamie Mays  Procedure(s) Performed: HARDWARE REMOVAL LEFT ANKLE (Left Ankle)  Patient Location: PACU  Anesthesia Type:General  Level of Consciousness: drowsy  Airway & Oxygen Therapy: Patient Spontanous Breathing and Patient connected to face mask oxygen  Post-op Assessment: Report given to RN and Post -op Vital signs reviewed and stable  Post vital signs: Reviewed and stable  Last Vitals:  Vitals Value Taken Time  BP 127/66 03/19/20 0944  Temp    Pulse 57 03/19/20 0944  Resp 14 03/19/20 0944  SpO2 100 % 03/19/20 0944  Vitals shown include unvalidated device data.  Last Pain:  Vitals:   03/19/20 0657  TempSrc:   PainSc: 0-No pain      Patients Stated Pain Goal: 2 (20/91/98 0221)  Complications: No complications documented.

## 2020-03-20 ENCOUNTER — Encounter (HOSPITAL_COMMUNITY): Payer: Self-pay | Admitting: Student

## 2020-04-01 DIAGNOSIS — S93432D Sprain of tibiofibular ligament of left ankle, subsequent encounter: Secondary | ICD-10-CM | POA: Diagnosis not present

## 2020-04-01 DIAGNOSIS — S82852D Displaced trimalleolar fracture of left lower leg, subsequent encounter for closed fracture with routine healing: Secondary | ICD-10-CM | POA: Diagnosis not present

## 2020-04-01 IMAGING — RF DG ANKLE 2V *L*
1 series · 5 of 5 positions shown · non-contrast
Comparison: 07/13/2019.

CLINICAL DATA: ORIF left ankle.

EXAM:
LEFT ANKLE - 2 VIEW; DG C-ARM 1-60 MIN

[Series 1: run · 5 of 5 slices shown]
[im 1/5]
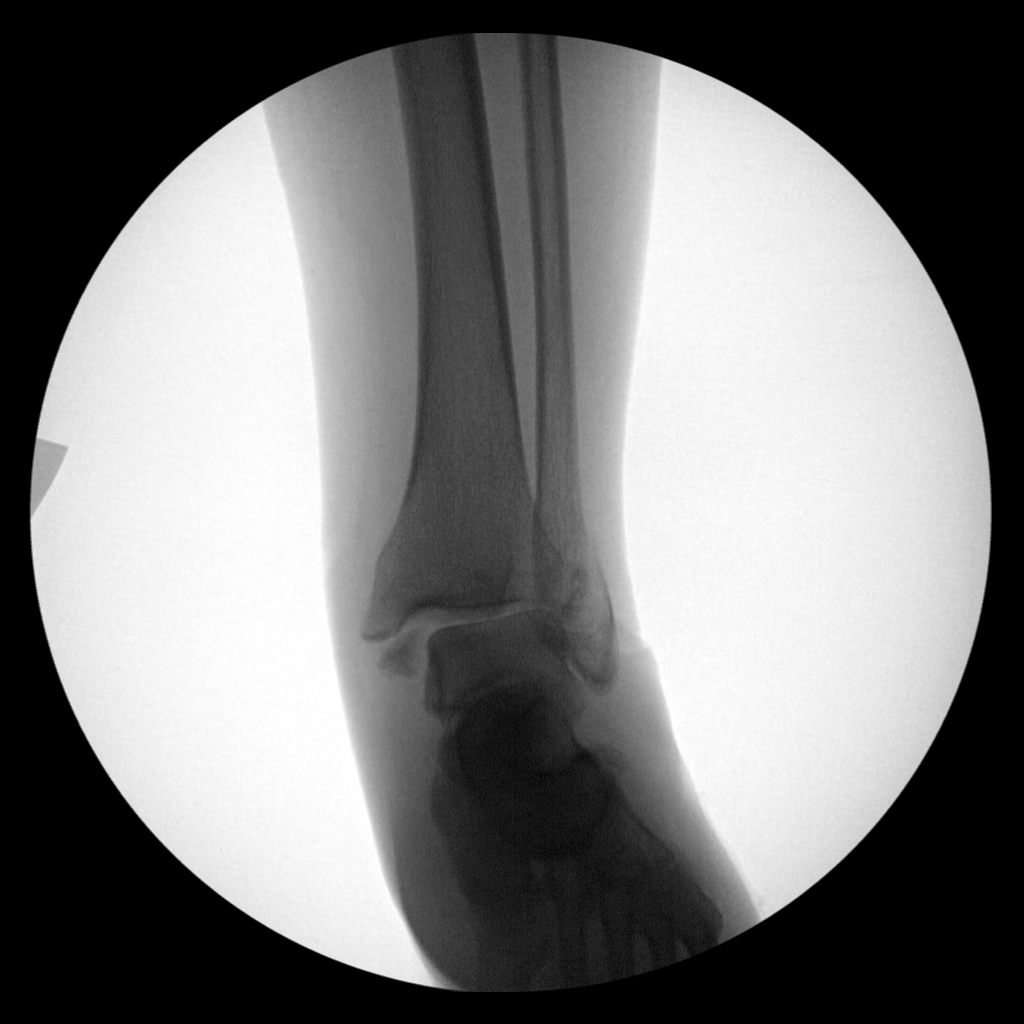
[im 2/5]
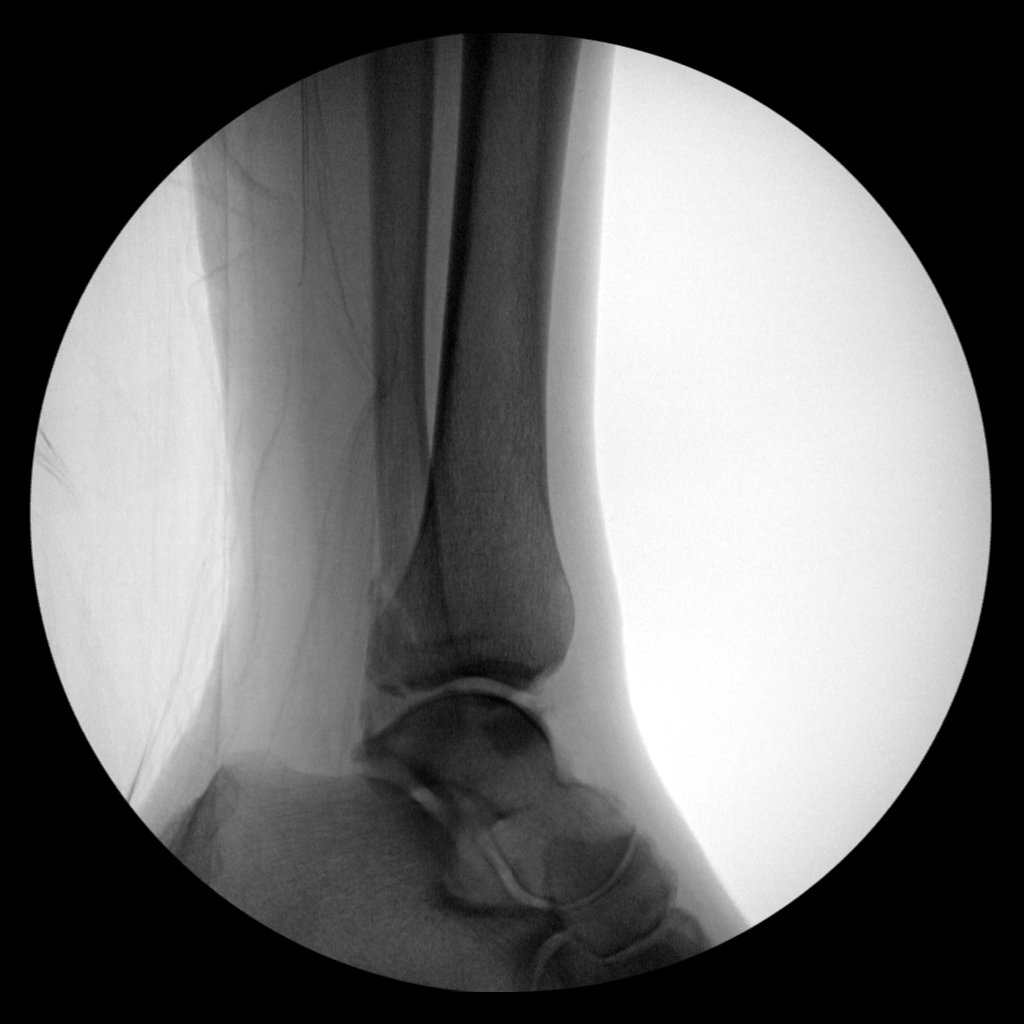
[im 3/5]
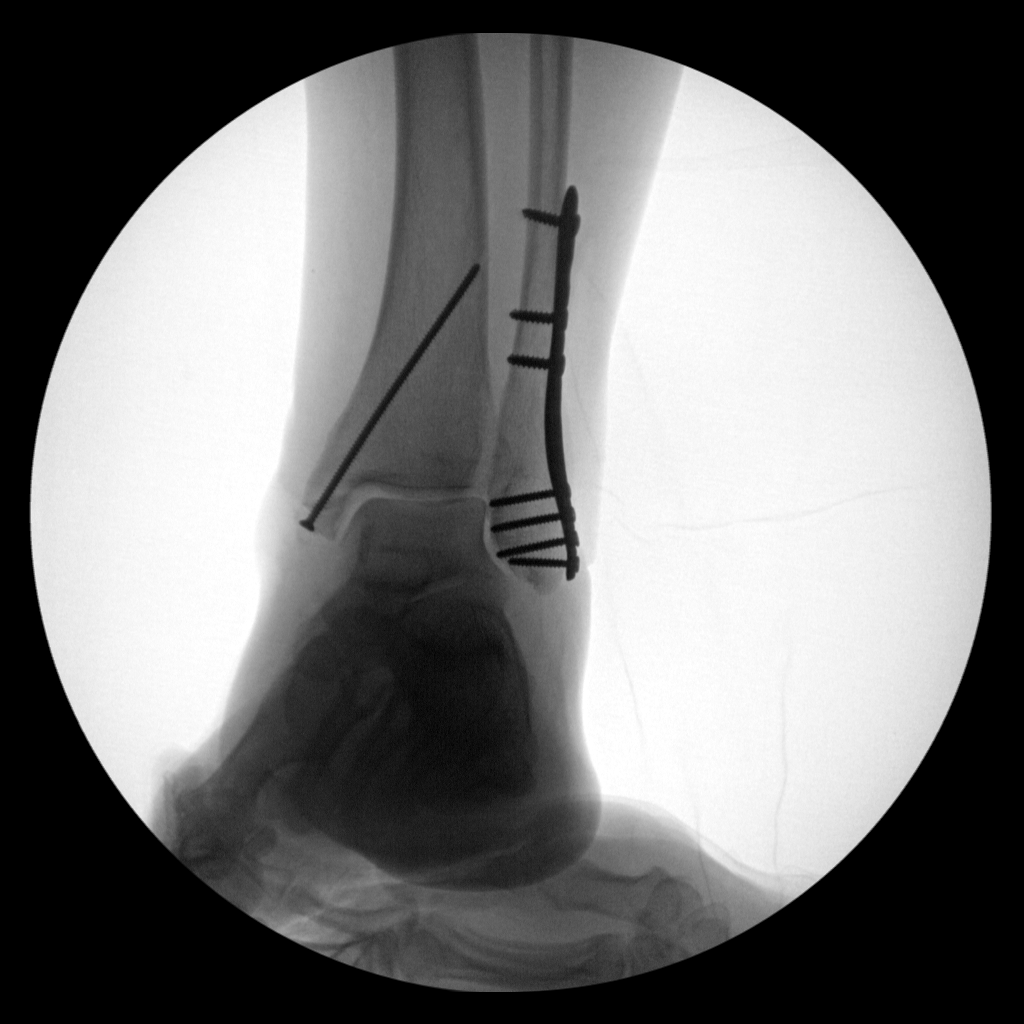
[im 4/5]
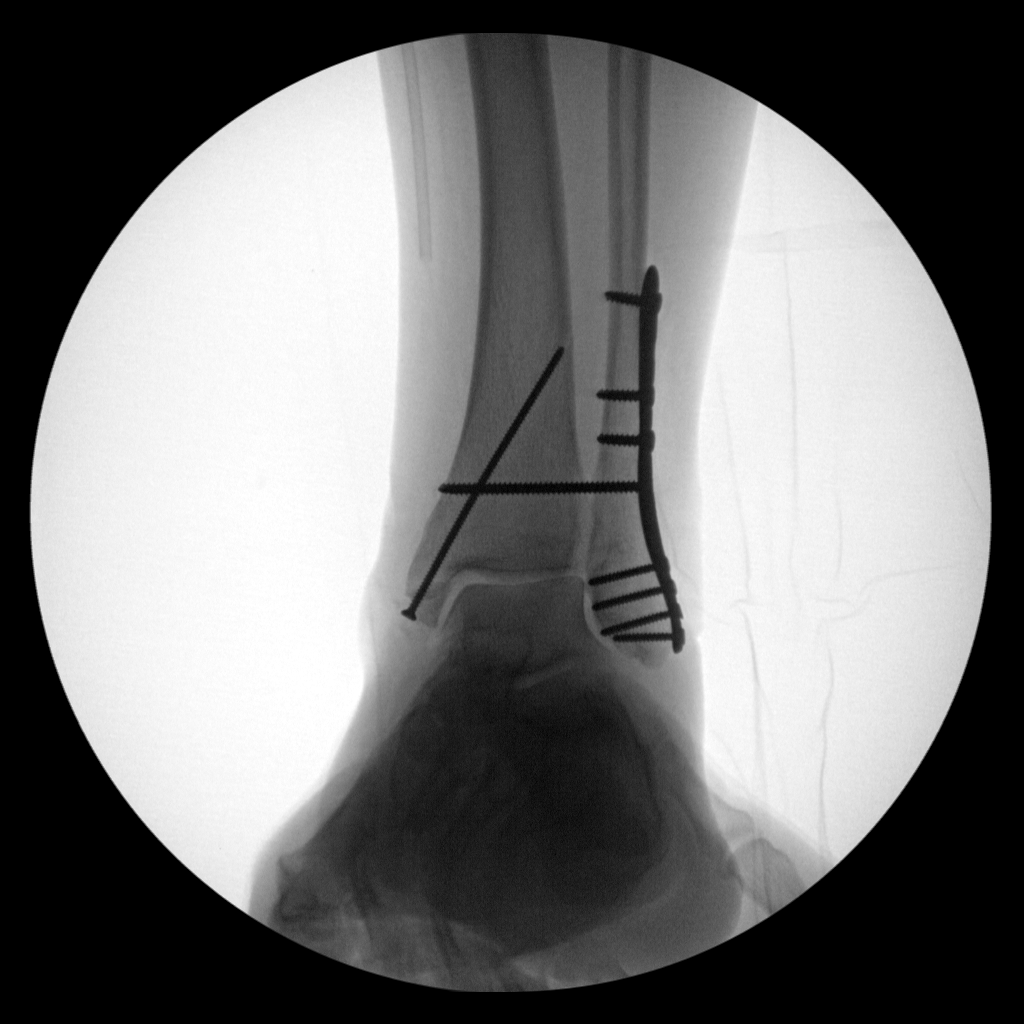
[im 5/5]
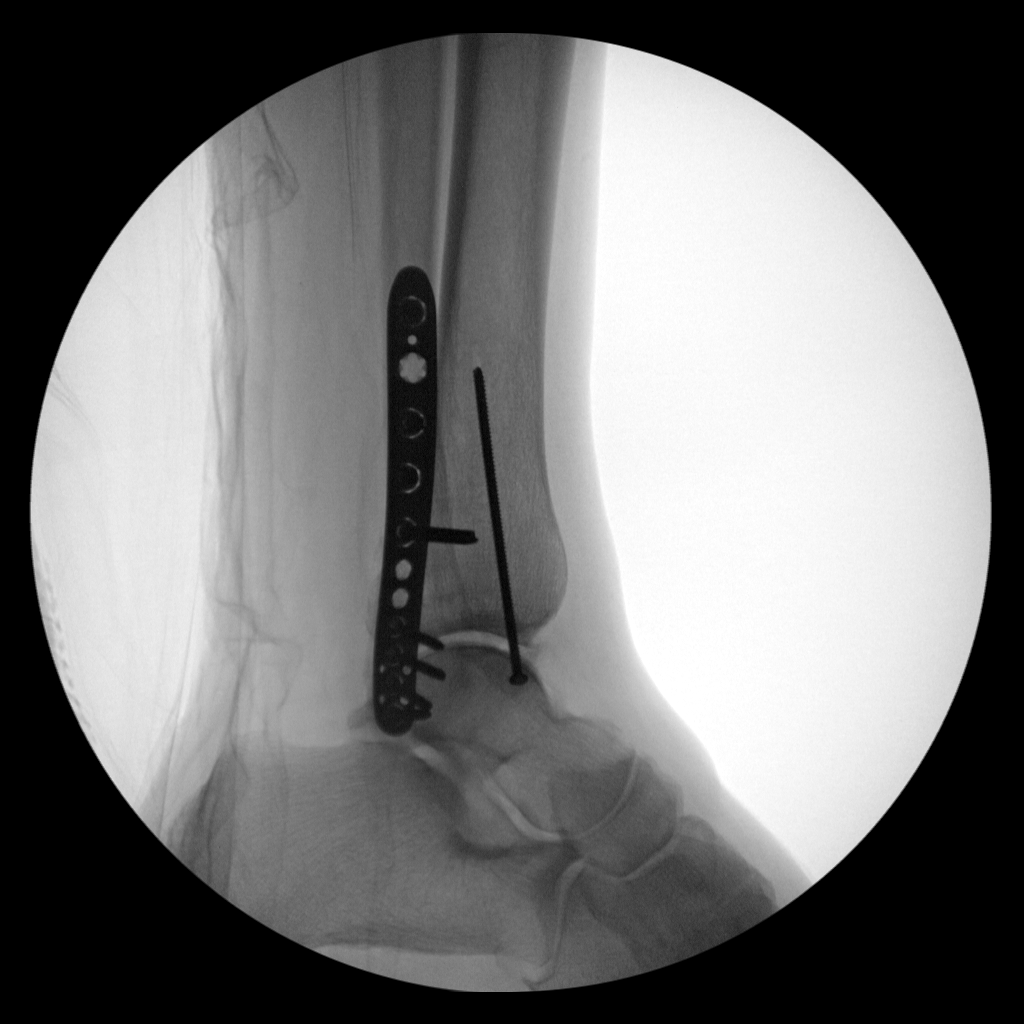

[5 of 5 positions shown; findings below may reference images not displayed]

FINDINGS: ORIF medial and lateral malleoli. Hardware intact. Anatomic
alignment.
IMPRESSION: ORIF medial and lateral malleoli.  Anatomic alignment.

## 2020-05-05 DIAGNOSIS — L820 Inflamed seborrheic keratosis: Secondary | ICD-10-CM | POA: Diagnosis not present

## 2020-06-04 DIAGNOSIS — Z23 Encounter for immunization: Secondary | ICD-10-CM | POA: Diagnosis not present

## 2020-06-04 DIAGNOSIS — K760 Fatty (change of) liver, not elsewhere classified: Secondary | ICD-10-CM | POA: Diagnosis not present

## 2020-06-04 DIAGNOSIS — E039 Hypothyroidism, unspecified: Secondary | ICD-10-CM | POA: Diagnosis not present

## 2020-06-04 DIAGNOSIS — E10319 Type 1 diabetes mellitus with unspecified diabetic retinopathy without macular edema: Secondary | ICD-10-CM | POA: Diagnosis not present

## 2020-06-04 DIAGNOSIS — E785 Hyperlipidemia, unspecified: Secondary | ICD-10-CM | POA: Diagnosis not present

## 2020-06-04 DIAGNOSIS — I10 Essential (primary) hypertension: Secondary | ICD-10-CM | POA: Diagnosis not present

## 2020-06-04 DIAGNOSIS — E042 Nontoxic multinodular goiter: Secondary | ICD-10-CM | POA: Diagnosis not present

## 2020-06-04 DIAGNOSIS — E559 Vitamin D deficiency, unspecified: Secondary | ICD-10-CM | POA: Diagnosis not present

## 2020-06-10 DIAGNOSIS — Z23 Encounter for immunization: Secondary | ICD-10-CM | POA: Diagnosis not present

## 2020-10-08 DIAGNOSIS — E10319 Type 1 diabetes mellitus with unspecified diabetic retinopathy without macular edema: Secondary | ICD-10-CM | POA: Diagnosis not present

## 2020-10-08 DIAGNOSIS — K760 Fatty (change of) liver, not elsewhere classified: Secondary | ICD-10-CM | POA: Diagnosis not present

## 2020-10-08 DIAGNOSIS — I10 Essential (primary) hypertension: Secondary | ICD-10-CM | POA: Diagnosis not present

## 2020-10-08 DIAGNOSIS — E559 Vitamin D deficiency, unspecified: Secondary | ICD-10-CM | POA: Diagnosis not present

## 2020-10-08 DIAGNOSIS — E042 Nontoxic multinodular goiter: Secondary | ICD-10-CM | POA: Diagnosis not present

## 2020-10-08 DIAGNOSIS — E039 Hypothyroidism, unspecified: Secondary | ICD-10-CM | POA: Diagnosis not present

## 2020-10-08 DIAGNOSIS — E785 Hyperlipidemia, unspecified: Secondary | ICD-10-CM | POA: Diagnosis not present

## 2020-11-05 DIAGNOSIS — K219 Gastro-esophageal reflux disease without esophagitis: Secondary | ICD-10-CM | POA: Diagnosis not present

## 2020-11-06 DIAGNOSIS — H40013 Open angle with borderline findings, low risk, bilateral: Secondary | ICD-10-CM | POA: Diagnosis not present

## 2020-11-06 DIAGNOSIS — H35373 Puckering of macula, bilateral: Secondary | ICD-10-CM | POA: Diagnosis not present

## 2020-11-06 DIAGNOSIS — E103293 Type 1 diabetes mellitus with mild nonproliferative diabetic retinopathy without macular edema, bilateral: Secondary | ICD-10-CM | POA: Diagnosis not present

## 2020-11-06 DIAGNOSIS — H04123 Dry eye syndrome of bilateral lacrimal glands: Secondary | ICD-10-CM | POA: Diagnosis not present

## 2020-11-24 DIAGNOSIS — U071 COVID-19: Secondary | ICD-10-CM | POA: Diagnosis not present

## 2020-12-31 ENCOUNTER — Ambulatory Visit (INDEPENDENT_AMBULATORY_CARE_PROVIDER_SITE_OTHER): Payer: Medicare Other | Admitting: Ophthalmology

## 2020-12-31 ENCOUNTER — Encounter (INDEPENDENT_AMBULATORY_CARE_PROVIDER_SITE_OTHER): Payer: Self-pay | Admitting: Ophthalmology

## 2020-12-31 ENCOUNTER — Other Ambulatory Visit: Payer: Self-pay

## 2020-12-31 DIAGNOSIS — H35033 Hypertensive retinopathy, bilateral: Secondary | ICD-10-CM | POA: Diagnosis not present

## 2020-12-31 DIAGNOSIS — Z961 Presence of intraocular lens: Secondary | ICD-10-CM | POA: Diagnosis not present

## 2020-12-31 DIAGNOSIS — I10 Essential (primary) hypertension: Secondary | ICD-10-CM

## 2020-12-31 DIAGNOSIS — H4312 Vitreous hemorrhage, left eye: Secondary | ICD-10-CM | POA: Diagnosis not present

## 2020-12-31 DIAGNOSIS — H3581 Retinal edema: Secondary | ICD-10-CM | POA: Diagnosis not present

## 2020-12-31 DIAGNOSIS — E113393 Type 2 diabetes mellitus with moderate nonproliferative diabetic retinopathy without macular edema, bilateral: Secondary | ICD-10-CM

## 2020-12-31 MED ORDER — BEVACIZUMAB CHEMO INJECTION 1.25MG/0.05ML SYRINGE FOR KALEIDOSCOPE
1.2500 mg | INTRAVITREAL | Status: AC | PRN
  Administered 2020-12-31: 15:00:00 1.25 mg via INTRAVITREAL

## 2020-12-31 NOTE — Progress Notes (Signed)
[facility] Note  12/31/2020     CHIEF COMPLAINT Patient presents for Retina Evaluation   HISTORY OF PRESENT ILLNESS: Jamie Mays is a 70 y.o. female who presents to the clinic today for:   HPI     Retina Evaluation   In left eye.  This started 9 hours ago.  Duration of 9 hours.  Associated Symptoms Floaters and Distortion.  Context:  distance vision, mid-range vision and near vision.  Treatments tried include no treatments.  I, the attending physician,  performed the HPI with the patient and updated documentation appropriately.        Comments   70 y/o female pt referred by Dr. Herbert Deaner this a.m. for eval of VH OS.  Pt woke up this a.m. around 4 a.m. and noticed extremely blurred VA OS.  No obvious cause.  Like "an extremely dirty window."  Pt saw Dr. Herbert Deaner this morning, who referred pt here.  VA good OD cc.  Denies pain, FOL.  Has some floaters OS.  Restasis BID OU.  BS 82 this a.m.  A1C 6.0 March 2022.      Last edited by Bernarda Caffey, MD on 12/31/2020  1:34 PM.    Pt is here on the referral of Dr. Herbert Deaner for concern of VH OS, pt states she woke up this morning with decreased VA in her left eye, she states yesterday her vision was fine, she worked in her garden and  babysat her granddaughter, pt states she has been diabetic for 41 years, but has never seen a retina specialist, she is on an insulin pump and her last A1c was 6, she states she has been taking a lot of ibuprofen for ankle pain, pt states she has seen a few fol temporally  Referring physician: Monna Fam, MD [place],  [place] [address]  HISTORICAL INFORMATION:   Selected notes from the MEDICAL RECORD NUMBER Referred by Dr. Herbert Deaner for acute [facility] OS LEE:  Ocular Hx- PMH-    CURRENT MEDICATIONS: Current Outpatient Medications (Ophthalmic Drugs)  Medication Sig   cycloSPORINE (RESTASIS) 0.05 % ophthalmic emulsion Place 1 drop into both eyes 2 (two) times  daily.   No current facility-administered medications for this visit. (Ophthalmic Drugs)   Current Outpatient Medications (Other)  Medication Sig   acetaminophen (TYLENOL) 500 MG tablet Take 1,000 mg by mouth every 8 (eight) hours as needed for moderate pain.   ALPRAZolam (XANAX) 0.5 MG tablet take one by mouth prn anxiety   calcium carbonate (TUMS - DOSED IN MG ELEMENTAL CALCIUM) 500 MG chewable tablet 1 tablet   esomeprazole (NEXIUM) 20 MG capsule 1 capsule   glucose blood test strip 1 each by Other route 6 (six) times daily. Use as instructed    hydrochlorothiazide (MICROZIDE) 12.5 MG capsule Take 12.5 mg by mouth daily.   ibuprofen (ADVIL) 200 MG tablet Take 200-800 mg by mouth every 6 (six) hours as needed for moderate pain.   insulin aspart (NOVOLOG) 100 UNIT/ML injection Inject 75-100 Units into the skin daily. Via insulin pump as directed   irbesartan (AVAPRO) 150 MG tablet Take 150 mg by mouth daily.   levothyroxine (SYNTHROID, LEVOTHROID) 75 MCG tablet Take 1 tablet (75 mcg total) by mouth daily. (Patient taking differently: Take 75 mcg by mouth See admin instructions. Take 75 mg on Mon, Tue, Thurs, Fri, and Sat. Skip Sun and Wed dose)   Magnesium 200 MG CHEW Chew 300 mg by mouth  daily.   minocycline (MINOCIN) 100 MG capsule Take 100 mg by mouth daily.   Omega-3 Fatty Acids (FISH OIL ADULT GUMMIES PO) Take 1 capsule by mouth daily.    propranolol (INDERAL) 20 MG tablet TAKE 1 TABLET (20 MG TOTAL) BY MOUTH 3 (THREE) TIMES DAILY AS NEEDED. (Patient taking differently: Take 10-20 mg by mouth 3 (three) times daily as needed (palpitations).)   Turmeric 500 MG CAPS Take 500 mg by mouth daily.   Vitamin D, Ergocalciferol, (DRISDOL) 1.25 MG (50000 UT) CAPS capsule Take 50,000 Units by mouth every Tuesday.    No current facility-administered medications for this visit. (Other)      REVIEW OF SYSTEMS: ROS   Positive for: Musculoskeletal, Endocrine, Eyes Negative for: Constitutional,  Gastrointestinal, Neurological, Skin, Genitourinary, HENT, Cardiovascular, Respiratory, Psychiatric, Allergic/Imm, Heme/Lymph Last edited by Matthew Folks, COA on 12/31/2020  1:10 PM.       ALLERGIES Allergies  Allergen Reactions   Doxycycline Other (See Comments)    Joint pain   Lisinopril Cough   Oxycodone     Hallucinations     PAST MEDICAL HISTORY Past Medical History:  Diagnosis Date   Ankle fracture    left   Arthritis    Diabetes mellitus    Family history of adverse reaction to anesthesia    " they had difficulty waking my dad up; he was about 37-34 years old."   Family history of MI (myocardial infarction)    GERD (gastroesophageal reflux disease)    Hypertension    Hypothyroidism    Painful orthopaedic hardware ([place])    PVC (premature ventricular contraction)    Smoker    former  " quit about 40 years ago"   Syncope    Wears glasses    Past Surgical History:  Procedure Laterality Date   CARDIAC CATHETERIZATION  2007   neg   CATARACT EXTRACTION     CATARACT EXTRACTION W/ INTRAOCULAR LENS  IMPLANT, BILATERAL     CESAREAN SECTION     EYE SURGERY     HARDWARE REMOVAL Left 03/19/2020   Procedure: HARDWARE REMOVAL LEFT ANKLE;  Surgeon: Shona Needles, MD;  Location: [facility];  Service: Orthopedics;  Laterality: Left;   ORIF ANKLE FRACTURE Left 07/19/2019   Procedure: OPEN REDUCTION INTERNAL FIXATION (ORIF) ANKLE FRACTURE;  Surgeon: Shona Needles, MD;  Location: [facility];  Service: Orthopedics;  Laterality: Left;    FAMILY HISTORY Family History  Problem Relation Age of Onset   Coronary artery disease Mother    Kidney disease Mother    Diabetes Mother    Heart disease Mother        heart attack   Hypertension Mother    Macular degeneration Father    Diabetes Sister    Diabetes Brother    Cancer Brother        lung cancer   Cancer Brother 94       lungs, brain, chest wall, liver cancer, stage 4   Diabetes Maternal Grandmother    Cancer Other 86        lung , brain    SOCIAL HISTORY Social History   Tobacco Use   Smoking status: Former    Pack years: 0.00    Types: Cigarettes    Quit date: 07/12/1981    Years since quitting: 39.4   Smokeless tobacco: Never  Vaping Use   Vaping Use: Never used  Substance Use Topics   Alcohol use: Not Currently    Alcohol/week: 0.0  standard drinks    Comment: very rare   Drug use: No         OPHTHALMIC EXAM:  Base Eye Exam     Visual Acuity (Snellen - Linear)       Right Left   Dist cc 20/20 -2 CF @ 2'   Dist ph cc  NI    Correction: Glasses         Tonometry (Tonopen, 1:15 PM)       Right Left   Pressure 14 15         Pupils       Dark Light Shape React APD   Right 3 2 Round Brisk None   Left 6 6 Round Minimal None  Pharm dil OS        Visual Fields (Counting fingers)       Left Right     Full   Restrictions Partial outer inferior temporal, superior nasal deficiencies          Extraocular Movement       Right Left    Full, Ortho Full, Ortho         Neuro/Psych     Oriented x3: Yes   Mood/Affect: Normal         Dilation     Both eyes: 1.0% Mydriacyl, 2.5% Phenylephrine @ 1:15 PM           Slit Lamp and Fundus Exam     Slit Lamp Exam       Right Left   Lids/Lashes Dermatochalasis - upper lid Dermatochalasis - upper lid   Conjunctiva/Sclera White and quiet temporal pinguecula   Cornea mild arcus, EBMD mild arcus, EBMD   Anterior Chamber deep, clear, narrow temporal angle deep, clear, narrow temporal angle   Iris Round and dilated, No NVI Round and dilated, No NVI   Lens PC IOL in good position PC IOL in good position   Vitreous Vitreous syneresis, Posterior vitreous detachment Vitreous syneresis, PVD, blood stained vitreous condensations centrally         Fundus Exam       Right Left   Disc Pink and Sharp no details visible   C/D Ratio 0.3 0.3   Macula Flat, Good foveal reflex, RPE mottling, No heme or edema partially  obscured   Vessels mild attenuation, mild tortuousity mild attenuation, mild tortuousity   Periphery Attached, rare MA and DBH    blood clots nasal and temporal, pre-retinal heme settled inferiorly           Refraction     Wearing Rx       Sphere Cylinder Axis Add   Right -0.50 +0.50 153 +2.50   Left -0.25 +1.50 160 +2.50    Age: 17yr   Type: PAL         Manifest Refraction       Sphere Cylinder Axis Dist VA   Right -0.75 +0.75 160 20/20-   Left -0.25 +1.50 160 CF @ 2'            IMAGING AND PROCEDURES  Imaging and Procedures for 12/31/2020  OCT, Retina - OU - Both Eyes       Right Eye Quality was good. Central Foveal Thickness: 280. Progression has no prior data. Findings include normal foveal contour, no IRF, no SRF.   Left Eye Quality was poor. Progression has no prior data. Findings include (Fovea and superior macula obscured by vitreous opacities, inferior macula attached).   Notes *Images captured  and stored on drive  Diagnosis / Impression:  OD: NFP, no IRF/SRF OS: Fovea and superior macula obscured by vitreous opacities, inferior macula attached  Clinical management:  See below  Abbreviations: NFP - Normal foveal profile. CME - cystoid macular edema. PED - pigment epithelial detachment. IRF - intraretinal fluid. SRF - subretinal fluid. EZ - ellipsoid zone. ERM - epiretinal membrane. ORA - outer retinal atrophy. ORT - outer retinal tubulation. SRHM - subretinal hyper-reflective material. IRHM - intraretinal hyper-reflective material      Color Fundus Photography Optos - OU - Both Eyes       Right Eye Progression has no prior data. Disc findings include normal observations. Macula : normal observations. Vessels : attenuated. Periphery : normal observations.   Left Eye Progression has no prior data. Disc findings include (No view). Macula : hemorrhage (Partially obscured). Vessels : attenuated. Periphery : hemorrhage (Scattered vitreous  hemorrhage).   Notes *Images captured and stored on drive  Diagnosis / Impression:  OD: normal study OS: scattered VH   Clinical management:  See below  Abbreviations: NFP - Normal foveal profile. CME - cystoid macular edema. PED - pigment epithelial detachment. IRF - intraretinal fluid. SRF - subretinal fluid. EZ - ellipsoid zone. ERM - epiretinal membrane. ORA - outer retinal atrophy. ORT - outer retinal tubulation. SRHM - subretinal hyper-reflective material. IRHM - intraretinal hyper-reflective material      Fluorescein Angiography Optos (Transit OS)       Right Eye Progression has no prior data. Early phase findings include microaneurysm. Mid/Late phase findings include microaneurysm, leakage (Mild leakage from scatterd MA's).   Left Eye Progression has no prior data. Early phase findings include blockage, vascular perfusion defect, microaneurysm. Mid/Late phase findings include blockage, microaneurysm (No leakage, No NV).   Notes **Images stored on drive**  Impression: Moderate NPDR OU Late leaking MA's OU OS - significant blockage from [facility], but no obvious NV      Intravitreal Injection, Pharmacologic Agent - OS - Left Eye       Time Out 12/31/2020. 2:54 PM. Confirmed correct patient, procedure, site, and patient consented.   Anesthesia Topical anesthesia was used. Anesthetic medications included Lidocaine 2%, Proparacaine 0.5%.   Procedure Preparation included 5% betadine to ocular surface, eyelid speculum. A supplied needle was used.   Injection: 1.25 mg Bevacizumab 1.25mg /0.69ml   Route: Intravitreal, Site: Left Eye   NDC: H061816, Lot: [id number] , Expiration date: 02/18/2021, Waste: 3.95 mL   Post-op Post injection exam found visual acuity of at least counting fingers. The patient tolerated the procedure well. There were no complications. The patient received written and verbal post procedure care education. Post injection medications were not given.               ASSESSMENT/PLAN:    ICD-10-CM   1. Vitreous hemorrhage of left eye (HCC)  H43.12 Color Fundus Photography Optos - OU - Both Eyes    Intravitreal Injection, Pharmacologic Agent - OS - Left Eye    Bevacizumab (AVASTIN) SOLN 1.25 mg    2. Retinal edema  H35.81 OCT, Retina - OU - Both Eyes    3. Moderate nonproliferative diabetic retinopathy of both eyes without macular edema associated with type 2 diabetes mellitus (HCC)  K93.2671 Intravitreal Injection, Pharmacologic Agent - OS - Left Eye    Bevacizumab (AVASTIN) SOLN 1.25 mg    4. Essential hypertension  I10     5. Hypertensive retinopathy of both eyes  H35.033 Fluorescein Angiography Optos (Transit OS)  6. Pseudophakia, both eyes  Z96.1       1,2. Vitreous Hemorrhage OS  - onset overnight and noticed this morning upon waking (6.22.22)  - unclear etiology -- hemorrhagic PVD v diabetic hemorrhage  - no RT/RD on 360 peripheral examination  - recommend IVA OS #1 today, 06.22.22 for [facility]  - pt wishes to proceed  - RBA of procedure discussed, questions answered - informed consent obtained and signed - see procedure note - VH precautions reviewed -- minimize activities, keep head elevated, avoid ASA/NSAIDs/blood thinners as able - f/u 1 wk -- DFE/OCT  3. Moderate nonproliferative diabetic retinopathy w/o DME, both eyes  - OS:  with dense VH - The incidence, risk factors for progression, natural history and treatment options for diabetic retinopathy were discussed with patient.   - The need for close monitoring of blood glucose, blood pressure, and serum lipids, avoiding cigarette or any type of tobacco, and the need for long term follow up was also discussed with patient. - FA 06.22.22 w/ scattered leaking MA -- no obvious NV, but OS with significant blockage from [facility] - OCT without diabetic macular edema, both eyes  - monitor  4,5. Hypertensive retinopathy OU - discussed importance of tight BP control -  monitor  6. Pseudophakia OU  - s/p CE/IOL OU  - IOLs in good position, doing well  - monitor    Ophthalmic Meds Ordered this visit:  Meds ordered this encounter  Medications   Bevacizumab (AVASTIN) SOLN 1.25 mg        Return in about 1 week (around 01/07/2021) for f/u VH OS, DFE, OCT.  There are no Patient Instructions on file for this visit.   Explained the diagnoses, plan, and follow up with the patient and they expressed understanding.  Patient expressed understanding of the importance of proper follow up care.   This document serves as a record of services personally performed by Gardiner Sleeper, MD, PhD. It was created on their behalf by San Jetty. Owens Shark, OA an ophthalmic technician. The creation of this record is the provider's dictation and/or activities during the visit.    Electronically signed by: San Jetty. Owens Shark, [place] 06.22.2022 11:04 PM   Gardiner Sleeper, M.D., Ph.D. Diseases & Surgery of the Retina and Vitreous [facility]  I have reviewed the above documentation for accuracy and completeness, and I agree with the above. Gardiner Sleeper, M.D., Ph.D. 12/31/20 11:04 PM   Abbreviations: M myopia (nearsighted); A astigmatism; H hyperopia (farsighted); P presbyopia; Mrx spectacle prescription;  CTL contact lenses; OD right eye; OS left eye; OU both eyes  XT exotropia; ET esotropia; PEK punctate epithelial keratitis; PEE punctate epithelial erosions; DES dry eye syndrome; MGD meibomian gland dysfunction; ATs artificial tears; PFAT's preservative free artificial tears; [place] nuclear sclerotic cataract; PSC posterior subcapsular cataract; ERM epi-retinal membrane; PVD posterior vitreous detachment; RD retinal detachment; DM diabetes mellitus; DR diabetic retinopathy; NPDR non-proliferative diabetic retinopathy; PDR proliferative diabetic retinopathy; CSME clinically significant macular edema; DME diabetic macular edema; dbh dot blot hemorrhages; CWS cotton  wool spot; POAG primary open angle glaucoma; C/D cup-to-disc ratio; HVF humphrey visual field; GVF goldmann visual field; OCT optical coherence tomography; IOP intraocular pressure; BRVO Branch retinal vein occlusion; CRVO central retinal vein occlusion; CRAO central retinal artery occlusion; BRAO branch retinal artery occlusion; RT retinal tear; SB scleral buckle; PPV pars plana vitrectomy; VH Vitreous hemorrhage; PRP panretinal laser photocoagulation; IVK intravitreal kenalog; VMT vitreomacular traction; MH Macular hole;  NVD neovascularization of the disc; NVE neovascularization elsewhere; AREDS age related eye disease study; ARMD age related macular degeneration; POAG primary open angle glaucoma; EBMD epithelial/anterior basement membrane dystrophy; ACIOL anterior chamber intraocular lens; IOL intraocular lens; PCIOL posterior chamber intraocular lens; Phaco/IOL phacoemulsification with intraocular lens placement; [place] photorefractive keratectomy; LASIK laser assisted in situ keratomileusis; HTN hypertension; DM diabetes mellitus; COPD chronic obstructive pulmonary disease

## 2021-01-06 NOTE — Progress Notes (Signed)
[facility] Note  01/07/2021     CHIEF COMPLAINT Patient presents for Retina Follow Up   HISTORY OF PRESENT ILLNESS: Jamie Mays is a 70 y.o. female who presents to the clinic today for:   HPI     Retina Follow Up   Patient presents with  Other.  In left eye.  Duration of 1 week.  Since onset it is gradually improving.  I, the attending physician,  performed the HPI with the patient and updated documentation appropriately.        Comments   Pt here for 1 week retinal follow VH OS. Pt reports not much improvement w/ vision but does think things are a bit brighter, more light is getting through. No ocular pain or discomfort reported.       Last edited by Bernarda Caffey, MD on 01/07/2021  5:06 PM.    Pt feels like her vision is slightly improved in the left eye, she states a little more light is coming in, no problems with injection last week, no fol, she states she is trying to keep her head as still as possible and sleeping with her head upright  Referring physician: Reynold Bowen, MD [place],  [place] [address]  HISTORICAL INFORMATION:   Selected notes from the [facility] Referred by Dr. Herbert Deaner for acute [facility] OS LEE:  Ocular Hx- PMH-    CURRENT MEDICATIONS: Current Outpatient Medications (Ophthalmic Drugs)  Medication Sig   cycloSPORINE (RESTASIS) 0.05 % ophthalmic emulsion Place 1 drop into both eyes 2 (two) times daily.   No current facility-administered medications for this visit. (Ophthalmic Drugs)   Current Outpatient Medications (Other)  Medication Sig   acetaminophen (TYLENOL) 500 MG tablet Take 1,000 mg by mouth every 8 (eight) hours as needed for moderate pain.   ALPRAZolam (XANAX) 0.5 MG tablet take one by mouth prn anxiety   calcium carbonate (TUMS - DOSED IN MG ELEMENTAL CALCIUM) 500 MG chewable tablet 1 tablet   esomeprazole (NEXIUM) 20 MG capsule 1 capsule   glucose blood test strip 1 each by  Other route 6 (six) times daily. Use as instructed    hydrochlorothiazide (MICROZIDE) 12.5 MG capsule Take 12.5 mg by mouth daily.   ibuprofen (ADVIL) 200 MG tablet Take 200-800 mg by mouth every 6 (six) hours as needed for moderate pain.   insulin aspart (NOVOLOG) 100 UNIT/ML injection Inject 75-100 Units into the skin daily. Via insulin pump as directed   irbesartan (AVAPRO) 150 MG tablet Take 150 mg by mouth daily.   levothyroxine (SYNTHROID, LEVOTHROID) 75 MCG tablet Take 1 tablet (75 mcg total) by mouth daily. (Patient taking differently: Take 75 mcg by mouth See admin instructions. Take 75 mg on Mon, Tue, Thurs, Fri, and Sat. Skip Sun and Wed dose)   Magnesium 200 MG CHEW Chew 300 mg by mouth daily.   minocycline (MINOCIN) 100 MG capsule Take 100 mg by mouth daily.   Omega-3 Fatty Acids (FISH OIL ADULT GUMMIES PO) Take 1 capsule by mouth daily.    propranolol (INDERAL) 20 MG tablet TAKE 1 TABLET (20 MG TOTAL) BY MOUTH 3 (THREE) TIMES DAILY AS NEEDED. (Patient taking differently: Take 10-20 mg by mouth 3 (three) times daily as needed (palpitations).)   Turmeric 500 MG CAPS Take 500 mg by mouth daily.   Vitamin D, Ergocalciferol, (DRISDOL) 1.25 MG (50000 UT) CAPS capsule Take 50,000 Units by mouth every Tuesday.    No current facility-administered  medications for this visit. (Other)      REVIEW OF SYSTEMS: ROS   Positive for: Musculoskeletal, Endocrine, Eyes Negative for: Constitutional, Gastrointestinal, Neurological, Skin, Genitourinary, HENT, Cardiovascular, Respiratory, Psychiatric, Allergic/Imm, Heme/Lymph Last edited by Kingsley Spittle, COT on 01/07/2021  1:13 PM.        ALLERGIES Allergies  Allergen Reactions   Doxycycline Other (See Comments)    Joint pain   Lisinopril Cough   Oxycodone     Hallucinations     PAST MEDICAL HISTORY Past Medical History:  Diagnosis Date   Ankle fracture    left   Arthritis    Diabetes mellitus    Family history of adverse  reaction to anesthesia    " they had difficulty waking my dad up; he was about 61-57 years old."   Family history of MI (myocardial infarction)    GERD (gastroesophageal reflux disease)    Hypertension    Hypothyroidism    Painful orthopaedic hardware ([place])    PVC (premature ventricular contraction)    Smoker    former  " quit about 40 years ago"   Syncope    Wears glasses    Past Surgical History:  Procedure Laterality Date   CARDIAC CATHETERIZATION  2007   neg   CATARACT EXTRACTION     CATARACT EXTRACTION W/ INTRAOCULAR LENS  IMPLANT, BILATERAL     CESAREAN SECTION     EYE SURGERY     HARDWARE REMOVAL Left 03/19/2020   Procedure: HARDWARE REMOVAL LEFT ANKLE;  Surgeon: Shona Needles, MD;  Location: [facility];  Service: Orthopedics;  Laterality: Left;   ORIF ANKLE FRACTURE Left 07/19/2019   Procedure: OPEN REDUCTION INTERNAL FIXATION (ORIF) ANKLE FRACTURE;  Surgeon: Shona Needles, MD;  Location: [facility];  Service: Orthopedics;  Laterality: Left;    FAMILY HISTORY Family History  Problem Relation Age of Onset   Coronary artery disease Mother    Kidney disease Mother    Diabetes Mother    Heart disease Mother        heart attack   Hypertension Mother    Macular degeneration Father    Diabetes Sister    Diabetes Brother    Cancer Brother        lung cancer   Cancer Brother 77       lungs, brain, chest wall, liver cancer, stage 4   Diabetes Maternal Grandmother    Cancer Other 60       lung , brain    SOCIAL HISTORY Social History   Tobacco Use   Smoking status: Former    Pack years: 0.00    Types: Cigarettes    Quit date: 07/12/1981    Years since quitting: 39.5   Smokeless tobacco: Never  Vaping Use   Vaping Use: Never used  Substance Use Topics   Alcohol use: Not Currently    Alcohol/week: 0.0 standard drinks    Comment: very rare   Drug use: No         OPHTHALMIC EXAM:  Base Eye Exam     Visual Acuity (Snellen - Linear)       Right Left   Dist  cc 20/25 CF at 3'    Correction: Glasses         Tonometry (Tonopen, 1:20 PM)       Right Left   Pressure 17 14         Pupils       Dark Light Shape React APD  Right 4 3 Round Brisk None   Left 4 3 Round Brisk None         Visual Fields (Counting fingers)       Left Right     Full   Restrictions Partial outer inferior temporal, superior nasal deficiencies          Extraocular Movement       Right Left    Full, Ortho Full, Ortho         Neuro/Psych     Oriented x3: Yes   Mood/Affect: Normal         Dilation     Left eye: 1.0% Mydriacyl, 2.5% Phenylephrine @ 1:20 PM           Slit Lamp and Fundus Exam     Slit Lamp Exam       Right Left   Lids/Lashes Dermatochalasis - upper lid Dermatochalasis - upper lid   Conjunctiva/Sclera White and quiet temporal pinguecula   Cornea mild arcus, EBMD mild arcus, EBMD, trace PEE   Anterior Chamber deep, clear, narrow temporal angle deep, clear, narrow temporal angle   Iris Round and dilated, No NVI Round and dilated, No NVI   Lens PC IOL in good position PC IOL in good position   Vitreous Vitreous syneresis, Posterior vitreous detachment Vitreous syneresis, PVD, diffuse VH, blood stained vitreous condensations centrally         Fundus Exam       Right Left   Disc Pink and Sharp no details visible   C/D Ratio 0.3 0.3   Macula Flat, Good foveal reflex, RPE mottling, No heme or edema partially obscured   Vessels mild attenuation, mild tortuousity mild attenuation, mild tortuousity   Periphery Attached, rare MA and DBH    blood clots nasal and temporal, pre-retinal heme settled inferiorly           Refraction     Wearing Rx       Sphere Cylinder Axis Add   Right -0.50 +0.50 153 +2.50   Left -0.25 +1.50 160 +2.50    Type: PAL            IMAGING AND PROCEDURES  Imaging and Procedures for 01/07/2021  OCT, Retina - OU - Both Eyes       Right Eye Quality was good. Central Foveal  Thickness: 275. Progression has been stable. Findings include normal foveal contour, no IRF, no SRF.   Left Eye Quality was poor. Progression has no prior data. Findings include (No readable image).   Notes *Images captured and stored on drive  Diagnosis / Impression:  OD: NFP, no IRF/SRF OS: no readable image  Clinical management:  See below  Abbreviations: NFP - Normal foveal profile. CME - cystoid macular edema. PED - pigment epithelial detachment. IRF - intraretinal fluid. SRF - subretinal fluid. EZ - ellipsoid zone. ERM - epiretinal membrane. ORA - outer retinal atrophy. ORT - outer retinal tubulation. SRHM - subretinal hyper-reflective material. IRHM - intraretinal hyper-reflective material      B-Scan Ultrasound - OS - Left Eye       Quality was good. Findings included vitreous hemorrhage, vitreous opacities.   Notes **Images stored on drive**  Impression: OS: vitreous opacities consistent with hemorrhage; no obvious RT/RD or mass               ASSESSMENT/PLAN:    ICD-10-CM   1. Vitreous hemorrhage of left eye (HCC)  H43.12 B-Scan Ultrasound - OS - Left Eye  2. Retinal edema  H35.81 OCT, Retina - OU - Both Eyes    3. Moderate nonproliferative diabetic retinopathy of both eyes without macular edema associated with type 2 diabetes mellitus ([place])  X38.1829     4. Essential hypertension  I10     5. Hypertensive retinopathy of both eyes  H35.033     6. Pseudophakia, both eyes  Z96.1        1,2. Vitreous Hemorrhage OS  - s/p IVA OS #1 (06.22.22)  - onset overnight and noted upon waking (6.22.22)  - unclear etiology -- hemorrhagic PVD v diabetic hemorrhage  - no RT/RD on 360 peripheral examination  - b-scan 6.29.22 without obvious RT/RD or mass  - VH improving but still very diffuse  - BCVA remains CF - VH precautions reviewed -- minimize activities, keep head elevated, avoid ASA/NSAIDs/blood thinners as able - f/u 1 wk -- DFE/OCT  3. Moderate  nonproliferative diabetic retinopathy w/o DME, both eyes  - OS:  with dense VH - FA 06.22.22 w/ scattered leaking MA -- no obvious NV, but OS with significant blockage from [facility] - OCT without diabetic macular edema, both eyes  - monitor  4,5. Hypertensive retinopathy OU - discussed importance of tight BP control - monitor  6. Pseudophakia OU  - s/p CE/IOL OU  - IOLs in good position, doing well  - monitor    Ophthalmic Meds Ordered this visit:  No orders of the defined types were placed in this encounter.       Return in about 1 week (around 01/14/2021) for f/u VH OS, DFE, OCT.  There are no Patient Instructions on file for this visit.   Explained the diagnoses, plan, and follow up with the patient and they expressed understanding.  Patient expressed understanding of the importance of proper follow up care.   This document serves as a record of services personally performed by Gardiner Sleeper, MD, PhD. It was created on their behalf by Roselee Nova, COMT. The creation of this record is the provider's dictation and/or activities during the visit.  Electronically signed by: Roselee Nova, COMT 01/07/21 5:09 PM  This document serves as a record of services personally performed by Gardiner Sleeper, MD, PhD. It was created on their behalf by San Jetty. Owens Shark, OA an ophthalmic technician. The creation of this record is the provider's dictation and/or activities during the visit.    Electronically signed by: San Jetty. Owens Shark, [place] 06.29.2022 5:09 PM  Gardiner Sleeper, M.D., Ph.D. Diseases & Surgery of the Retina and Vitreous [facility]  I have reviewed the above documentation for accuracy and completeness, and I agree with the above. Gardiner Sleeper, M.D., Ph.D. 01/07/21 5:09 PM   Abbreviations: M myopia (nearsighted); A astigmatism; H hyperopia (farsighted); P presbyopia; Mrx spectacle prescription;  CTL contact lenses; OD right eye; OS left eye; OU both eyes  XT  exotropia; ET esotropia; PEK punctate epithelial keratitis; PEE punctate epithelial erosions; DES dry eye syndrome; MGD meibomian gland dysfunction; ATs artificial tears; PFAT's preservative free artificial tears; [place] nuclear sclerotic cataract; PSC posterior subcapsular cataract; ERM epi-retinal membrane; PVD posterior vitreous detachment; RD retinal detachment; DM diabetes mellitus; DR diabetic retinopathy; NPDR non-proliferative diabetic retinopathy; PDR proliferative diabetic retinopathy; CSME clinically significant macular edema; DME diabetic macular edema; dbh dot blot hemorrhages; CWS cotton wool spot; POAG primary open angle glaucoma; C/D cup-to-disc ratio; HVF humphrey visual field; GVF goldmann visual field; OCT optical coherence tomography; IOP intraocular pressure; BRVO  Branch retinal vein occlusion; CRVO central retinal vein occlusion; CRAO central retinal artery occlusion; BRAO branch retinal artery occlusion; RT retinal tear; SB scleral buckle; PPV pars plana vitrectomy; VH Vitreous hemorrhage; PRP panretinal laser photocoagulation; IVK intravitreal kenalog; VMT vitreomacular traction; MH Macular hole;  NVD neovascularization of the disc; NVE neovascularization elsewhere; AREDS age related eye disease study; ARMD age related macular degeneration; POAG primary open angle glaucoma; EBMD epithelial/anterior basement membrane dystrophy; ACIOL anterior chamber intraocular lens; IOL intraocular lens; PCIOL posterior chamber intraocular lens; Phaco/IOL phacoemulsification with intraocular lens placement; [place] photorefractive keratectomy; LASIK laser assisted in situ keratomileusis; HTN hypertension; DM diabetes mellitus; COPD chronic obstructive pulmonary disease

## 2021-01-07 ENCOUNTER — Ambulatory Visit (INDEPENDENT_AMBULATORY_CARE_PROVIDER_SITE_OTHER): Payer: Medicare Other | Admitting: Ophthalmology

## 2021-01-07 ENCOUNTER — Other Ambulatory Visit: Payer: Self-pay

## 2021-01-07 ENCOUNTER — Encounter (INDEPENDENT_AMBULATORY_CARE_PROVIDER_SITE_OTHER): Payer: Self-pay | Admitting: Ophthalmology

## 2021-01-07 DIAGNOSIS — K219 Gastro-esophageal reflux disease without esophagitis: Secondary | ICD-10-CM | POA: Diagnosis not present

## 2021-01-07 DIAGNOSIS — E10319 Type 1 diabetes mellitus with unspecified diabetic retinopathy without macular edema: Secondary | ICD-10-CM | POA: Diagnosis not present

## 2021-01-07 DIAGNOSIS — H4312 Vitreous hemorrhage, left eye: Secondary | ICD-10-CM | POA: Diagnosis not present

## 2021-01-07 DIAGNOSIS — I10 Essential (primary) hypertension: Secondary | ICD-10-CM | POA: Diagnosis not present

## 2021-01-07 DIAGNOSIS — E039 Hypothyroidism, unspecified: Secondary | ICD-10-CM | POA: Diagnosis not present

## 2021-01-07 DIAGNOSIS — H3581 Retinal edema: Secondary | ICD-10-CM

## 2021-01-07 DIAGNOSIS — Z961 Presence of intraocular lens: Secondary | ICD-10-CM

## 2021-01-07 DIAGNOSIS — E113393 Type 2 diabetes mellitus with moderate nonproliferative diabetic retinopathy without macular edema, bilateral: Secondary | ICD-10-CM | POA: Diagnosis not present

## 2021-01-07 DIAGNOSIS — E785 Hyperlipidemia, unspecified: Secondary | ICD-10-CM | POA: Diagnosis not present

## 2021-01-07 DIAGNOSIS — H35033 Hypertensive retinopathy, bilateral: Secondary | ICD-10-CM

## 2021-01-07 DIAGNOSIS — E559 Vitamin D deficiency, unspecified: Secondary | ICD-10-CM | POA: Diagnosis not present

## 2021-01-08 NOTE — Progress Notes (Signed)
[facility] Note  01/14/2021     CHIEF COMPLAINT Patient presents for Retina Follow Up   HISTORY OF PRESENT ILLNESS: Jamie Mays is a 70 y.o. female who presents to the clinic today for:   HPI     Retina Follow Up   Patient presents with  Other.  In left eye.  Duration of 1 week.  Since onset it is stable.  I, the attending physician,  performed the HPI with the patient and updated documentation appropriately.        Comments   Pt here for 1 wk ret f/u Vit Heme OS. Pt states vision is improving OS. BS was 86 this AM. Reports last A1C was 6.0. No ocular pain or discomfort noted.       Last edited by Bernarda Caffey, MD on 01/14/2021  9:00 AM.    Pt states vision is still improving, she has been sleeping in a recliner   Referring physician: Reynold Bowen, MD [place],  [place] [address]  HISTORICAL INFORMATION:   Selected notes from the [facility] Referred by Dr. Herbert Deaner for acute [facility] OS LEE:  Ocular Hx- PMH-    CURRENT MEDICATIONS: Current Outpatient Medications (Ophthalmic Drugs)  Medication Sig   cycloSPORINE (RESTASIS) 0.05 % ophthalmic emulsion Place 1 drop into both eyes 2 (two) times daily.   No current facility-administered medications for this visit. (Ophthalmic Drugs)   Current Outpatient Medications (Other)  Medication Sig   acetaminophen (TYLENOL) 500 MG tablet Take 1,000 mg by mouth every 8 (eight) hours as needed for moderate pain.   ALPRAZolam (XANAX) 0.5 MG tablet take one by mouth prn anxiety   calcium carbonate (TUMS - DOSED IN MG ELEMENTAL CALCIUM) 500 MG chewable tablet 1 tablet   esomeprazole (NEXIUM) 20 MG capsule 1 capsule   glucose blood test strip 1 each by Other route 6 (six) times daily. Use as instructed    hydrochlorothiazide (MICROZIDE) 12.5 MG capsule Take 12.5 mg by mouth daily.   ibuprofen (ADVIL) 200 MG tablet Take 200-800 mg by mouth every 6 (six) hours as needed for moderate  pain.   insulin aspart (NOVOLOG) 100 UNIT/ML injection Inject 75-100 Units into the skin daily. Via insulin pump as directed   irbesartan (AVAPRO) 150 MG tablet Take 150 mg by mouth daily.   levothyroxine (SYNTHROID, LEVOTHROID) 75 MCG tablet Take 1 tablet (75 mcg total) by mouth daily. (Patient taking differently: Take 75 mcg by mouth See admin instructions. Take 75 mg on Mon, Tue, Thurs, Fri, and Sat. Skip Sun and Wed dose)   Magnesium 200 MG CHEW Chew 300 mg by mouth daily.   minocycline (MINOCIN) 100 MG capsule Take 100 mg by mouth daily.   Omega-3 Fatty Acids (FISH OIL ADULT GUMMIES PO) Take 1 capsule by mouth daily.    propranolol (INDERAL) 20 MG tablet TAKE 1 TABLET (20 MG TOTAL) BY MOUTH 3 (THREE) TIMES DAILY AS NEEDED. (Patient taking differently: Take 10-20 mg by mouth 3 (three) times daily as needed (palpitations).)   Turmeric 500 MG CAPS Take 500 mg by mouth daily.   Vitamin D, Ergocalciferol, (DRISDOL) 1.25 MG (50000 UT) CAPS capsule Take 50,000 Units by mouth every Tuesday.    No current facility-administered medications for this visit. (Other)      REVIEW OF SYSTEMS: ROS   Positive for: Musculoskeletal, Endocrine, Eyes Negative for: Constitutional, Gastrointestinal, Neurological, Skin, Genitourinary, HENT, Cardiovascular, Respiratory, Psychiatric, Allergic/Imm, Heme/Lymph Last edited by Moshe Cipro,  Deatra James, COT on 01/14/2021  7:57 AM.         ALLERGIES Allergies  Allergen Reactions   Doxycycline Other (See Comments)    Joint pain   Lisinopril Cough   Oxycodone     Hallucinations     PAST MEDICAL HISTORY Past Medical History:  Diagnosis Date   Ankle fracture    left   Arthritis    Diabetes mellitus    Family history of adverse reaction to anesthesia    " they had difficulty waking my dad up; he was about 81-81 years old."   Family history of MI (myocardial infarction)    GERD (gastroesophageal reflux disease)    Hypertension    Hypothyroidism    Painful  orthopaedic hardware ([place])    PVC (premature ventricular contraction)    Smoker    former  " quit about 40 years ago"   Syncope    Wears glasses    Past Surgical History:  Procedure Laterality Date   CARDIAC CATHETERIZATION  2007   neg   CATARACT EXTRACTION     CATARACT EXTRACTION W/ INTRAOCULAR LENS  IMPLANT, BILATERAL     CESAREAN SECTION     EYE SURGERY     HARDWARE REMOVAL Left 03/19/2020   Procedure: HARDWARE REMOVAL LEFT ANKLE;  Surgeon: Shona Needles, MD;  Location: [place];  Service: Orthopedics;  Laterality: Left;   ORIF ANKLE FRACTURE Left 07/19/2019   Procedure: OPEN REDUCTION INTERNAL FIXATION (ORIF) ANKLE FRACTURE;  Surgeon: Shona Needles, MD;  Location: [place];  Service: Orthopedics;  Laterality: Left;    FAMILY HISTORY Family History  Problem Relation Age of Onset   Coronary artery disease Mother    Kidney disease Mother    Diabetes Mother    Heart disease Mother        heart attack   Hypertension Mother    Macular degeneration Father    Diabetes Sister    Diabetes Brother    Cancer Brother        lung cancer   Cancer Brother 37       lungs, brain, chest wall, liver cancer, stage 4   Diabetes Maternal Grandmother    Cancer Other 35       lung , brain    SOCIAL HISTORY Social History   Tobacco Use   Smoking status: Former    Pack years: 0.00    Types: Cigarettes    Quit date: 07/12/1981    Years since quitting: 39.5   Smokeless tobacco: Never  Vaping Use   Vaping Use: Never used  Substance Use Topics   Alcohol use: Not Currently    Alcohol/week: 0.0 standard drinks    Comment: very rare   Drug use: No         OPHTHALMIC EXAM:  Base Eye Exam     Visual Acuity (Snellen - Linear)       Right Left   Dist cc 20/25 -1 20/400 -1   Dist ph cc NI          Tonometry (Tonopen, 8:05 AM)       Right Left   Pressure 12 15         Pupils       Dark Light Shape React APD   Right 4 3 Round Brisk None   Left 4 3 Round Brisk None          Visual Fields (Counting fingers)       Left Right  Full   Restrictions Partial outer inferior temporal, superior nasal deficiencies          Extraocular Movement       Right Left    Full, Ortho Full, Ortho         Neuro/Psych     Oriented x3: Yes   Mood/Affect: Normal         Dilation     Both eyes: 1.0% Mydriacyl, 2.5% Phenylephrine @ 8:05 AM           Slit Lamp and Fundus Exam     Slit Lamp Exam       Right Left   Lids/Lashes Dermatochalasis - upper lid Dermatochalasis - upper lid   Conjunctiva/Sclera White and quiet temporal pinguecula   Cornea mild arcus, EBMD, 1-2+ Punctate epithelial erosions mild arcus, EBMD, 2+ PEE inferiorly   Anterior Chamber deep, clear, narrow temporal angle deep, clear, narrow temporal angle   Iris Round and dilated, No NVI Round and dilated, No NVI   Lens PC IOL in good position PC IOL in good position   Vitreous Vitreous syneresis, Posterior vitreous detachment Vitreous syneresis, PVD, diffuse VH, blood stained vitreous condensations centrally, blood clots settling inferiorly         Fundus Exam       Right Left   Disc Pink and Sharp hazy view, perfused   C/D Ratio 0.3 0.3   Macula Flat, Good foveal reflex, RPE mottling, No heme or edema hazy view, grossly attached   Vessels mild attenuation, mild tortuousity mild attenuation, mild tortuousity   Periphery Attached, rare MA and DBH    hazy view improving peripherally, grossly attached           Refraction     Wearing Rx       Sphere Cylinder Axis Add   Right -0.50 +0.50 153 +2.50   Left -0.25 +1.50 160 +2.50    Type: PAL            IMAGING AND PROCEDURES  Imaging and Procedures for 01/14/2021  OCT, Retina - OU - Both Eyes       Right Eye Quality was good. Central Foveal Thickness: 273. Progression has been stable. Findings include normal foveal contour, no IRF, no SRF.   Left Eye Quality was poor. Progression has no prior data. Findings  include (En face image shows small windows of attached retina).   Notes *Images captured and stored on drive  Diagnosis / Impression:  OD: NFP, no IRF/SRF OS: En face image shows small windows of attached retina  Clinical management:  See below  Abbreviations: NFP - Normal foveal profile. CME - cystoid macular edema. PED - pigment epithelial detachment. IRF - intraretinal fluid. SRF - subretinal fluid. EZ - ellipsoid zone. ERM - epiretinal membrane. ORA - outer retinal atrophy. ORT - outer retinal tubulation. SRHM - subretinal hyper-reflective material. IRHM - intraretinal hyper-reflective material                ASSESSMENT/PLAN:    ICD-10-CM   1. Vitreous hemorrhage of left eye (HCC)  H43.12     2. Retinal edema  H35.81 OCT, Retina - OU - Both Eyes    3. Moderate nonproliferative diabetic retinopathy of both eyes without macular edema associated with type 2 diabetes mellitus ([place])  W54.6270     4. Essential hypertension  I10     5. Hypertensive retinopathy of both eyes  H35.033     6. Pseudophakia, both eyes  Z96.1  1,2. Vitreous Hemorrhage OS  - s/p IVA OS #1 (06.22.22)  - onset overnight and noted upon waking (6.22.22)  - unclear etiology -- hemorrhagic PVD v diabetic hemorrhage  - no RT/RD on 360 peripheral examination  - b-scan 6.29.22 without obvious RT/RD or mass  - VH improving but still very diffuse  - BCVA improved to 20/400 from CF - VH precautions reviewed -- minimize activities, keep head elevated, avoid ASA/NSAIDs/blood thinners as able - f/u 1 wk -- DFE/OCT  3. Moderate nonproliferative diabetic retinopathy w/o DME, both eyes  - OS:  with dense VH - FA 06.22.22 w/ scattered leaking MA -- no obvious NV, but OS with significant blockage from [facility] - OCT without diabetic macular edema, both eyes  - monitor   4,5. Hypertensive retinopathy OU - discussed importance of tight BP control - monitor   6. Pseudophakia OU  - s/p CE/IOL OU  - IOLs in  good position, doing well  - monitor   Ophthalmic Meds Ordered this visit:  No orders of the defined types were placed in this encounter.       Return in about 1 week (around 01/21/2021) for f/u VH OS, DFE, OCT.  There are no Patient Instructions on file for this visit.   Explained the diagnoses, plan, and follow up with the patient and they expressed understanding.  Patient expressed understanding of the importance of proper follow up care.   This document serves as a record of services personally performed by Gardiner Sleeper, MD, PhD. It was created on their behalf by Leonie Douglas, an ophthalmic technician. The creation of this record is the provider's dictation and/or activities during the visit.    Electronically signed by: Leonie Douglas COA, 01/14/21  9:02 AM  This document serves as a record of services personally performed by Gardiner Sleeper, MD, PhD. It was created on their behalf by San Jetty. Owens Shark, OA an ophthalmic technician. The creation of this record is the provider's dictation and/or activities during the visit.    Electronically signed by: San Jetty. Owens Shark, [place] 07.06.2022 9:02 AM  Gardiner Sleeper, M.D., Ph.D. Diseases & Surgery of the Retina and [place] 01/14/2021  I have reviewed the above documentation for accuracy and completeness, and I agree with the above. Gardiner Sleeper, M.D., Ph.D. 01/14/21 9:02 AM  Abbreviations: M myopia (nearsighted); A astigmatism; H hyperopia (farsighted); P presbyopia; Mrx spectacle prescription;  CTL contact lenses; OD right eye; OS left eye; OU both eyes  XT exotropia; ET esotropia; PEK punctate epithelial keratitis; PEE punctate epithelial erosions; DES dry eye syndrome; MGD meibomian gland dysfunction; ATs artificial tears; PFAT's preservative free artificial tears; [place] nuclear sclerotic cataract; PSC posterior subcapsular cataract; ERM epi-retinal membrane; PVD posterior vitreous detachment; RD  retinal detachment; DM diabetes mellitus; DR diabetic retinopathy; NPDR non-proliferative diabetic retinopathy; PDR proliferative diabetic retinopathy; CSME clinically significant macular edema; DME diabetic macular edema; dbh dot blot hemorrhages; CWS cotton wool spot; POAG primary open angle glaucoma; C/D cup-to-disc ratio; HVF humphrey visual field; GVF goldmann visual field; OCT optical coherence tomography; IOP intraocular pressure; BRVO Branch retinal vein occlusion; CRVO central retinal vein occlusion; CRAO central retinal artery occlusion; BRAO branch retinal artery occlusion; RT retinal tear; SB scleral buckle; PPV pars plana vitrectomy; VH Vitreous hemorrhage; PRP panretinal laser photocoagulation; IVK intravitreal kenalog; VMT vitreomacular traction; MH Macular hole;  NVD neovascularization of the disc; NVE neovascularization elsewhere; AREDS age related eye disease study; ARMD age related macular  degeneration; POAG primary open angle glaucoma; EBMD epithelial/anterior basement membrane dystrophy; ACIOL anterior chamber intraocular lens; IOL intraocular lens; PCIOL posterior chamber intraocular lens; Phaco/IOL phacoemulsification with intraocular lens placement; [place] photorefractive keratectomy; LASIK laser assisted in situ keratomileusis; HTN hypertension; DM diabetes mellitus; COPD chronic obstructive pulmonary disease

## 2021-01-14 ENCOUNTER — Encounter (INDEPENDENT_AMBULATORY_CARE_PROVIDER_SITE_OTHER): Payer: Self-pay | Admitting: Ophthalmology

## 2021-01-14 ENCOUNTER — Ambulatory Visit (INDEPENDENT_AMBULATORY_CARE_PROVIDER_SITE_OTHER): Payer: Medicare Other | Admitting: Ophthalmology

## 2021-01-14 ENCOUNTER — Other Ambulatory Visit: Payer: Self-pay

## 2021-01-14 DIAGNOSIS — H3581 Retinal edema: Secondary | ICD-10-CM

## 2021-01-14 DIAGNOSIS — H4312 Vitreous hemorrhage, left eye: Secondary | ICD-10-CM | POA: Diagnosis not present

## 2021-01-14 DIAGNOSIS — H35033 Hypertensive retinopathy, bilateral: Secondary | ICD-10-CM

## 2021-01-14 DIAGNOSIS — E113393 Type 2 diabetes mellitus with moderate nonproliferative diabetic retinopathy without macular edema, bilateral: Secondary | ICD-10-CM

## 2021-01-14 DIAGNOSIS — I10 Essential (primary) hypertension: Secondary | ICD-10-CM | POA: Diagnosis not present

## 2021-01-14 DIAGNOSIS — Z961 Presence of intraocular lens: Secondary | ICD-10-CM | POA: Diagnosis not present

## 2021-01-17 DIAGNOSIS — Z20822 Contact with and (suspected) exposure to covid-19: Secondary | ICD-10-CM | POA: Diagnosis not present

## 2021-01-19 NOTE — Progress Notes (Signed)
[facility] Note  01/21/2021     CHIEF COMPLAINT Patient presents for Retina Follow Up   HISTORY OF PRESENT ILLNESS: Jamie Mays is a 70 y.o. female who presents to the clinic today for:   HPI     Retina Follow Up   Patient presents with  Other.  In left eye.  Duration of 1 week.  Since onset it is gradually improving.  I, the attending physician,  performed the HPI with the patient and updated documentation appropriately.        Comments   1 week follow up [place] is improving OS.  Looks like she is looking through a dirty glass.  BS 178 after eating, 85 this am A1C 6.0      Last edited by Bernarda Caffey, MD on 01/24/2021 12:59 AM.     Pt states    Referring physician: Reynold Bowen, MD [place],  [place] [id number]  HISTORICAL INFORMATION:   Selected notes from the MEDICAL RECORD NUMBER Referred by Dr. Herbert Deaner for acute [facility] OS LEE:  Ocular Hx- PMH-    CURRENT MEDICATIONS: Current Outpatient Medications (Ophthalmic Drugs)  Medication Sig   cycloSPORINE (RESTASIS) 0.05 % ophthalmic emulsion Place 1 drop into both eyes 2 (two) times daily.   No current facility-administered medications for this visit. (Ophthalmic Drugs)   Current Outpatient Medications (Other)  Medication Sig   acetaminophen (TYLENOL) 500 MG tablet Take 1,000 mg by mouth every 8 (eight) hours as needed for moderate pain.   ALPRAZolam (XANAX) 0.5 MG tablet take one by mouth prn anxiety   calcium carbonate (TUMS - DOSED IN MG ELEMENTAL CALCIUM) 500 MG chewable tablet 1 tablet   esomeprazole (NEXIUM) 20 MG capsule 1 capsule   hydrochlorothiazide (MICROZIDE) 12.5 MG capsule Take 12.5 mg by mouth daily.   ibuprofen (ADVIL) 200 MG tablet Take 200-800 mg by mouth every 6 (six) hours as needed for moderate pain.   insulin aspart (NOVOLOG) 100 UNIT/ML injection Inject 75-100 Units into the skin daily. Via insulin pump as directed   irbesartan (AVAPRO)  150 MG tablet Take 150 mg by mouth daily.   levothyroxine (SYNTHROID, LEVOTHROID) 75 MCG tablet Take 1 tablet (75 mcg total) by mouth daily. (Patient taking differently: Take 75 mcg by mouth See admin instructions. Take 75 mg on Mon, Tue, Thurs, Fri, and Sat. Skip Sun and Wed dose)   Magnesium 200 MG CHEW Chew 300 mg by mouth daily.   minocycline (MINOCIN) 100 MG capsule Take 100 mg by mouth daily.   Omega-3 Fatty Acids (FISH OIL ADULT GUMMIES PO) Take 1 capsule by mouth daily.    propranolol (INDERAL) 20 MG tablet TAKE 1 TABLET (20 MG TOTAL) BY MOUTH 3 (THREE) TIMES DAILY AS NEEDED. (Patient taking differently: Take 10-20 mg by mouth 3 (three) times daily as needed (palpitations).)   Turmeric 500 MG CAPS Take 500 mg by mouth daily.   Vitamin D, Ergocalciferol, (DRISDOL) 1.25 MG (50000 UT) CAPS capsule Take 50,000 Units by mouth every Tuesday.    glucose blood test strip 1 each by Other route 6 (six) times daily. Use as instructed    No current facility-administered medications for this visit. (Other)      REVIEW OF SYSTEMS: ROS   Positive for: Musculoskeletal, Endocrine, Eyes Negative for: Constitutional, Gastrointestinal, Neurological, Skin, Genitourinary, HENT, Cardiovascular, Respiratory, Psychiatric, Allergic/Imm, Heme/Lymph Last edited by Leonie Douglas, COA on 01/21/2021  9:23 AM.  ALLERGIES Allergies  Allergen Reactions   Doxycycline Other (See Comments)    Joint pain   Lisinopril Cough   Oxycodone     Hallucinations     PAST MEDICAL HISTORY Past Medical History:  Diagnosis Date   Ankle fracture    left   Arthritis    Diabetes mellitus    Family history of adverse reaction to anesthesia    " they had difficulty waking my dad up; he was about 68-19 years old."   Family history of MI (myocardial infarction)    GERD (gastroesophageal reflux disease)    Hypertension    Hypothyroidism    Painful orthopaedic hardware ([place])    PVC (premature ventricular  contraction)    Smoker    former  " quit about 40 years ago"   Syncope    Wears glasses    Past Surgical History:  Procedure Laterality Date   CARDIAC CATHETERIZATION  2007   neg   CATARACT EXTRACTION     CATARACT EXTRACTION W/ INTRAOCULAR LENS  IMPLANT, BILATERAL     CESAREAN SECTION     EYE SURGERY     HARDWARE REMOVAL Left 03/19/2020   Procedure: HARDWARE REMOVAL LEFT ANKLE;  Surgeon: Shona Needles, MD;  Location: [facility];  Service: Orthopedics;  Laterality: Left;   ORIF ANKLE FRACTURE Left 07/19/2019   Procedure: OPEN REDUCTION INTERNAL FIXATION (ORIF) ANKLE FRACTURE;  Surgeon: Shona Needles, MD;  Location: [facility];  Service: Orthopedics;  Laterality: Left;    FAMILY HISTORY Family History  Problem Relation Age of Onset   Coronary artery disease Mother    Kidney disease Mother    Diabetes Mother    Heart disease Mother        heart attack   Hypertension Mother    Macular degeneration Father    Diabetes Sister    Diabetes Brother    Cancer Brother        lung cancer   Cancer Brother 9       lungs, brain, chest wall, liver cancer, stage 4   Diabetes Maternal Grandmother    Cancer Other 64       lung , brain    SOCIAL HISTORY Social History   Tobacco Use   Smoking status: Former    Types: Cigarettes    Quit date: 07/12/1981    Years since quitting: 39.5   Smokeless tobacco: Never  Vaping Use   Vaping Use: Never used  Substance Use Topics   Alcohol use: Not Currently    Alcohol/week: 0.0 standard drinks    Comment: very rare   Drug use: No         OPHTHALMIC EXAM:  Base Eye Exam     Visual Acuity (Snellen - Linear)       Right Left   Dist cc 20/20- 20/70+    Correction: Glasses         Tonometry (Tonopen, 9:29 AM)       Right Left   Pressure 17 16         Pupils       Dark Light Shape React APD   Right 3 2 Round Brisk None   Left 3 2 Round Brisk None         Visual Fields (Counting fingers)       Left Right    Full Full          Extraocular Movement       Right Left  Full Full         Neuro/Psych     Oriented x3: Yes   Mood/Affect: Normal         Dilation     Left eye: 1.0% Mydriacyl, 2.5% Phenylephrine @ 9:29 AM  Dilated OS only per pt request          Slit Lamp and Fundus Exam     Slit Lamp Exam       Right Left   Lids/Lashes Dermatochalasis - upper lid Dermatochalasis - upper lid   Conjunctiva/Sclera White and quiet temporal pinguecula   Cornea mild arcus, EBMD, 1-2+ Punctate epithelial erosions mild arcus, EBMD, 2+ PEE inferiorly   Anterior Chamber deep, clear, narrow temporal angle deep, clear, narrow temporal angle   Iris Round and dilated, No NVI Round and dilated, No NVI   Lens PC IOL in good position PC IOL in good position   Vitreous Vitreous syneresis, Posterior vitreous detachment Vitreous syneresis, PVD, central blood stained VH shrinking and fading, blood clots settling inferiorly         Fundus Exam       Right Left   Disc Pink and Sharp hazy view, perfused   C/D Ratio 0.3 0.3   Macula Flat, Good foveal reflex, RPE mottling, No heme or edema hazy view, grossly attached   Vessels mild attenuation, mild tortuousity mild attenuation, mild tortuousity   Periphery Attached, rare MA and DBH    hazy view improving peripherally, grossly attached           Refraction     Wearing Rx       Sphere Cylinder Axis Add   Right -0.50 +0.50 153 +2.50   Left -0.25 +1.50 160 +2.50    Type: PAL            IMAGING AND PROCEDURES  Imaging and Procedures for 01/21/2021  OCT, Retina - OU - Both Eyes       Right Eye Quality was good. Central Foveal Thickness: 277. Progression has been stable. Findings include normal foveal contour, no IRF, no SRF.   Left Eye Quality was poor. Progression has improved. Findings include no IRF, no SRF, normal foveal contour (Interval improvement in vitreous opacities).   Notes *Images captured and stored on  drive  Diagnosis / Impression:  OD: NFP, no IRF/SRF OS: interval improvement in vitreous opacities  Clinical management:  See below  Abbreviations: NFP - Normal foveal profile. CME - cystoid macular edema. PED - pigment epithelial detachment. IRF - intraretinal fluid. SRF - subretinal fluid. EZ - ellipsoid zone. ERM - epiretinal membrane. ORA - outer retinal atrophy. ORT - outer retinal tubulation. SRHM - subretinal hyper-reflective material. IRHM - intraretinal hyper-reflective material                 ASSESSMENT/PLAN:    ICD-10-CM   1. Vitreous hemorrhage of left eye (HCC)  H43.12     2. Retinal edema  H35.81 OCT, Retina - OU - Both Eyes    3. Moderate nonproliferative diabetic retinopathy of both eyes without macular edema associated with type 2 diabetes mellitus ([place])  V25.3664     4. Essential hypertension  I10     5. Hypertensive retinopathy of both eyes  H35.033     6. Pseudophakia, both eyes  Z96.1       1,2. Vitreous Hemorrhage OS -- improving  - s/p IVA OS #1 (06.22.22)  - onset overnight and noted upon waking (6.22.22)  - unclear etiology --  hemorrhagic PVD v diabetic hemorrhage  - no RT/RD on 360 peripheral examination  - b-scan 6.29.22 without obvious RT/RD or mass  - central VH fading and settling inferiorly  - BCVA improved to 20/70 from 20/400 - VH precautions reviewed -- minimize activities, keep head elevated, avoid ASA/NSAIDs/blood thinners as able - f/u 1 wk -- DFE/OCT  3. Moderate nonproliferative diabetic retinopathy w/o DME, both eyes  - OS:  with dense VH - FA 06.22.22 w/ scattered leaking MA -- no obvious NV, but OS with significant blockage from [facility] - OCT without diabetic macular edema, both eyes  - monitor  4,5. Hypertensive retinopathy OU - discussed importance of tight BP control - monitor  6. Pseudophakia OU  - s/p CE/IOL OU  - IOLs in good position, doing well  - monitor  Ophthalmic Meds Ordered this visit:  No orders of  the defined types were placed in this encounter.       Return in about 1 week (around 01/28/2021).  There are no Patient Instructions on file for this visit.   Explained the diagnoses, plan, and follow up with the patient and they expressed understanding.  Patient expressed understanding of the importance of proper follow up care.   This document serves as a record of services personally performed by Gardiner Sleeper, MD, PhD. It was created on their behalf by Roselee Nova, COMT. The creation of this record is the provider's dictation and/or activities during the visit.  Electronically signed by: Roselee Nova, COMT 01/24/21 1:04 AM  This document serves as a record of services personally performed by Gardiner Sleeper, MD, PhD. It was created on their behalf by San Jetty. Owens Shark, OA an ophthalmic technician. The creation of this record is the provider's dictation and/or activities during the visit.    Electronically signed by: San Jetty. Owens Shark, [place] 07.13.2022 1:04 AM   Gardiner Sleeper, M.D., Ph.D. Diseases & Surgery of the Retina and Vitreous [facility]  I have reviewed the above documentation for accuracy and completeness, and I agree with the above. Gardiner Sleeper, M.D., Ph.D. 01/24/21 1:04 AM   Abbreviations: M myopia (nearsighted); A astigmatism; H hyperopia (farsighted); P presbyopia; Mrx spectacle prescription;  CTL contact lenses; OD right eye; OS left eye; OU both eyes  XT exotropia; ET esotropia; PEK punctate epithelial keratitis; PEE punctate epithelial erosions; DES dry eye syndrome; MGD meibomian gland dysfunction; ATs artificial tears; PFAT's preservative free artificial tears; [place] nuclear sclerotic cataract; PSC posterior subcapsular cataract; ERM epi-retinal membrane; PVD posterior vitreous detachment; RD retinal detachment; DM diabetes mellitus; DR diabetic retinopathy; NPDR non-proliferative diabetic retinopathy; PDR proliferative diabetic retinopathy;  CSME clinically significant macular edema; DME diabetic macular edema; dbh dot blot hemorrhages; CWS cotton wool spot; POAG primary open angle glaucoma; C/D cup-to-disc ratio; HVF humphrey visual field; GVF goldmann visual field; OCT optical coherence tomography; IOP intraocular pressure; BRVO Branch retinal vein occlusion; CRVO central retinal vein occlusion; CRAO central retinal artery occlusion; BRAO branch retinal artery occlusion; RT retinal tear; SB scleral buckle; PPV pars plana vitrectomy; VH Vitreous hemorrhage; PRP panretinal laser photocoagulation; IVK intravitreal kenalog; VMT vitreomacular traction; MH Macular hole;  NVD neovascularization of the disc; NVE neovascularization elsewhere; AREDS age related eye disease study; ARMD age related macular degeneration; POAG primary open angle glaucoma; EBMD epithelial/anterior basement membrane dystrophy; ACIOL anterior chamber intraocular lens; IOL intraocular lens; PCIOL posterior chamber intraocular lens; Phaco/IOL phacoemulsification with intraocular lens placement; PRK photorefractive keratectomy; LASIK laser assisted in situ  keratomileusis; HTN hypertension; DM diabetes mellitus; COPD chronic obstructive pulmonary disease

## 2021-01-21 ENCOUNTER — Ambulatory Visit (INDEPENDENT_AMBULATORY_CARE_PROVIDER_SITE_OTHER): Payer: Medicare Other | Admitting: Ophthalmology

## 2021-01-21 ENCOUNTER — Other Ambulatory Visit: Payer: Self-pay

## 2021-01-21 DIAGNOSIS — H4312 Vitreous hemorrhage, left eye: Secondary | ICD-10-CM | POA: Diagnosis not present

## 2021-01-21 DIAGNOSIS — H35033 Hypertensive retinopathy, bilateral: Secondary | ICD-10-CM | POA: Diagnosis not present

## 2021-01-21 DIAGNOSIS — H3581 Retinal edema: Secondary | ICD-10-CM | POA: Diagnosis not present

## 2021-01-21 DIAGNOSIS — Z961 Presence of intraocular lens: Secondary | ICD-10-CM | POA: Diagnosis not present

## 2021-01-21 DIAGNOSIS — E113393 Type 2 diabetes mellitus with moderate nonproliferative diabetic retinopathy without macular edema, bilateral: Secondary | ICD-10-CM | POA: Diagnosis not present

## 2021-01-21 DIAGNOSIS — I10 Essential (primary) hypertension: Secondary | ICD-10-CM

## 2021-01-24 ENCOUNTER — Encounter (INDEPENDENT_AMBULATORY_CARE_PROVIDER_SITE_OTHER): Payer: Self-pay | Admitting: Ophthalmology

## 2021-01-26 NOTE — Progress Notes (Signed)
[facility] Note  01/28/2021     CHIEF COMPLAINT Patient presents for Retina Follow Up   HISTORY OF PRESENT ILLNESS: Jamie Mays is a 70 y.o. female who presents to the clinic today for:   HPI     Retina Follow Up   Patient presents with  Diabetic Retinopathy.  In both eyes.  Duration of 1 week.  Since onset it is gradually improving.  I, the attending physician,  performed the HPI with the patient and updated documentation appropriately.        Comments   1 week follow up VH OS, NPDR OU-  Patient states the heme has improved.  Only once and awhile a small area will come down in vision but even better today than yesterday.  BS 118, A1C 6.0      Last edited by Bernarda Caffey, MD on 01/29/2021  9:51 PM.    Pt states much improvement today from even yesterday.    Referring physician: Reynold Bowen, MD [place],  [place] [id number]  HISTORICAL INFORMATION:   Selected notes from the MEDICAL RECORD NUMBER Referred by Dr. Herbert Deaner for acute [facility] OS LEE:  Ocular Hx- PMH-    CURRENT MEDICATIONS: Current Outpatient Medications (Ophthalmic Drugs)  Medication Sig   cycloSPORINE (RESTASIS) 0.05 % ophthalmic emulsion Place 1 drop into both eyes 2 (two) times daily.   No current facility-administered medications for this visit. (Ophthalmic Drugs)   Current Outpatient Medications (Other)  Medication Sig   acetaminophen (TYLENOL) 500 MG tablet Take 1,000 mg by mouth every 8 (eight) hours as needed for moderate pain.   ALPRAZolam (XANAX) 0.5 MG tablet take one by mouth prn anxiety   calcium carbonate (TUMS - DOSED IN MG ELEMENTAL CALCIUM) 500 MG chewable tablet 1 tablet   esomeprazole (NEXIUM) 20 MG capsule 1 capsule   hydrochlorothiazide (MICROZIDE) 12.5 MG capsule Take 12.5 mg by mouth daily.   ibuprofen (ADVIL) 200 MG tablet Take 200-800 mg by mouth every 6 (six) hours as needed for moderate pain.   insulin aspart (NOVOLOG) 100 UNIT/ML  injection Inject 75-100 Units into the skin daily. Via insulin pump as directed   irbesartan (AVAPRO) 150 MG tablet Take 150 mg by mouth daily.   levothyroxine (SYNTHROID, LEVOTHROID) 75 MCG tablet Take 1 tablet (75 mcg total) by mouth daily. (Patient taking differently: Take 75 mcg by mouth See admin instructions. Take 75 mg on Mon, Tue, Thurs, Fri, and Sat. Skip Sun and Wed dose)   Magnesium 200 MG CHEW Chew 300 mg by mouth daily.   minocycline (MINOCIN) 100 MG capsule Take 100 mg by mouth daily.   Omega-3 Fatty Acids (FISH OIL ADULT GUMMIES PO) Take 1 capsule by mouth daily.    propranolol (INDERAL) 20 MG tablet TAKE 1 TABLET (20 MG TOTAL) BY MOUTH 3 (THREE) TIMES DAILY AS NEEDED. (Patient taking differently: Take 10-20 mg by mouth 3 (three) times daily as needed (palpitations).)   Turmeric 500 MG CAPS Take 500 mg by mouth daily.   Vitamin D, Ergocalciferol, (DRISDOL) 1.25 MG (50000 UT) CAPS capsule Take 50,000 Units by mouth every Tuesday.    glucose blood test strip 1 each by Other route 6 (six) times daily. Use as instructed    No current facility-administered medications for this visit. (Other)      REVIEW OF SYSTEMS: ROS   Positive for: Musculoskeletal, Endocrine, Eyes Negative for: Constitutional, Gastrointestinal, Neurological, Skin, Genitourinary, HENT, Cardiovascular, Respiratory, Psychiatric, Allergic/Imm, Heme/Lymph  Last edited by Leonie Douglas, COA on 01/28/2021  8:54 AM.           ALLERGIES Allergies  Allergen Reactions   Doxycycline Other (See Comments)    Joint pain   Lisinopril Cough   Oxycodone     Hallucinations     PAST MEDICAL HISTORY Past Medical History:  Diagnosis Date   Ankle fracture    left   Arthritis    Diabetes mellitus    Family history of adverse reaction to anesthesia    " they had difficulty waking my dad up; he was about 31-49 years old."   Family history of MI (myocardial infarction)    GERD (gastroesophageal reflux disease)     Hypertension    Hypothyroidism    Painful orthopaedic hardware ([place])    PVC (premature ventricular contraction)    Smoker    former  " quit about 40 years ago"   Syncope    Wears glasses    Past Surgical History:  Procedure Laterality Date   CARDIAC CATHETERIZATION  2007   neg   CATARACT EXTRACTION     CATARACT EXTRACTION W/ INTRAOCULAR LENS  IMPLANT, BILATERAL     CESAREAN SECTION     EYE SURGERY     HARDWARE REMOVAL Left 03/19/2020   Procedure: HARDWARE REMOVAL LEFT ANKLE;  Surgeon: Shona Needles, MD;  Location: [place];  Service: Orthopedics;  Laterality: Left;   ORIF ANKLE FRACTURE Left 07/19/2019   Procedure: OPEN REDUCTION INTERNAL FIXATION (ORIF) ANKLE FRACTURE;  Surgeon: Shona Needles, MD;  Location: [place];  Service: Orthopedics;  Laterality: Left;    FAMILY HISTORY Family History  Problem Relation Age of Onset   Coronary artery disease Mother    Kidney disease Mother    Diabetes Mother    Heart disease Mother        heart attack   Hypertension Mother    Macular degeneration Father    Diabetes Sister    Diabetes Brother    Cancer Brother        lung cancer   Cancer Brother 36       lungs, brain, chest wall, liver cancer, stage 4   Diabetes Maternal Grandmother    Cancer Other 31       lung , brain    SOCIAL HISTORY Social History   Tobacco Use   Smoking status: Former    Types: Cigarettes    Quit date: 07/12/1981    Years since quitting: 39.5   Smokeless tobacco: Never  Vaping Use   Vaping Use: Never used  Substance Use Topics   Alcohol use: Not Currently    Alcohol/week: 0.0 standard drinks    Comment: very rare   Drug use: No         OPHTHALMIC EXAM:  Base Eye Exam     Visual Acuity (Snellen - Linear)       Right Left   Dist cc 20/20 20/40+   Dist ph cc  NI    Correction: Glasses         Tonometry (Tonopen, 8:59 AM)       Right Left   Pressure 18 17         Pupils       Dark Light Shape React APD   Right 4 3 Round  Brisk None   Left 4 3 Round Brisk None         Visual Fields (Counting fingers)  Left Right    Full Full         Extraocular Movement       Right Left    Full Full         Neuro/Psych     Oriented x3: Yes   Mood/Affect: Normal         Dilation     Both eyes: 1.0% Mydriacyl, 2.5% Phenylephrine @ 8:59 AM           Slit Lamp and Fundus Exam     Slit Lamp Exam       Right Left   Lids/Lashes Dermatochalasis - upper lid Dermatochalasis - upper lid   Conjunctiva/Sclera White and quiet temporal pinguecula   Cornea mild arcus, EBMD, 1-2+ Punctate epithelial erosions mild arcus, EBMD, 1-2+ PEE inferiorly   Anterior Chamber deep, clear, narrow temporal angle Deep and clear   Iris Round and dilated, No NVI Round and dilated, No NVI   Lens PC IOL in good position PC IOL in good position   Vitreous Vitreous syneresis, Posterior vitreous detachment Vitreous syneresis, PVD, central blood stained VH shrinking and fading, blood clots settling inferiorly -- turning white         Fundus Exam       Right Left   Disc Pink and Sharp hazy view, perfused   C/D Ratio 0.3 0.3   Macula Flat, Good foveal reflex, RPE mottling, No heme or edema hazy view, grossly attached   Vessels mild attenuation, mild tortuousity mild attenuation, mild tortuousity   Periphery Attached, rare MA and DBH    hazy view improving peripherally, grossly attached           Refraction     Wearing Rx       Sphere Cylinder Axis Add   Right -0.50 +0.50 153 +2.50   Left -0.25 +1.50 160 +2.50    Type: PAL            IMAGING AND PROCEDURES  Imaging and Procedures for 01/28/2021  OCT, Retina - OU - Both Eyes       Right Eye Quality was good. Central Foveal Thickness: 274. Progression has been stable. Findings include normal foveal contour, no IRF, no SRF.   Left Eye Quality was poor. Central Foveal Thickness: 289. Progression has improved. Findings include no IRF, no SRF, normal  foveal contour (Interval improvement in vitreous opacities).   Notes *Images captured and stored on drive  Diagnosis / Impression:  OD: NFP, no IRF/SRF OS: interval improvement in vitreous opacities  Clinical management:  See below  Abbreviations: NFP - Normal foveal profile. CME - cystoid macular edema. PED - pigment epithelial detachment. IRF - intraretinal fluid. SRF - subretinal fluid. EZ - ellipsoid zone. ERM - epiretinal membrane. ORA - outer retinal atrophy. ORT - outer retinal tubulation. SRHM - subretinal hyper-reflective material. IRHM - intraretinal hyper-reflective material            ASSESSMENT/PLAN:    ICD-10-CM   1. Vitreous hemorrhage of left eye (HCC)  H43.12     2. Retinal edema  H35.81 OCT, Retina - OU - Both Eyes    3. Moderate nonproliferative diabetic retinopathy of both eyes without macular edema associated with type 2 diabetes mellitus ([place])  Q73.4193     4. Essential hypertension  I10     5. Hypertensive retinopathy of both eyes  H35.033     6. Pseudophakia, both eyes  Z96.1      1,2. Vitreous Hemorrhage OS -- improving  -  s/p IVA OS #1 (06.22.22)  - onset overnight and noted upon waking (6.22.22)  - unclear etiology -- hemorrhagic PVD v diabetic hemorrhage  - no RT/RD on 360 peripheral examination  - b-scan 6.29.22 without obvious RT/RD or mass  - central VH fading and settling inferiorly  - BCVA improved to 20/40 from 20/70 - VH precautions reviewed -- minimize activities, keep head elevated, avoid ASA/NSAIDs/blood thinners as able - may start back walking for exercise - f/u 1 wk -- DFE/OCT  3. Moderate nonproliferative diabetic retinopathy w/o DME, both eyes  - OS: with dense VH - FA 06.22.22 w/ scattered leaking MA -- no obvious NV, but OS with significant blockage from [facility] - OCT without diabetic macular edema, both eyes  - monitor  4,5. Hypertensive retinopathy OU - discussed importance of tight BP control - monitor  6.  Pseudophakia OU  - s/p CE/IOL OU  - IOLs in good position, doing well  - monitor  Ophthalmic Meds Ordered this visit:  No orders of the defined types were placed in this encounter.      Return Return in 1 week, for DFE, OCT.  There are no Patient Instructions on file for this visit.   Explained the diagnoses, plan, and follow up with the patient and they expressed understanding.  Patient expressed understanding of the importance of proper follow up care.   This document serves as a record of services personally performed by Gardiner Sleeper, MD, PhD. It was created on their behalf by Leonie Douglas, an ophthalmic technician. The creation of this record is the provider's dictation and/or activities during the visit.    Electronically signed by: Leonie Douglas COA, 01/29/21  9:55 PM  This document serves as a record of services personally performed by Gardiner Sleeper, MD, PhD. It was created on their behalf by Roselee Nova, COMT. The creation of this record is the provider's dictation and/or activities during the visit.  Electronically signed by: Roselee Nova, COMT 01/29/21 9:55 PM  Gardiner Sleeper, M.D., Ph.D. Diseases & Surgery of the Retina and Vitreous [facility]  I have reviewed the above documentation for accuracy and completeness, and I agree with the above. Gardiner Sleeper, M.D., Ph.D. 01/29/21 9:55 PM   Abbreviations: M myopia (nearsighted); A astigmatism; H hyperopia (farsighted); P presbyopia; Mrx spectacle prescription;  CTL contact lenses; OD right eye; OS left eye; OU both eyes  XT exotropia; ET esotropia; PEK punctate epithelial keratitis; PEE punctate epithelial erosions; DES dry eye syndrome; MGD meibomian gland dysfunction; ATs artificial tears; PFAT's preservative free artificial tears; [place] nuclear sclerotic cataract; PSC posterior subcapsular cataract; ERM epi-retinal membrane; PVD posterior vitreous detachment; RD retinal detachment; DM diabetes  mellitus; DR diabetic retinopathy; NPDR non-proliferative diabetic retinopathy; PDR proliferative diabetic retinopathy; CSME clinically significant macular edema; DME diabetic macular edema; dbh dot blot hemorrhages; CWS cotton wool spot; POAG primary open angle glaucoma; C/D cup-to-disc ratio; HVF humphrey visual field; GVF goldmann visual field; OCT optical coherence tomography; IOP intraocular pressure; BRVO Branch retinal vein occlusion; CRVO central retinal vein occlusion; CRAO central retinal artery occlusion; BRAO branch retinal artery occlusion; RT retinal tear; SB scleral buckle; PPV pars plana vitrectomy; VH Vitreous hemorrhage; PRP panretinal laser photocoagulation; IVK intravitreal kenalog; VMT vitreomacular traction; MH Macular hole;  NVD neovascularization of the disc; NVE neovascularization elsewhere; AREDS age related eye disease study; ARMD age related macular degeneration; POAG primary open angle glaucoma; EBMD epithelial/anterior basement membrane dystrophy; ACIOL anterior chamber intraocular lens;  IOL intraocular lens; PCIOL posterior chamber intraocular lens; Phaco/IOL phacoemulsification with intraocular lens placement; [place] photorefractive keratectomy; LASIK laser assisted in situ keratomileusis; HTN hypertension; DM diabetes mellitus; COPD chronic obstructive pulmonary disease

## 2021-01-28 ENCOUNTER — Other Ambulatory Visit: Payer: Self-pay

## 2021-01-28 ENCOUNTER — Ambulatory Visit (INDEPENDENT_AMBULATORY_CARE_PROVIDER_SITE_OTHER): Payer: Medicare Other | Admitting: Ophthalmology

## 2021-01-28 DIAGNOSIS — H3581 Retinal edema: Secondary | ICD-10-CM | POA: Diagnosis not present

## 2021-01-28 DIAGNOSIS — E113393 Type 2 diabetes mellitus with moderate nonproliferative diabetic retinopathy without macular edema, bilateral: Secondary | ICD-10-CM | POA: Diagnosis not present

## 2021-01-28 DIAGNOSIS — H35033 Hypertensive retinopathy, bilateral: Secondary | ICD-10-CM | POA: Diagnosis not present

## 2021-01-28 DIAGNOSIS — H4312 Vitreous hemorrhage, left eye: Secondary | ICD-10-CM

## 2021-01-28 DIAGNOSIS — Z961 Presence of intraocular lens: Secondary | ICD-10-CM | POA: Diagnosis not present

## 2021-01-28 DIAGNOSIS — I10 Essential (primary) hypertension: Secondary | ICD-10-CM | POA: Diagnosis not present

## 2021-01-29 ENCOUNTER — Encounter (INDEPENDENT_AMBULATORY_CARE_PROVIDER_SITE_OTHER): Payer: Self-pay | Admitting: Ophthalmology

## 2021-02-02 NOTE — Progress Notes (Signed)
[facility] Note  02/04/2021     CHIEF COMPLAINT Patient presents for Retina Follow Up   HISTORY OF PRESENT ILLNESS: Jamie Mays is a 70 y.o. female who presents to the clinic today for:   HPI     Retina Follow Up   Patient presents with  Other.  In left eye.  Duration of 1 week.  Since onset it is gradually improving.  I, the attending physician,  performed the HPI with the patient and updated documentation appropriately.        Comments   1 week follow up [place] seems a little better.   BS 78 this am  A1C 6.0      Last edited by Bernarda Caffey, MD on 02/04/2021  8:12 AM.     Pt states much improvement today from even yesterday.    Referring physician: Reynold Bowen, MD [place],  [place] [id number]  HISTORICAL INFORMATION:   Selected notes from the MEDICAL RECORD NUMBER Referred by Dr. Herbert Deaner for acute [facility] OS LEE:  Ocular Hx- PMH-    CURRENT MEDICATIONS: Current Outpatient Medications (Ophthalmic Drugs)  Medication Sig   cycloSPORINE (RESTASIS) 0.05 % ophthalmic emulsion Place 1 drop into both eyes 2 (two) times daily.   No current facility-administered medications for this visit. (Ophthalmic Drugs)   Current Outpatient Medications (Other)  Medication Sig   acetaminophen (TYLENOL) 500 MG tablet Take 1,000 mg by mouth every 8 (eight) hours as needed for moderate pain.   ALPRAZolam (XANAX) 0.5 MG tablet take one by mouth prn anxiety   calcium carbonate (TUMS - DOSED IN MG ELEMENTAL CALCIUM) 500 MG chewable tablet 1 tablet   esomeprazole (NEXIUM) 20 MG capsule 1 capsule   hydrochlorothiazide (MICROZIDE) 12.5 MG capsule Take 12.5 mg by mouth daily.   ibuprofen (ADVIL) 200 MG tablet Take 200-800 mg by mouth every 6 (six) hours as needed for moderate pain.   insulin aspart (NOVOLOG) 100 UNIT/ML injection Inject 75-100 Units into the skin daily. Via insulin pump as directed   irbesartan (AVAPRO) 150 MG tablet  Take 150 mg by mouth daily.   levothyroxine (SYNTHROID, LEVOTHROID) 75 MCG tablet Take 1 tablet (75 mcg total) by mouth daily. (Patient taking differently: Take 75 mcg by mouth See admin instructions. Take 75 mg on Mon, Tue, Thurs, Fri, and Sat. Skip Sun and Wed dose)   Magnesium 200 MG CHEW Chew 300 mg by mouth daily.   minocycline (MINOCIN) 100 MG capsule Take 100 mg by mouth daily.   Omega-3 Fatty Acids (FISH OIL ADULT GUMMIES PO) Take 1 capsule by mouth daily.    propranolol (INDERAL) 20 MG tablet TAKE 1 TABLET (20 MG TOTAL) BY MOUTH 3 (THREE) TIMES DAILY AS NEEDED. (Patient taking differently: Take 10-20 mg by mouth 3 (three) times daily as needed (palpitations).)   Turmeric 500 MG CAPS Take 500 mg by mouth daily.   Vitamin D, Ergocalciferol, (DRISDOL) 1.25 MG (50000 UT) CAPS capsule Take 50,000 Units by mouth every Tuesday.    glucose blood test strip 1 each by Other route 6 (six) times daily. Use as instructed    No current facility-administered medications for this visit. (Other)      REVIEW OF SYSTEMS: ROS   Positive for: Musculoskeletal, Endocrine, Eyes Negative for: Constitutional, Gastrointestinal, Neurological, Skin, Genitourinary, HENT, Cardiovascular, Respiratory, Psychiatric, Allergic/Imm, Heme/Lymph Last edited by Leonie Douglas, COA on 02/04/2021  7:44 AM.     ALLERGIES Allergies  Allergen Reactions   Doxycycline Other (See Comments)    Joint pain   Lisinopril Cough   Oxycodone     Hallucinations     PAST MEDICAL HISTORY Past Medical History:  Diagnosis Date   Ankle fracture    left   Arthritis    Diabetes mellitus    Family history of adverse reaction to anesthesia    " they had difficulty waking my dad up; he was about 59-52 years old."   Family history of MI (myocardial infarction)    GERD (gastroesophageal reflux disease)    Hypertension    Hypothyroidism    Painful orthopaedic hardware ([place])    PVC (premature ventricular contraction)    Smoker     former  " quit about 40 years ago"   Syncope    Wears glasses    Past Surgical History:  Procedure Laterality Date   CARDIAC CATHETERIZATION  2007   neg   CATARACT EXTRACTION     CATARACT EXTRACTION W/ INTRAOCULAR LENS  IMPLANT, BILATERAL     CESAREAN SECTION     EYE SURGERY     HARDWARE REMOVAL Left 03/19/2020   Procedure: HARDWARE REMOVAL LEFT ANKLE;  Surgeon: Shona Needles, MD;  Location: [facility];  Service: Orthopedics;  Laterality: Left;   ORIF ANKLE FRACTURE Left 07/19/2019   Procedure: OPEN REDUCTION INTERNAL FIXATION (ORIF) ANKLE FRACTURE;  Surgeon: Shona Needles, MD;  Location: [facility];  Service: Orthopedics;  Laterality: Left;    FAMILY HISTORY Family History  Problem Relation Age of Onset   Coronary artery disease Mother    Kidney disease Mother    Diabetes Mother    Heart disease Mother        heart attack   Hypertension Mother    Macular degeneration Father    Diabetes Sister    Diabetes Brother    Cancer Brother        lung cancer   Cancer Brother 30       lungs, brain, chest wall, liver cancer, stage 4   Diabetes Maternal Grandmother    Cancer Other 35       lung , brain    SOCIAL HISTORY Social History   Tobacco Use   Smoking status: Former    Types: Cigarettes    Quit date: 07/12/1981    Years since quitting: 39.5   Smokeless tobacco: Never  Vaping Use   Vaping Use: Never used  Substance Use Topics   Alcohol use: Not Currently    Alcohol/week: 0.0 standard drinks    Comment: very rare   Drug use: No         OPHTHALMIC EXAM:  Base Eye Exam     Visual Acuity (Snellen - Linear)       Right Left   Dist cc 20/20 20/20-2         Tonometry (Tonopen, 7:48 AM)       Right Left   Pressure 17 16         Pupils       Dark Light Shape React APD   Right 4 3 Round Brisk None   Left 4 3 Round Brisk None         Visual Fields (Counting fingers)       Left Right    Full Full         Extraocular Movement       Right Left     Full Full  Neuro/Psych     Oriented x3: Yes   Mood/Affect: Normal         Dilation     Left eye: 1.0% Mydriacyl, 2.5% Phenylephrine @ 7:48 AM           Slit Lamp and Fundus Exam     Slit Lamp Exam       Right Left   Lids/Lashes Dermatochalasis - upper lid Dermatochalasis - upper lid   Conjunctiva/Sclera White and quiet temporal pinguecula   Cornea mild arcus, EBMD, 1-2+ Punctate epithelial erosions mild arcus, EBMD, 1-2+ PEE inferiorly   Anterior Chamber deep, clear, narrow temporal angle Deep and clear   Iris Round and reactive Round and dilated, No NVI   Lens PC IOL in good position PC IOL in good position   Vitreous Vitreous syneresis, Posterior vitreous detachment Vitreous syneresis, PVD, +RBC, white blood stained vitreous condensations settling inferiorly         Fundus Exam       Right Left   Disc Pink and Sharp Mild pallor, sharp rim   C/D Ratio 0.3 0.3   Macula Flat, Good foveal reflex, RPE mottling, No heme or edema Flat, blunted foveal reflex, mild RPE mottling   Vessels mild attenuation, mild tortuousity mild attenuation, mild tortuousity   Periphery Attached, rare MA and DBH    Inferior periphery obscured by blood clots, otherwise attached, no RT/RD           Refraction     Wearing Rx       Sphere Cylinder Axis Add   Right -0.50 +0.50 153 +2.50   Left -0.25 +1.50 160 +2.50    Type: PAL            IMAGING AND PROCEDURES  Imaging and Procedures for 02/04/2021  OCT, Retina - OU - Both Eyes       Right Eye Quality was good. Central Foveal Thickness: 274. Progression has been stable. Findings include normal foveal contour, no IRF, no SRF.   Left Eye Quality was poor. Central Foveal Thickness: 287. Progression has been stable. Findings include no IRF, no SRF, normal foveal contour (Interval improvement in vitreous opacities).   Notes *Images captured and stored on drive  Diagnosis / Impression:  OD: NFP, no IRF/SRF OS:  interval improvement in vitreous opacities  Clinical management:  See below  Abbreviations: NFP - Normal foveal profile. CME - cystoid macular edema. PED - pigment epithelial detachment. IRF - intraretinal fluid. SRF - subretinal fluid. EZ - ellipsoid zone. ERM - epiretinal membrane. ORA - outer retinal atrophy. ORT - outer retinal tubulation. SRHM - subretinal hyper-reflective material. IRHM - intraretinal hyper-reflective material      Intravitreal Injection, Pharmacologic Agent - OS - Left Eye       Time Out 02/04/2021. 8:24 AM. Confirmed correct patient, procedure, site, and patient consented.   Anesthesia Topical anesthesia was used. Anesthetic medications included Lidocaine 2%, Proparacaine 0.5%.   Procedure Preparation included 5% betadine to ocular surface, eyelid speculum. A supplied needle was used.   Injection: 1.25 mg Bevacizumab 1.'25mg'$ /0.46m   Route: Intravitreal, Site: Left Eye   NDC: [id number] Expiration date: 03/09/2021, Waste: 0.05 mL   Post-op Post injection exam found visual acuity of at least counting fingers. The patient tolerated the procedure well. There were no complications. The patient received written and verbal post procedure care education.             ASSESSMENT/PLAN:    ICD-10-CM   1. Vitreous  hemorrhage of left eye (HCC)  H43.12 Intravitreal Injection, Pharmacologic Agent - OS - Left Eye    Bevacizumab (AVASTIN) SOLN 1.25 mg    2. Retinal edema  H35.81 OCT, Retina - OU - Both Eyes    3. Moderate nonproliferative diabetic retinopathy of both eyes without macular edema associated with type 2 diabetes mellitus (HCC)  [id number] Intravitreal Injection, Pharmacologic Agent - OS - Left Eye    Bevacizumab (AVASTIN) SOLN 1.25 mg    4. Essential hypertension  I10     5. Hypertensive retinopathy of both eyes  H35.033     6. Pseudophakia, both eyes  Z96.1      1,2. Vitreous Hemorrhage OS -- improving  - s/p IVA OS #1  (06.22.22)  - onset overnight and noted upon waking (6.22.22)  - unclear etiology -- hemorrhagic PVD v diabetic hemorrhage  - no RT/RD on 360 peripheral examination  - b-scan 6.29.22 without obvious RT/RD or mass  - central VH fading and settling inferiorly  - BCVA improved to 20/20- from 20/40 - VH precautions reviewed -- minimize activities, keep head elevated, avoid ASA/NSAIDs/blood thinners as able             - recommend IVA OS #2, 07.27.22 - RBA of procedure discussed, questions answered - informed consent obtained and signed - see procedure note  - may start back walking for exercise - f/u 2 wk -- DFE/OCT, possible repeat FA  3. Moderate nonproliferative diabetic retinopathy w/o DME, both eyes  - OS: with dense VH - FA 06.22.22 w/ scattered leaking MA -- no obvious NV, but OS with significant blockage from [facility] - OCT without diabetic macular edema, both eyes  - monitor  4,5. Hypertensive retinopathy OU - discussed importance of tight BP control - monitor  6. Pseudophakia OU  - s/p CE/IOL OU  - IOLs in good position, doing well  - monitor  Ophthalmic Meds Ordered this visit:  Meds ordered this encounter  Medications   Bevacizumab (AVASTIN) SOLN 1.25 mg        Return in about 2 weeks (around 02/18/2021) for 2 weeks VH OS DFE/OCT.  There are no Patient Instructions on file for this visit.   Explained the diagnoses, plan, and follow up with the patient and they expressed understanding.  Patient expressed understanding of the importance of proper follow up care.    This document serves as a record of services personally performed by Gardiner Sleeper, MD, PhD. It was created on their behalf by Roselee Nova, COMT. The creation of this record is the provider's dictation and/or activities during the visit.  Electronically signed by: Roselee Nova, COMT 02/04/21 9:09 AM   This document serves as a record of services personally performed by Gardiner Sleeper, MD, PhD. It was  created on their behalf by Leeann Must, [place], an ophthalmic technician. The creation of this record is the provider's dictation and/or activities during the visit.    Electronically signed by: Leeann Must, COA '@TODAY'$ @ 9:09 AM  Gardiner Sleeper, M.D., Ph.D. Diseases & Surgery of the Retina and Vitreous [facility]  I have reviewed the above documentation for accuracy and completeness, and I agree with the above. Gardiner Sleeper, M.D., Ph.D. 02/04/21 9:09 AM  Abbreviations: M myopia (nearsighted); A astigmatism; H hyperopia (farsighted); P presbyopia; Mrx spectacle prescription;  CTL contact lenses; OD right eye; OS left eye; OU both eyes  XT exotropia; ET esotropia; PEK punctate epithelial keratitis; PEE punctate  epithelial erosions; DES dry eye syndrome; MGD meibomian gland dysfunction; ATs artificial tears; PFAT's preservative free artificial tears; [place] nuclear sclerotic cataract; PSC posterior subcapsular cataract; ERM epi-retinal membrane; PVD posterior vitreous detachment; RD retinal detachment; DM diabetes mellitus; DR diabetic retinopathy; NPDR non-proliferative diabetic retinopathy; PDR proliferative diabetic retinopathy; CSME clinically significant macular edema; DME diabetic macular edema; dbh dot blot hemorrhages; CWS cotton wool spot; POAG primary open angle glaucoma; C/D cup-to-disc ratio; HVF humphrey visual field; GVF goldmann visual field; OCT optical coherence tomography; IOP intraocular pressure; BRVO Branch retinal vein occlusion; CRVO central retinal vein occlusion; CRAO central retinal artery occlusion; BRAO branch retinal artery occlusion; RT retinal tear; SB scleral buckle; PPV pars plana vitrectomy; VH Vitreous hemorrhage; PRP panretinal laser photocoagulation; IVK intravitreal kenalog; VMT vitreomacular traction; MH Macular hole;  NVD neovascularization of the disc; NVE neovascularization elsewhere; AREDS age related eye disease study; ARMD age related  macular degeneration; POAG primary open angle glaucoma; EBMD epithelial/anterior basement membrane dystrophy; ACIOL anterior chamber intraocular lens; IOL intraocular lens; PCIOL posterior chamber intraocular lens; Phaco/IOL phacoemulsification with intraocular lens placement; [place] photorefractive keratectomy; LASIK laser assisted in situ keratomileusis; HTN hypertension; DM diabetes mellitus; COPD chronic obstructive pulmonary disease

## 2021-02-04 ENCOUNTER — Encounter (INDEPENDENT_AMBULATORY_CARE_PROVIDER_SITE_OTHER): Payer: Self-pay | Admitting: Ophthalmology

## 2021-02-04 ENCOUNTER — Ambulatory Visit (INDEPENDENT_AMBULATORY_CARE_PROVIDER_SITE_OTHER): Payer: Medicare Other | Admitting: Ophthalmology

## 2021-02-04 ENCOUNTER — Other Ambulatory Visit: Payer: Self-pay

## 2021-02-04 DIAGNOSIS — E113393 Type 2 diabetes mellitus with moderate nonproliferative diabetic retinopathy without macular edema, bilateral: Secondary | ICD-10-CM

## 2021-02-04 DIAGNOSIS — Z961 Presence of intraocular lens: Secondary | ICD-10-CM

## 2021-02-04 DIAGNOSIS — H35033 Hypertensive retinopathy, bilateral: Secondary | ICD-10-CM | POA: Diagnosis not present

## 2021-02-04 DIAGNOSIS — H4312 Vitreous hemorrhage, left eye: Secondary | ICD-10-CM

## 2021-02-04 DIAGNOSIS — I10 Essential (primary) hypertension: Secondary | ICD-10-CM

## 2021-02-04 DIAGNOSIS — H3581 Retinal edema: Secondary | ICD-10-CM

## 2021-02-04 MED ORDER — BEVACIZUMAB CHEMO INJECTION 1.25MG/0.05ML SYRINGE FOR KALEIDOSCOPE
1.2500 mg | INTRAVITREAL | Status: AC | PRN
  Administered 2021-02-04: 1.25 mg via INTRAVITREAL

## 2021-02-16 NOTE — Progress Notes (Signed)
[facility] Note  02/18/2021     CHIEF COMPLAINT Patient presents for Retina Follow Up   HISTORY OF PRESENT ILLNESS: Jamie Mays is a 70 y.o. female who presents to the clinic today for:   HPI     Retina Follow Up   Patient presents with  Other.  In left eye.  This started 2 weeks ago.  I, the attending physician,  performed the HPI with the patient and updated documentation appropriately.        Comments   Patient here for 2 weeks retina follow up for VH OS. Patient states vision doing much better. Very little eye pain.       Last edited by Bernarda Caffey, MD on 02/18/2021  8:18 AM.      Referring physician: Reynold Bowen, MD [place],  [place] [id number]  HISTORICAL INFORMATION:   Selected notes from the MEDICAL RECORD NUMBER Referred by Dr. Herbert Deaner for acute [facility] OS LEE:  Ocular Hx- PMH-    CURRENT MEDICATIONS: Current Outpatient Medications (Ophthalmic Drugs)  Medication Sig   cycloSPORINE (RESTASIS) 0.05 % ophthalmic emulsion Place 1 drop into both eyes 2 (two) times daily.   No current facility-administered medications for this visit. (Ophthalmic Drugs)   Current Outpatient Medications (Other)  Medication Sig   acetaminophen (TYLENOL) 500 MG tablet Take 1,000 mg by mouth every 8 (eight) hours as needed for moderate pain.   ALPRAZolam (XANAX) 0.5 MG tablet take one by mouth prn anxiety   calcium carbonate (TUMS - DOSED IN MG ELEMENTAL CALCIUM) 500 MG chewable tablet 1 tablet   esomeprazole (NEXIUM) 20 MG capsule 1 capsule   glucose blood test strip 1 each by Other route 6 (six) times daily. Use as instructed    hydrochlorothiazide (MICROZIDE) 12.5 MG capsule Take 12.5 mg by mouth daily.   ibuprofen (ADVIL) 200 MG tablet Take 200-800 mg by mouth every 6 (six) hours as needed for moderate pain.   insulin aspart (NOVOLOG) 100 UNIT/ML injection Inject 75-100 Units into the skin daily. Via insulin pump as directed    irbesartan (AVAPRO) 150 MG tablet Take 150 mg by mouth daily.   levothyroxine (SYNTHROID, LEVOTHROID) 75 MCG tablet Take 1 tablet (75 mcg total) by mouth daily. (Patient taking differently: Take 75 mcg by mouth See admin instructions. Take 75 mg on Mon, Tue, Thurs, Fri, and Sat. Skip Sun and Wed dose)   Magnesium 200 MG CHEW Chew 300 mg by mouth daily.   minocycline (MINOCIN) 100 MG capsule Take 100 mg by mouth daily.   Omega-3 Fatty Acids (FISH OIL ADULT GUMMIES PO) Take 1 capsule by mouth daily.    propranolol (INDERAL) 20 MG tablet TAKE 1 TABLET (20 MG TOTAL) BY MOUTH 3 (THREE) TIMES DAILY AS NEEDED. (Patient taking differently: Take 10-20 mg by mouth 3 (three) times daily as needed (palpitations).)   Turmeric 500 MG CAPS Take 500 mg by mouth daily.   Vitamin D, Ergocalciferol, (DRISDOL) 1.25 MG (50000 UT) CAPS capsule Take 50,000 Units by mouth every Tuesday.    No current facility-administered medications for this visit. (Other)      REVIEW OF SYSTEMS: ROS   Positive for: Musculoskeletal, Endocrine, Eyes Negative for: Constitutional, Gastrointestinal, Neurological, Skin, Genitourinary, HENT, Cardiovascular, Respiratory, Psychiatric, Allergic/Imm, Heme/Lymph Last edited by Theodore Demark, COA on 02/18/2021  7:50 AM.      ALLERGIES Allergies  Allergen Reactions   Doxycycline Other (See Comments)    Joint pain  Lisinopril Cough   Oxycodone     Hallucinations     PAST MEDICAL HISTORY Past Medical History:  Diagnosis Date   Ankle fracture    left   Arthritis    Diabetes mellitus    Family history of adverse reaction to anesthesia    " they had difficulty waking my dad up; he was about 23-32 years old."   Family history of MI (myocardial infarction)    GERD (gastroesophageal reflux disease)    Hypertension    Hypothyroidism    Painful orthopaedic hardware ([place])    PVC (premature ventricular contraction)    Smoker    former  " quit about 40 years ago"   Syncope     Wears glasses    Past Surgical History:  Procedure Laterality Date   CARDIAC CATHETERIZATION  2007   neg   CATARACT EXTRACTION     CATARACT EXTRACTION W/ INTRAOCULAR LENS  IMPLANT, BILATERAL     CESAREAN SECTION     EYE SURGERY     HARDWARE REMOVAL Left 03/19/2020   Procedure: HARDWARE REMOVAL LEFT ANKLE;  Surgeon: Shona Needles, MD;  Location: [place];  Service: Orthopedics;  Laterality: Left;   ORIF ANKLE FRACTURE Left 07/19/2019   Procedure: OPEN REDUCTION INTERNAL FIXATION (ORIF) ANKLE FRACTURE;  Surgeon: Shona Needles, MD;  Location: [place];  Service: Orthopedics;  Laterality: Left;    FAMILY HISTORY Family History  Problem Relation Age of Onset   Coronary artery disease Mother    Kidney disease Mother    Diabetes Mother    Heart disease Mother        heart attack   Hypertension Mother    Macular degeneration Father    Diabetes Sister    Diabetes Brother    Cancer Brother        lung cancer   Cancer Brother 11       lungs, brain, chest wall, liver cancer, stage 4   Diabetes Maternal Grandmother    Cancer Other 54       lung , brain    SOCIAL HISTORY Social History   Tobacco Use   Smoking status: Former    Types: Cigarettes    Quit date: 07/12/1981    Years since quitting: 39.6   Smokeless tobacco: Never  Vaping Use   Vaping Use: Never used  Substance Use Topics   Alcohol use: Not Currently    Alcohol/week: 0.0 standard drinks    Comment: very rare   Drug use: No         OPHTHALMIC EXAM:  Base Eye Exam     Visual Acuity (Snellen - Linear)       Right Left   Dist cc 20/25 -2 20/25 +1   Dist ph cc 20/20 -1 20/25 +2    Correction: Glasses         Tonometry (Tonopen, 7:48 AM)       Right Left   Pressure 14 14         Pupils       Dark Light Shape React APD   Right 4 3 Round Brisk None   Left 4 3 Round Brisk None         Visual Fields (Counting fingers)       Left Right    Full Full         Extraocular Movement        Right Left    Full Full  Neuro/Psych     Oriented x3: Yes   Mood/Affect: Normal         Dilation     Left eye: 1.0% Mydriacyl, 2.5% Phenylephrine @ 7:48 AM  Patient wanted only OS dilated.          Slit Lamp and Fundus Exam     Slit Lamp Exam       Right Left   Lids/Lashes Dermatochalasis - upper lid Dermatochalasis - upper lid   Conjunctiva/Sclera White and quiet temporal pinguecula   Cornea mild arcus, EBMD, 1-2+ Punctate epithelial erosions, Keratic precipitates Clear   Anterior Chamber deep, clear, narrow temporal angle Deep and clear   Iris Round and reactive Round and dilated, No NVI   Lens PC IOL in good position PC IOL in good position   Vitreous Vitreous syneresis, Posterior vitreous detachment Vitreous syneresis, PVD, +RBC, white blood stained vitreous condensations settling inferiorly, clearing centrally         Fundus Exam       Right Left   Disc Pink and Sharp Mild pallor, sharp rim   C/D Ratio 0.3 0.3   Macula Flat, Good foveal reflex, RPE mottling, No heme or edema Flat, blunted foveal reflex, mild RPE mottling   Vessels mild attenuation, mild tortuousity mild attenuation, mild tortuousity   Periphery Attached, rare MA and DBH    Inferior periphery obscured by blood clots, otherwise attached, inf/temp blot heme, no RT/RD           Refraction     Wearing Rx       Sphere Cylinder Axis Add   Right -0.50 +0.50 153 +2.50   Left -0.25 +1.50 160 +2.50    Type: PAL            IMAGING AND PROCEDURES  Imaging and Procedures for 02/18/2021  OCT, Retina - OU - Both Eyes       Right Eye Quality was good. Central Foveal Thickness: 270. Progression has been stable. Findings include normal foveal contour, no IRF, no SRF.   Left Eye Quality was poor. Central Foveal Thickness: 283. Progression has been stable. Findings include no IRF, no SRF, normal foveal contour (Mild Interval improvement in vitreous opacities).   Notes *Images  captured and stored on drive  Diagnosis / Impression:  OD: NFP, no IRF/SRF OS: Mild interval improvement in vitreous opacities  Clinical management:  See below  Abbreviations: NFP - Normal foveal profile. CME - cystoid macular edema. PED - pigment epithelial detachment. IRF - intraretinal fluid. SRF - subretinal fluid. EZ - ellipsoid zone. ERM - epiretinal membrane. ORA - outer retinal atrophy. ORT - outer retinal tubulation. SRHM - subretinal hyper-reflective material. IRHM - intraretinal hyper-reflective material            ASSESSMENT/PLAN:    ICD-10-CM   1. Vitreous hemorrhage of left eye (HCC)  H43.12     2. Retinal edema  H35.81 OCT, Retina - OU - Both Eyes    3. Moderate nonproliferative diabetic retinopathy of both eyes without macular edema associated with type 2 diabetes mellitus ([place])  [id number]     4. Essential hypertension  I10     5. Hypertensive retinopathy of both eyes  H35.033     6. Pseudophakia, both eyes  Z96.1      1,2. Vitreous Hemorrhage OS -- improving  - s/p IVA OS #1 (06.22.22), #2 (07.27.22)  - onset overnight and noted upon waking (6.22.22)  - unclear etiology -- hemorrhagic PVD v diabetic hemorrhage  -  no RT/RD on 360 peripheral examination  - b-scan 6.29.22 without obvious RT/RD or mass  - central VH fading and settling inferiorly  - BCVA improved to 20/25+ from 20/40 - VH precautions reviewed -- minimize activities, keep head elevated, avoid ASA/NSAIDs/blood thinners as able - may start back walking for exercise - f/u 2 wk -- DFE/OCT, possible injection, possible repeat FA  3. Moderate nonproliferative diabetic retinopathy w/o DME, both eyes  - OS: with dense VH - FA 06.22.22 w/ scattered leaking MA -- no obvious NV, but OS with significant blockage from [facility] - OCT without diabetic macular edema, both eyes   - monitor  4,5. Hypertensive retinopathy OU - discussed importance of tight BP control - monitor  6. Pseudophakia OU  - s/p  CE/IOL OU  - IOLs in good position, doing well  - monitor  Ophthalmic Meds Ordered this visit:  No orders of the defined types were placed in this encounter.     Return in about 2 weeks (around 03/04/2021) for 2 weeks VH OS, DFE/OCT/Repeat FA (transit OS).  There are no Patient Instructions on file for this visit.   Explained the diagnoses, plan, and follow up with the patient and they expressed understanding.  Patient expressed understanding of the importance of proper follow up care.    This document serves as a record of services personally performed by Gardiner Sleeper, MD, PhD. It was created on their behalf by Roselee Nova, COMT. The creation of this record is the provider's dictation and/or activities during the visit.  Electronically signed by: Roselee Nova, COMT 02/18/21 8:23 AM  Gardiner Sleeper, M.D., Ph.D. Diseases & Surgery of the Retina and Vitreous [facility]  I have reviewed the above documentation for accuracy and completeness, and I agree with the above. Gardiner Sleeper, M.D., Ph.D. 02/18/21 8:28 AM   Abbreviations: M myopia (nearsighted); A astigmatism; H hyperopia (farsighted); P presbyopia; Mrx spectacle prescription;  CTL contact lenses; OD right eye; OS left eye; OU both eyes  XT exotropia; ET esotropia; PEK punctate epithelial keratitis; PEE punctate epithelial erosions; DES dry eye syndrome; MGD meibomian gland dysfunction; ATs artificial tears; PFAT's preservative free artificial tears; [place] nuclear sclerotic cataract; PSC posterior subcapsular cataract; ERM epi-retinal membrane; PVD posterior vitreous detachment; RD retinal detachment; DM diabetes mellitus; DR diabetic retinopathy; NPDR non-proliferative diabetic retinopathy; PDR proliferative diabetic retinopathy; CSME clinically significant macular edema; DME diabetic macular edema; dbh dot blot hemorrhages; CWS cotton wool spot; POAG primary open angle glaucoma; C/D cup-to-disc ratio; HVF  humphrey visual field; GVF goldmann visual field; OCT optical coherence tomography; IOP intraocular pressure; BRVO Branch retinal vein occlusion; CRVO central retinal vein occlusion; CRAO central retinal artery occlusion; BRAO branch retinal artery occlusion; RT retinal tear; SB scleral buckle; PPV pars plana vitrectomy; VH Vitreous hemorrhage; PRP panretinal laser photocoagulation; IVK intravitreal kenalog; VMT vitreomacular traction; MH Macular hole;  NVD neovascularization of the disc; NVE neovascularization elsewhere; AREDS age related eye disease study; ARMD age related macular degeneration; POAG primary open angle glaucoma; EBMD epithelial/anterior basement membrane dystrophy; ACIOL anterior chamber intraocular lens; IOL intraocular lens; PCIOL posterior chamber intraocular lens; Phaco/IOL phacoemulsification with intraocular lens placement; [place] photorefractive keratectomy; LASIK laser assisted in situ keratomileusis; HTN hypertension; DM diabetes mellitus; COPD chronic obstructive pulmonary disease

## 2021-02-18 ENCOUNTER — Encounter (INDEPENDENT_AMBULATORY_CARE_PROVIDER_SITE_OTHER): Payer: Self-pay | Admitting: Ophthalmology

## 2021-02-18 ENCOUNTER — Other Ambulatory Visit: Payer: Self-pay

## 2021-02-18 ENCOUNTER — Ambulatory Visit (INDEPENDENT_AMBULATORY_CARE_PROVIDER_SITE_OTHER): Payer: Medicare Other | Admitting: Ophthalmology

## 2021-02-18 DIAGNOSIS — H4312 Vitreous hemorrhage, left eye: Secondary | ICD-10-CM

## 2021-02-18 DIAGNOSIS — Z961 Presence of intraocular lens: Secondary | ICD-10-CM

## 2021-02-18 DIAGNOSIS — H3581 Retinal edema: Secondary | ICD-10-CM | POA: Diagnosis not present

## 2021-02-18 DIAGNOSIS — H35033 Hypertensive retinopathy, bilateral: Secondary | ICD-10-CM

## 2021-02-18 DIAGNOSIS — E113393 Type 2 diabetes mellitus with moderate nonproliferative diabetic retinopathy without macular edema, bilateral: Secondary | ICD-10-CM | POA: Diagnosis not present

## 2021-02-18 DIAGNOSIS — I10 Essential (primary) hypertension: Secondary | ICD-10-CM

## 2021-02-26 NOTE — Progress Notes (Signed)
[facility] Note  03/04/2021     CHIEF COMPLAINT Patient presents for Retina Follow Up   HISTORY OF PRESENT ILLNESS: Jamie Mays is a 70 y.o. female who presents to the clinic today for:   HPI     Retina Follow Up   Patient presents with  Other.  In left eye.  Duration of 2 weeks.  Since onset it is gradually improving.  I, the attending physician,  performed the HPI with the patient and updated documentation appropriately.        Comments   2 week follow up Vit heme OS- Heme has much improved OS.  Only seeing 2 little strings hanging down.  Vision has improved as well. Refresh prn OU. BS 99 this morning A1C 6.0      Last edited by Bernarda Caffey, MD on 03/04/2021  8:49 AM.       Referring physician: Reynold Bowen, MD [place],   [id number]  HISTORICAL INFORMATION:   Selected notes from the MEDICAL RECORD NUMBER Referred by Dr. Herbert Deaner for acute [facility] OS LEE:  Ocular Hx- PMH-    CURRENT MEDICATIONS: Current Outpatient Medications (Ophthalmic Drugs)  Medication Sig   cycloSPORINE (RESTASIS) 0.05 % ophthalmic emulsion Place 1 drop into both eyes 2 (two) times daily. (Patient not taking: Reported on 03/04/2021)   No current facility-administered medications for this visit. (Ophthalmic Drugs)   Current Outpatient Medications (Other)  Medication Sig   acetaminophen (TYLENOL) 500 MG tablet Take 1,000 mg by mouth every 8 (eight) hours as needed for moderate pain.   ALPRAZolam (XANAX) 0.5 MG tablet take one by mouth prn anxiety   calcium carbonate (TUMS - DOSED IN MG ELEMENTAL CALCIUM) 500 MG chewable tablet 1 tablet   esomeprazole (NEXIUM) 20 MG capsule 1 capsule   hydrochlorothiazide (MICROZIDE) 12.5 MG capsule Take 12.5 mg by mouth daily.   ibuprofen (ADVIL) 200 MG tablet Take 200-800 mg by mouth every 6 (six) hours as needed for moderate pain.   insulin aspart (NOVOLOG) 100 UNIT/ML injection Inject 75-100 Units into  the skin daily. Via insulin pump as directed   irbesartan (AVAPRO) 150 MG tablet Take 150 mg by mouth daily.   levothyroxine (SYNTHROID, LEVOTHROID) 75 MCG tablet Take 1 tablet (75 mcg total) by mouth daily. (Patient taking differently: Take 75 mcg by mouth See admin instructions. Take 75 mg on Mon, Tue, Thurs, Fri, and Sat. Skip Sun and Wed dose)   Magnesium 200 MG CHEW Chew 300 mg by mouth daily.   minocycline (MINOCIN) 100 MG capsule Take 100 mg by mouth daily.   Omega-3 Fatty Acids (FISH OIL ADULT GUMMIES PO) Take 1 capsule by mouth daily.    propranolol (INDERAL) 20 MG tablet TAKE 1 TABLET (20 MG TOTAL) BY MOUTH 3 (THREE) TIMES DAILY AS NEEDED. (Patient taking differently: Take 10-20 mg by mouth 3 (three) times daily as needed (palpitations).)   Turmeric 500 MG CAPS Take 500 mg by mouth daily.   Vitamin D, Ergocalciferol, (DRISDOL) 1.25 MG (50000 UT) CAPS capsule Take 50,000 Units by mouth every Tuesday.    glucose blood test strip 1 each by Other route 6 (six) times daily. Use as instructed    No current facility-administered medications for this visit. (Other)      REVIEW OF SYSTEMS: ROS   Positive for: Musculoskeletal, Endocrine, Eyes Negative for: Constitutional, Gastrointestinal, Neurological, Skin, Genitourinary, HENT, Cardiovascular, Respiratory, Psychiatric, Allergic/Imm, Heme/Lymph Last edited by Leonie Douglas, COA on  03/04/2021  8:15 AM.       ALLERGIES Allergies  Allergen Reactions   Doxycycline Other (See Comments)    Joint pain   Lisinopril Cough   Oxycodone     Hallucinations     PAST MEDICAL HISTORY Past Medical History:  Diagnosis Date   Ankle fracture    left   Arthritis    Diabetes mellitus    Family history of adverse reaction to anesthesia    " they had difficulty waking my dad up; he was about 58-56 years old."   Family history of MI (myocardial infarction)    GERD (gastroesophageal reflux disease)    Hypertension    Hypothyroidism    Painful  orthopaedic hardware ([place])    PVC (premature ventricular contraction)    Smoker    former  " quit about 40 years ago"   Syncope    Wears glasses    Past Surgical History:  Procedure Laterality Date   CARDIAC CATHETERIZATION  2007   neg   CATARACT EXTRACTION     CATARACT EXTRACTION W/ INTRAOCULAR LENS  IMPLANT, BILATERAL     CESAREAN SECTION     EYE SURGERY     HARDWARE REMOVAL Left 03/19/2020   Procedure: HARDWARE REMOVAL LEFT ANKLE;  Surgeon: Shona Needles, MD;  Location: [facility];  Service: Orthopedics;  Laterality: Left;   ORIF ANKLE FRACTURE Left 07/19/2019   Procedure: OPEN REDUCTION INTERNAL FIXATION (ORIF) ANKLE FRACTURE;  Surgeon: Shona Needles, MD;  Location: [facility];  Service: Orthopedics;  Laterality: Left;    FAMILY HISTORY Family History  Problem Relation Age of Onset   Coronary artery disease Mother    Kidney disease Mother    Diabetes Mother    Heart disease Mother        heart attack   Hypertension Mother    Macular degeneration Father    Diabetes Sister    Diabetes Brother    Cancer Brother        lung cancer   Cancer Brother 86       lungs, brain, chest wall, liver cancer, stage 4   Diabetes Maternal Grandmother    Cancer Other 110       lung , brain    SOCIAL HISTORY Social History   Tobacco Use   Smoking status: Former    Types: Cigarettes    Quit date: 07/12/1981    Years since quitting: 39.6   Smokeless tobacco: Never  Vaping Use   Vaping Use: Never used  Substance Use Topics   Alcohol use: Not Currently    Alcohol/week: 0.0 standard drinks    Comment: very rare   Drug use: No         OPHTHALMIC EXAM:  Base Eye Exam     Visual Acuity (Snellen - Linear)       Right Left   Dist cc 20/25 +2 20/20 -2   Dist ph cc 20/20 -1          Tonometry (Tonopen, 8:21 AM)       Right Left   Pressure 12 12         Pupils       Dark Light Shape React APD   Right 4 3 Round Brisk None   Left 4 3 Round Brisk None         Visual  Fields (Counting fingers)       Left Right    Full Full  Extraocular Movement       Right Left    Full Full         Neuro/Psych     Oriented x3: Yes   Mood/Affect: Normal         Dilation     Both eyes: 1.0% Mydriacyl, 2.5% Phenylephrine @ 8:21 AM           Slit Lamp and Fundus Exam     Slit Lamp Exam       Right Left   Lids/Lashes Dermatochalasis - upper lid Dermatochalasis - upper lid   Conjunctiva/Sclera White and quiet temporal pinguecula   Cornea mild arcus, EBMD, trace Punctate epithelial erosions Mild arcus, EBMD, 1-2+ Punctate epithelial erosions   Anterior Chamber deep, clear, narrow temporal angle Deep and clear   Iris Round and reactive Round and dilated, No NVI   Lens PC IOL in good position PC IOL in good position   Vitreous Vitreous syneresis, Posterior vitreous detachment Vitreous syneresis, PVD, +RBC, white blood stained vitreous condensations settling inferiorly -- improving, clearing centrally         Fundus Exam       Right Left   Disc Pink and Sharp Mild pallor, sharp rim   C/D Ratio 0.3 0.3   Macula Flat, Good foveal reflex, RPE mottling, No heme or edema Flat, blunted foveal reflex, mild RPE mottling   Vessels mild attenuation, mild tortuousity, mild Copper wiring mild attenuation, mild tortuousity   Periphery Attached, rare MA and DBH    Inferior periphery obscured by blood clots, otherwise attached, inf/temp blot heme -- improved, no RT/RD           Refraction     Wearing Rx       Sphere Cylinder Axis Add   Right -0.50 +0.50 153 +2.50   Left -0.25 +1.50 160 +2.50    Type: PAL            IMAGING AND PROCEDURES  Imaging and Procedures for 03/04/2021  OCT, Retina - OU - Both Eyes       Right Eye Quality was good. Central Foveal Thickness: 269. Progression has been stable. Findings include normal foveal contour, no IRF, no SRF (Trace macular ERM).   Left Eye Quality was poor. Central Foveal Thickness:  285. Progression has improved. Findings include no IRF, no SRF, normal foveal contour (Mild Interval improvement in vitreous opacities).   Notes *Images captured and stored on drive  Diagnosis / Impression:  OD: Trace macular ERM OS: Mild interval improvement in vitreous opacities  Clinical management:  See below  Abbreviations: NFP - Normal foveal profile. CME - cystoid macular edema. PED - pigment epithelial detachment. IRF - intraretinal fluid. SRF - subretinal fluid. EZ - ellipsoid zone. ERM - epiretinal membrane. ORA - outer retinal atrophy. ORT - outer retinal tubulation. SRHM - subretinal hyper-reflective material. IRHM - intraretinal hyper-reflective material      Fluorescein Angiography Optos (Transit OS)       Right Eye Progression has been stable. Early phase findings include microaneurysm, vascular perfusion defect. Mid/Late phase findings include microaneurysm, leakage (Mild leakage from scatterd MA's, no NV).   Left Eye Progression has improved. Early phase findings include blockage, vascular perfusion defect, microaneurysm (Interval improvement in blockage/ VH compared to 06.22.22 FA). Mid/Late phase findings include blockage, microaneurysm, leakage, vascular perfusion defect (No NV).   Notes **Images stored on drive**  Impression: Moderate NPDR OU Late leaking MA's OU No NV OU OS - Interval improvement in blockage/  VH compared to 06.22.22 FA             ASSESSMENT/PLAN:    ICD-10-CM   1. Vitreous hemorrhage of left eye (HCC)  H43.12 Fluorescein Angiography Optos (Transit OS)    2. Retinal edema  H35.81 OCT, Retina - OU - Both Eyes    3. Moderate nonproliferative diabetic retinopathy of both eyes without macular edema associated with type 2 diabetes mellitus ([place])  [id number]     4. Essential hypertension  I10     5. Hypertensive retinopathy of both eyes  H35.033 Fluorescein Angiography Optos (Transit OS)    6. Pseudophakia, both eyes  Z96.1        1,2. Vitreous Hemorrhage OS -- improving  - s/p IVA OS #1 (06.22.22), #2 (07.27.22)  - onset overnight and noted upon waking (6.22.22)  - unclear etiology -- hemorrhagic PVD v diabetic hemorrhage  - no RT/RD on 360 peripheral examination  - b-scan 6.29.22 without RT/RD or mass  - central VH fading and settling inferiorly  - BCVA improved to 20/20- from 20/40  - FA 8.24.22 shows interval improvement in blockage/ VH compared to 06.22.22 FA; no NV OU - VH precautions reviewed -- minimize activities, keep head elevated, avoid ASA/NSAIDs/blood thinners as able - may start back walking for exercise and driving - discussed possible preventative benefit of PRP laser after injection #3 OS -- to areas of peripheral nonperfusion - f/u 2 wk -- DFE/OCT, possible injection  3. Moderate nonproliferative diabetic retinopathy w/o DME, both eyes  - OS: with dense VH, improving  - FA 06.22.22 w/ scattered leaking MA -- no obvious NV, but OS with significant blockage from [facility] - Repeat FA 08.24.22 OS Interval improvement in blockage/ VH compared to 06.22.22 FA; no NV OU - OCT without diabetic macular edema, both eyes   - monitor  4,5. Hypertensive retinopathy OU - discussed importance of tight BP control - monitor  6. Pseudophakia OU  - s/p CE/IOL OU  - IOLs in good position, doing well  - monitor  Ophthalmic Meds Ordered this visit:  No orders of the defined types were placed in this encounter.     Return for 2 week DFE, OCT, possible injection.  There are no Patient Instructions on file for this visit.  This document serves as a record of services personally performed by Gardiner Sleeper, MD, PhD. It was created on their behalf by Orvan Falconer, an ophthalmic technician. The creation of this record is the provider's dictation and/or activities during the visit.    Electronically signed by: Orvan Falconer, OA, 03/04/21  9:20 AM   This document serves as a record of services personally  performed by Gardiner Sleeper, MD, PhD. It was created on their behalf by Leonie Douglas, an ophthalmic technician. The creation of this record is the provider's dictation and/or activities during the visit.    Electronically signed by: Leonie Douglas COA, 03/04/21  9:20 AM  Gardiner Sleeper, M.D., Ph.D. Diseases & Surgery of the Retina and [place] 03/04/2021   I have reviewed the above documentation for accuracy and completeness, and I agree with the above. Gardiner Sleeper, M.D., Ph.D. 03/04/21 9:20 AM  Abbreviations: M myopia (nearsighted); A astigmatism; H hyperopia (farsighted); P presbyopia; Mrx spectacle prescription;  CTL contact lenses; OD right eye; OS left eye; OU both eyes  XT exotropia; ET esotropia; PEK punctate epithelial keratitis; PEE punctate epithelial erosions; DES dry eye syndrome; MGD meibomian gland  dysfunction; ATs artificial tears; PFAT's preservative free artificial tears; [place] nuclear sclerotic cataract; PSC posterior subcapsular cataract; ERM epi-retinal membrane; PVD posterior vitreous detachment; RD retinal detachment; DM diabetes mellitus; DR diabetic retinopathy; NPDR non-proliferative diabetic retinopathy; PDR proliferative diabetic retinopathy; CSME clinically significant macular edema; DME diabetic macular edema; dbh dot blot hemorrhages; CWS cotton wool spot; POAG primary open angle glaucoma; C/D cup-to-disc ratio; HVF humphrey visual field; GVF goldmann visual field; OCT optical coherence tomography; IOP intraocular pressure; BRVO Branch retinal vein occlusion; CRVO central retinal vein occlusion; CRAO central retinal artery occlusion; BRAO branch retinal artery occlusion; RT retinal tear; SB scleral buckle; PPV pars plana vitrectomy; VH Vitreous hemorrhage; PRP panretinal laser photocoagulation; IVK intravitreal kenalog; VMT vitreomacular traction; MH Macular hole;  NVD neovascularization of the disc; NVE neovascularization elsewhere;  AREDS age related eye disease study; ARMD age related macular degeneration; POAG primary open angle glaucoma; EBMD epithelial/anterior basement membrane dystrophy; ACIOL anterior chamber intraocular lens; IOL intraocular lens; PCIOL posterior chamber intraocular lens; Phaco/IOL phacoemulsification with intraocular lens placement; [place] photorefractive keratectomy; LASIK laser assisted in situ keratomileusis; HTN hypertension; DM diabetes mellitus; COPD chronic obstructive pulmonary disease

## 2021-03-04 ENCOUNTER — Encounter (INDEPENDENT_AMBULATORY_CARE_PROVIDER_SITE_OTHER): Payer: Self-pay | Admitting: Ophthalmology

## 2021-03-04 ENCOUNTER — Ambulatory Visit (INDEPENDENT_AMBULATORY_CARE_PROVIDER_SITE_OTHER): Payer: Medicare Other | Admitting: Ophthalmology

## 2021-03-04 ENCOUNTER — Other Ambulatory Visit: Payer: Self-pay

## 2021-03-04 DIAGNOSIS — I10 Essential (primary) hypertension: Secondary | ICD-10-CM | POA: Diagnosis not present

## 2021-03-04 DIAGNOSIS — H4312 Vitreous hemorrhage, left eye: Secondary | ICD-10-CM

## 2021-03-04 DIAGNOSIS — H35033 Hypertensive retinopathy, bilateral: Secondary | ICD-10-CM | POA: Diagnosis not present

## 2021-03-04 DIAGNOSIS — E113393 Type 2 diabetes mellitus with moderate nonproliferative diabetic retinopathy without macular edema, bilateral: Secondary | ICD-10-CM

## 2021-03-04 DIAGNOSIS — H3581 Retinal edema: Secondary | ICD-10-CM

## 2021-03-04 DIAGNOSIS — Z961 Presence of intraocular lens: Secondary | ICD-10-CM | POA: Diagnosis not present

## 2021-03-17 DIAGNOSIS — S82852D Displaced trimalleolar fracture of left lower leg, subsequent encounter for closed fracture with routine healing: Secondary | ICD-10-CM | POA: Diagnosis not present

## 2021-03-17 DIAGNOSIS — S93432D Sprain of tibiofibular ligament of left ankle, subsequent encounter: Secondary | ICD-10-CM | POA: Diagnosis not present

## 2021-03-18 ENCOUNTER — Encounter (INDEPENDENT_AMBULATORY_CARE_PROVIDER_SITE_OTHER): Payer: Medicare Other | Admitting: Ophthalmology

## 2021-03-18 ENCOUNTER — Encounter (INDEPENDENT_AMBULATORY_CARE_PROVIDER_SITE_OTHER): Payer: Self-pay

## 2021-03-18 DIAGNOSIS — E113393 Type 2 diabetes mellitus with moderate nonproliferative diabetic retinopathy without macular edema, bilateral: Secondary | ICD-10-CM

## 2021-03-18 DIAGNOSIS — H35033 Hypertensive retinopathy, bilateral: Secondary | ICD-10-CM

## 2021-03-18 DIAGNOSIS — I10 Essential (primary) hypertension: Secondary | ICD-10-CM

## 2021-03-18 DIAGNOSIS — H3581 Retinal edema: Secondary | ICD-10-CM

## 2021-03-18 DIAGNOSIS — Z961 Presence of intraocular lens: Secondary | ICD-10-CM

## 2021-03-18 DIAGNOSIS — H4312 Vitreous hemorrhage, left eye: Secondary | ICD-10-CM

## 2021-03-25 ENCOUNTER — Ambulatory Visit (INDEPENDENT_AMBULATORY_CARE_PROVIDER_SITE_OTHER): Payer: Medicare Other | Admitting: Ophthalmology

## 2021-03-25 ENCOUNTER — Other Ambulatory Visit: Payer: Self-pay

## 2021-03-25 DIAGNOSIS — H35033 Hypertensive retinopathy, bilateral: Secondary | ICD-10-CM | POA: Diagnosis not present

## 2021-03-25 DIAGNOSIS — I10 Essential (primary) hypertension: Secondary | ICD-10-CM | POA: Diagnosis not present

## 2021-03-25 DIAGNOSIS — E113393 Type 2 diabetes mellitus with moderate nonproliferative diabetic retinopathy without macular edema, bilateral: Secondary | ICD-10-CM | POA: Diagnosis not present

## 2021-03-25 DIAGNOSIS — Z961 Presence of intraocular lens: Secondary | ICD-10-CM

## 2021-03-25 DIAGNOSIS — H4312 Vitreous hemorrhage, left eye: Secondary | ICD-10-CM | POA: Diagnosis not present

## 2021-03-25 DIAGNOSIS — H3581 Retinal edema: Secondary | ICD-10-CM

## 2021-03-25 NOTE — Progress Notes (Signed)
[facility] Note  03/25/2021     CHIEF COMPLAINT Patient presents for Retina Follow Up   HISTORY OF PRESENT ILLNESS: Jamie Mays is a 70 y.o. female who presents to the clinic today for:   HPI     Retina Follow Up   Patient presents with  Other.  In left eye.  Severity is mild.  Duration of 3 weeks.  Since onset it is gradually improving.  I, the attending physician,  performed the HPI with the patient and updated documentation appropriately.        Comments   Pt here for 3 wk ret f/u vit heme OS. Pt reports vision is getting better. No changes or issues, no ocular pain or discomfort. Blood sugar this am was 95.       Last edited by Bernarda Caffey, MD on 03/27/2021  1:04 PM.    Pt states vision is doing "really well"  Referring physician: Reynold Bowen, MD [place],  [place] [id number]  HISTORICAL INFORMATION:   Selected notes from the MEDICAL RECORD NUMBER Referred by Dr. Herbert Deaner for acute [facility] OS LEE:  Ocular Hx- PMH-    CURRENT MEDICATIONS: Current Outpatient Medications (Ophthalmic Drugs)  Medication Sig   cycloSPORINE (RESTASIS) 0.05 % ophthalmic emulsion Place 1 drop into both eyes 2 (two) times daily. (Patient not taking: Reported on 03/04/2021)   No current facility-administered medications for this visit. (Ophthalmic Drugs)   Current Outpatient Medications (Other)  Medication Sig   acetaminophen (TYLENOL) 500 MG tablet Take 1,000 mg by mouth every 8 (eight) hours as needed for moderate pain.   ALPRAZolam (XANAX) 0.5 MG tablet take one by mouth prn anxiety   calcium carbonate (TUMS - DOSED IN MG ELEMENTAL CALCIUM) 500 MG chewable tablet 1 tablet   esomeprazole (NEXIUM) 20 MG capsule 1 capsule   glucose blood test strip 1 each by Other route 6 (six) times daily. Use as instructed    hydrochlorothiazide (MICROZIDE) 12.5 MG capsule Take 12.5 mg by mouth daily.   ibuprofen (ADVIL) 200 MG tablet Take 200-800 mg by mouth  every 6 (six) hours as needed for moderate pain.   insulin aspart (NOVOLOG) 100 UNIT/ML injection Inject 75-100 Units into the skin daily. Via insulin pump as directed   irbesartan (AVAPRO) 150 MG tablet Take 150 mg by mouth daily.   levothyroxine (SYNTHROID, LEVOTHROID) 75 MCG tablet Take 1 tablet (75 mcg total) by mouth daily. (Patient taking differently: Take 75 mcg by mouth See admin instructions. Take 75 mg on Mon, Tue, Thurs, Fri, and Sat. Skip Sun and Wed dose)   Magnesium 200 MG CHEW Chew 300 mg by mouth daily.   minocycline (MINOCIN) 100 MG capsule Take 100 mg by mouth daily.   Omega-3 Fatty Acids (FISH OIL ADULT GUMMIES PO) Take 1 capsule by mouth daily.    propranolol (INDERAL) 20 MG tablet TAKE 1 TABLET (20 MG TOTAL) BY MOUTH 3 (THREE) TIMES DAILY AS NEEDED. (Patient taking differently: Take 10-20 mg by mouth 3 (three) times daily as needed (palpitations).)   Turmeric 500 MG CAPS Take 500 mg by mouth daily.   Vitamin D, Ergocalciferol, (DRISDOL) 1.25 MG (50000 UT) CAPS capsule Take 50,000 Units by mouth every Tuesday.    No current facility-administered medications for this visit. (Other)   REVIEW OF SYSTEMS: ROS   Positive for: Musculoskeletal, Endocrine, Eyes Negative for: Constitutional, Gastrointestinal, Neurological, Skin, Genitourinary, HENT, Cardiovascular, Respiratory, Psychiatric, Allergic/Imm, Heme/Lymph Last edited by Moshe Cipro,  Deatra James, COT on 03/25/2021  8:13 AM.     ALLERGIES Allergies  Allergen Reactions   Doxycycline Other (See Comments)    Joint pain   Lisinopril Cough   Oxycodone     Hallucinations     PAST MEDICAL HISTORY Past Medical History:  Diagnosis Date   Ankle fracture    left   Arthritis    Diabetes mellitus    Family history of adverse reaction to anesthesia    " they had difficulty waking my dad up; he was about 71-87 years old."   Family history of MI (myocardial infarction)    GERD (gastroesophageal reflux disease)    Hypertension     Hypothyroidism    Painful orthopaedic hardware ([place])    PVC (premature ventricular contraction)    Smoker    former  " quit about 40 years ago"   Syncope    Wears glasses    Past Surgical History:  Procedure Laterality Date   CARDIAC CATHETERIZATION  2007   neg   CATARACT EXTRACTION     CATARACT EXTRACTION W/ INTRAOCULAR LENS  IMPLANT, BILATERAL     CESAREAN SECTION     EYE SURGERY     HARDWARE REMOVAL Left 03/19/2020   Procedure: HARDWARE REMOVAL LEFT ANKLE;  Surgeon: Shona Needles, MD;  Location: [place];  Service: Orthopedics;  Laterality: Left;   ORIF ANKLE FRACTURE Left 07/19/2019   Procedure: OPEN REDUCTION INTERNAL FIXATION (ORIF) ANKLE FRACTURE;  Surgeon: Shona Needles, MD;  Location: [place];  Service: Orthopedics;  Laterality: Left;    FAMILY HISTORY Family History  Problem Relation Age of Onset   Coronary artery disease Mother    Kidney disease Mother    Diabetes Mother    Heart disease Mother        heart attack   Hypertension Mother    Macular degeneration Father    Diabetes Sister    Diabetes Brother    Cancer Brother        lung cancer   Cancer Brother 28       lungs, brain, chest wall, liver cancer, stage 4   Diabetes Maternal Grandmother    Cancer Other 13       lung , brain    SOCIAL HISTORY Social History   Tobacco Use   Smoking status: Former    Types: Cigarettes    Quit date: 07/12/1981    Years since quitting: 39.7   Smokeless tobacco: Never  Vaping Use   Vaping Use: Never used  Substance Use Topics   Alcohol use: Not Currently    Alcohol/week: 0.0 standard drinks    Comment: very rare   Drug use: No         OPHTHALMIC EXAM:  Base Eye Exam     Visual Acuity (Snellen - Linear)       Right Left   Dist cc 20/25 +1 20/25 +1   Dist ph cc 20/20 -2 NI    Correction: Glasses         Tonometry (Tonopen, 8:21 AM)       Right Left   Pressure 14 11         Pupils       Dark Light Shape React APD   Right 4 3 Round  Brisk None   Left 4 3 Round Brisk None         Visual Fields (Counting fingers)       Left Right    Full  Full         Extraocular Movement       Right Left    Full, Ortho Full, Ortho         Neuro/Psych     Oriented x3: Yes   Mood/Affect: Normal         Dilation     Both eyes: 1.0% Mydriacyl, 2.5% Phenylephrine @ 8:21 AM           Slit Lamp and Fundus Exam     Slit Lamp Exam       Right Left   Lids/Lashes Dermatochalasis - upper lid Dermatochalasis - upper lid   Conjunctiva/Sclera White and quiet temporal pinguecula   Cornea mild arcus, EBMD, trace Punctate epithelial erosions Mild arcus, EBMD, 1+ Punctate epithelial erosions   Anterior Chamber deep, clear, narrow temporal angle Deep and clear   Iris Round and reactive Round and dilated, No NVI   Lens PC IOL in good position PC IOL in good position   Vitreous Vitreous syneresis, Posterior vitreous detachment Vitreous syneresis, PVD, +RBC - improving, white blood stained vitreous condensations settling inferiorly -- improving, clearing centrally         Fundus Exam       Right Left   Disc Pink and Sharp Mild pallor, sharp rim, PPP   C/D Ratio 0.3 0.2   Macula Flat, Good foveal reflex, RPE mottling, No heme or edema Flat, blunted foveal reflex, mild RPE mottling, No heme or edema   Vessels mild attenuation, mild tortuousity, mild Copper wiring mild attenuation, mild tortuousity   Periphery Attached, rare MA and DBH    Inferior periphery obscured by blood clots, otherwise attached, inf/temp blot heme -- improved, no RT/RD           Refraction     Wearing Rx       Sphere Cylinder Axis Add   Right -0.50 +0.50 153 +2.50   Left -0.25 +1.50 160 +2.50    Type: PAL            IMAGING AND PROCEDURES  Imaging and Procedures for 03/25/2021  OCT, Retina - OU - Both Eyes       Right Eye Quality was good. Central Foveal Thickness: 269. Progression has been stable. Findings include normal  foveal contour, no IRF, no SRF (Trace ERM).   Left Eye Quality was poor. Central Foveal Thickness: 279. Progression has improved. Findings include no IRF, no SRF, normal foveal contour (Mild Interval improvement in vitreous opacities).   Notes *Images captured and stored on drive  Diagnosis / Impression:  OD: Trace macular ERM OS: Mild interval improvement in vitreous opacities  Clinical management:  See below  Abbreviations: NFP - Normal foveal profile. CME - cystoid macular edema. PED - pigment epithelial detachment. IRF - intraretinal fluid. SRF - subretinal fluid. EZ - ellipsoid zone. ERM - epiretinal membrane. ORA - outer retinal atrophy. ORT - outer retinal tubulation. SRHM - subretinal hyper-reflective material. IRHM - intraretinal hyper-reflective material      Intravitreal Injection, Pharmacologic Agent - OS - Left Eye       Time Out 03/25/2021. 8:39 AM. Confirmed correct patient, procedure, site, and patient consented.   Anesthesia Topical anesthesia was used. Anesthetic medications included Lidocaine 2%, Proparacaine 0.5%.   Procedure Preparation included 5% betadine to ocular surface, eyelid speculum. A supplied needle was used.   Injection: 1.25 mg Bevacizumab 1.71m/0.05ml   Route: Intravitreal, Site: Left Eye   NDC:: 51025-852-77 Lot: [id number]' , Expiration date: 04/08/2021, Waste:  0 mL   Post-op Post injection exam found visual acuity of at least counting fingers. The patient tolerated the procedure well. There were no complications. The patient received written and verbal post procedure care education.            ASSESSMENT/PLAN:    ICD-10-CM   1. Vitreous hemorrhage of left eye (HCC)  H43.12 Intravitreal Injection, Pharmacologic Agent - OS - Left Eye    Bevacizumab (AVASTIN) SOLN 1.25 mg    2. Retinal edema  H35.81 OCT, Retina - OU - Both Eyes    3. Moderate nonproliferative diabetic retinopathy of both eyes without macular edema associated with  type 2 diabetes mellitus (HCC)  O75.6433 Intravitreal Injection, Pharmacologic Agent - OS - Left Eye    Bevacizumab (AVASTIN) SOLN 1.25 mg    4. Essential hypertension  I10     5. Hypertensive retinopathy of both eyes  H35.033     6. Pseudophakia, both eyes  Z96.1      1,2. Vitreous Hemorrhage OS -- improving  - s/p IVA OS #1 (06.22.22), #2 (07.27.22)  - onset overnight and noted upon waking (6.22.22)  - unclear etiology -- hemorrhagic PVD v diabetic hemorrhage  - no RT/RD on 360 peripheral examination  - b-scan 6.29.22 without RT/RD or mass  - central VH fading and settling inferiorly  - BCVA improved to 20/20- from 20/40  - FA 8.24.22 shows interval improvement in blockage/ VH compared to 06.22.22 FA; no NV OU  - recommend IVA OS #3 today, 9.14.22 - RBA of procedure discussed, questions answered - informed consent obtained and signed - see procedure note - VH precautions reviewed -- minimize activities, keep head elevated, avoid ASA/NSAIDs/blood thinners as able - may start back walking for exercise and driving - discussed possible preventative benefit of PRP laser after injection #3 OS -- to areas of peripheral nonperfusion - f/u 4 wks -- DFE/OCT, possible injection  3. Moderate nonproliferative diabetic retinopathy w/o DME, both eyes  - OS: with dense VH, improving  - FA 06.22.22 w/ scattered leaking MA -- no obvious NV, but OS with significant blockage from [facility] - Repeat FA 08.24.22 OS Interval improvement in blockage/ VH compared to 06.22.22 FA; no NV OU - OCT without diabetic macular edema, both eyes   - monitor  4,5. Hypertensive retinopathy OU - discussed importance of tight BP control - monitor  6. Pseudophakia OU  - s/p CE/IOL OU  - IOLs in good position, doing well  - monitor  Ophthalmic Meds Ordered this visit:  Meds ordered this encounter  Medications   Bevacizumab (AVASTIN) SOLN 1.25 mg      Return in about 4 weeks (around 04/22/2021) for VH OS, Dilated  Exam, OCT, Possible Injxn.  There are no Patient Instructions on file for this visit.  This document serves as a record of services personally performed by Gardiner Sleeper, MD, PhD. It was created on their behalf by San Jetty. Owens Shark, OA an ophthalmic technician. The creation of this record is the provider's dictation and/or activities during the visit.    Electronically signed by: San Jetty. Owens Shark, [place] 09.14.2022 1:08 PM  Gardiner Sleeper, M.D., Ph.D. Diseases & Surgery of the Retina and Vitreous [facility]  I have reviewed the above documentation for accuracy and completeness, and I agree with the above. Gardiner Sleeper, M.D., Ph.D. 03/27/21 1:14 PM  Abbreviations: M myopia (nearsighted); A astigmatism; H hyperopia (farsighted); P presbyopia; Mrx spectacle prescription;  CTL  contact lenses; OD right eye; OS left eye; OU both eyes  XT exotropia; ET esotropia; PEK punctate epithelial keratitis; PEE punctate epithelial erosions; DES dry eye syndrome; MGD meibomian gland dysfunction; ATs artificial tears; PFAT's preservative free artificial tears; [place] nuclear sclerotic cataract; PSC posterior subcapsular cataract; ERM epi-retinal membrane; PVD posterior vitreous detachment; RD retinal detachment; DM diabetes mellitus; DR diabetic retinopathy; NPDR non-proliferative diabetic retinopathy; PDR proliferative diabetic retinopathy; CSME clinically significant macular edema; DME diabetic macular edema; dbh dot blot hemorrhages; CWS cotton wool spot; POAG primary open angle glaucoma; C/D cup-to-disc ratio; HVF humphrey visual field; GVF goldmann visual field; OCT optical coherence tomography; IOP intraocular pressure; BRVO Branch retinal vein occlusion; CRVO central retinal vein occlusion; CRAO central retinal artery occlusion; BRAO branch retinal artery occlusion; RT retinal tear; SB scleral buckle; PPV pars plana vitrectomy; VH Vitreous hemorrhage; PRP panretinal laser photocoagulation; IVK  intravitreal kenalog; VMT vitreomacular traction; MH Macular hole;  NVD neovascularization of the disc; NVE neovascularization elsewhere; AREDS age related eye disease study; ARMD age related macular degeneration; POAG primary open angle glaucoma; EBMD epithelial/anterior basement membrane dystrophy; ACIOL anterior chamber intraocular lens; IOL intraocular lens; PCIOL posterior chamber intraocular lens; Phaco/IOL phacoemulsification with intraocular lens placement; [place] photorefractive keratectomy; LASIK laser assisted in situ keratomileusis; HTN hypertension; DM diabetes mellitus; COPD chronic obstructive pulmonary disease

## 2021-03-27 ENCOUNTER — Encounter (INDEPENDENT_AMBULATORY_CARE_PROVIDER_SITE_OTHER): Payer: Self-pay | Admitting: Ophthalmology

## 2021-03-27 MED ORDER — BEVACIZUMAB CHEMO INJECTION 1.25MG/0.05ML SYRINGE FOR KALEIDOSCOPE
1.2500 mg | INTRAVITREAL | Status: AC | PRN
  Administered 2021-03-25: 1.25 mg via INTRAVITREAL

## 2021-04-03 DIAGNOSIS — E10319 Type 1 diabetes mellitus with unspecified diabetic retinopathy without macular edema: Secondary | ICD-10-CM | POA: Diagnosis not present

## 2021-04-03 DIAGNOSIS — I10 Essential (primary) hypertension: Secondary | ICD-10-CM | POA: Diagnosis not present

## 2021-04-03 DIAGNOSIS — E039 Hypothyroidism, unspecified: Secondary | ICD-10-CM | POA: Diagnosis not present

## 2021-04-03 DIAGNOSIS — K760 Fatty (change of) liver, not elsewhere classified: Secondary | ICD-10-CM | POA: Diagnosis not present

## 2021-04-03 DIAGNOSIS — E559 Vitamin D deficiency, unspecified: Secondary | ICD-10-CM | POA: Diagnosis not present

## 2021-04-03 DIAGNOSIS — K219 Gastro-esophageal reflux disease without esophagitis: Secondary | ICD-10-CM | POA: Diagnosis not present

## 2021-04-03 DIAGNOSIS — E042 Nontoxic multinodular goiter: Secondary | ICD-10-CM | POA: Diagnosis not present

## 2021-04-03 DIAGNOSIS — H3581 Retinal edema: Secondary | ICD-10-CM | POA: Diagnosis not present

## 2021-04-03 DIAGNOSIS — Z23 Encounter for immunization: Secondary | ICD-10-CM | POA: Diagnosis not present

## 2021-04-03 DIAGNOSIS — H4312 Vitreous hemorrhage, left eye: Secondary | ICD-10-CM | POA: Diagnosis not present

## 2021-04-03 DIAGNOSIS — E785 Hyperlipidemia, unspecified: Secondary | ICD-10-CM | POA: Diagnosis not present

## 2021-04-16 NOTE — Progress Notes (Signed)
[facility] Note  04/22/2021     CHIEF COMPLAINT Patient presents for Retina Follow Up   HISTORY OF PRESENT ILLNESS: Jamie Mays is a 70 y.o. female who presents to the clinic today for:   HPI     Retina Follow Up   Patient presents with  Other.  In left eye.  This started months ago.  Severity is mild.  Duration of 4 weeks.  Since onset it is gradually improving.  I, the attending physician,  performed the HPI with the patient and updated documentation appropriately.        Comments   70 y/o female pt here for 4 wk f/u for vit heme OS.  VA OS continues to gradually improve.  No change in [place] OD.  Denies pain, FOL, floaters.  AT prn OU.  BS 133 this a.m.  A1C 5.9.      Last edited by Bernarda Caffey, MD on 04/22/2021  8:56 AM.    Pt states floaters are still there, but they are smaller and don't really bother her anymore  Referring physician: Reynold Bowen, MD [place],  [place] [id number]  HISTORICAL INFORMATION:  Selected notes from the MEDICAL RECORD NUMBER Referred by Dr. Herbert Deaner for acute [facility] OS LEE:  Ocular Hx- PMH-    CURRENT MEDICATIONS: Current Outpatient Medications (Ophthalmic Drugs)  Medication Sig   cycloSPORINE (RESTASIS) 0.05 % ophthalmic emulsion Place 1 drop into both eyes 2 (two) times daily. (Patient not taking: Reported on 04/22/2021)   No current facility-administered medications for this visit. (Ophthalmic Drugs)   Current Outpatient Medications (Other)  Medication Sig   acetaminophen (TYLENOL) 500 MG tablet Take 1,000 mg by mouth every 8 (eight) hours as needed for moderate pain.   ALPRAZolam (XANAX) 0.5 MG tablet take one by mouth prn anxiety   calcium carbonate (TUMS - DOSED IN MG ELEMENTAL CALCIUM) 500 MG chewable tablet 1 tablet   esomeprazole (NEXIUM) 20 MG capsule 1 capsule   glucose blood test strip 1 each by Other route 6 (six) times daily. Use as instructed    hydrochlorothiazide (MICROZIDE)  12.5 MG capsule Take 12.5 mg by mouth daily.   ibuprofen (ADVIL) 200 MG tablet Take 200-800 mg by mouth every 6 (six) hours as needed for moderate pain.   insulin aspart (NOVOLOG) 100 UNIT/ML injection Inject 75-100 Units into the skin daily. Via insulin pump as directed   irbesartan (AVAPRO) 150 MG tablet Take 150 mg by mouth daily.   levothyroxine (SYNTHROID, LEVOTHROID) 75 MCG tablet Take 1 tablet (75 mcg total) by mouth daily. (Patient taking differently: Take 75 mcg by mouth See admin instructions. Take 75 mg on Mon, Tue, Thurs, Fri, and Sat. Skip Sun and Wed dose)   Magnesium 200 MG CHEW Chew 300 mg by mouth daily.   methocarbamol (ROBAXIN) 500 MG tablet Take 500 mg by mouth every 8 (eight) hours as needed.   minocycline (MINOCIN) 100 MG capsule Take 100 mg by mouth daily.   Omega-3 Fatty Acids (FISH OIL ADULT GUMMIES PO) Take 1 capsule by mouth daily.    propranolol (INDERAL) 20 MG tablet TAKE 1 TABLET (20 MG TOTAL) BY MOUTH 3 (THREE) TIMES DAILY AS NEEDED. (Patient taking differently: Take 10-20 mg by mouth 3 (three) times daily as needed (palpitations).)   Turmeric 500 MG CAPS Take 500 mg by mouth daily.   Vitamin D, Ergocalciferol, (DRISDOL) 1.25 MG (50000 UT) CAPS capsule Take 50,000 Units by mouth every  Tuesday.    No current facility-administered medications for this visit. (Other)   REVIEW OF SYSTEMS: ROS   Positive for: Endocrine, Eyes Negative for: Constitutional, Gastrointestinal, Neurological, Skin, Genitourinary, Musculoskeletal, HENT, Cardiovascular, Respiratory, Psychiatric, Allergic/Imm, Heme/Lymph Last edited by Matthew Folks, COA on 04/22/2021  8:04 AM.     ALLERGIES Allergies  Allergen Reactions   Doxycycline Other (See Comments)    Joint pain   Lisinopril Cough   Oxycodone     Hallucinations    PAST MEDICAL HISTORY Past Medical History:  Diagnosis Date   Ankle fracture    left   Arthritis    Diabetes mellitus    Diabetic retinopathy ([place])    Family  history of adverse reaction to anesthesia    " they had difficulty waking my dad up; he was about 76-38 years old."   Family history of MI (myocardial infarction)    GERD (gastroesophageal reflux disease)    Hypertension    Hypertensive retinopathy    Hypothyroidism    Painful orthopaedic hardware ([place])    PVC (premature ventricular contraction)    Smoker    former  " quit about 40 years ago"   Syncope    Wears glasses    Past Surgical History:  Procedure Laterality Date   CARDIAC CATHETERIZATION  2007   neg   CATARACT EXTRACTION     CATARACT EXTRACTION W/ INTRAOCULAR LENS  IMPLANT, BILATERAL     CESAREAN SECTION     EYE SURGERY     HARDWARE REMOVAL Left 03/19/2020   Procedure: HARDWARE REMOVAL LEFT ANKLE;  Surgeon: Shona Needles, MD;  Location: [facility];  Service: Orthopedics;  Laterality: Left;   ORIF ANKLE FRACTURE Left 07/19/2019   Procedure: OPEN REDUCTION INTERNAL FIXATION (ORIF) ANKLE FRACTURE;  Surgeon: Shona Needles, MD;  Location: [facility];  Service: Orthopedics;  Laterality: Left;   FAMILY HISTORY Family History  Problem Relation Age of Onset   Coronary artery disease Mother    Kidney disease Mother    Diabetes Mother    Heart disease Mother        heart attack   Hypertension Mother    Macular degeneration Father    Diabetes Sister    Diabetes Brother    Cancer Brother        lung cancer   Cancer Brother 6       lungs, brain, chest wall, liver cancer, stage 4   Diabetes Maternal Grandmother    Cancer Other 12       lung , brain    SOCIAL HISTORY Social History   Tobacco Use   Smoking status: Former    Types: Cigarettes    Quit date: 07/12/1981    Years since quitting: 39.8   Smokeless tobacco: Never  Vaping Use   Vaping Use: Never used  Substance Use Topics   Alcohol use: Not Currently    Alcohol/week: 0.0 standard drinks    Comment: very rare   Drug use: No       OPHTHALMIC EXAM:  Base Eye Exam     Visual Acuity (Snellen - Linear)        Right Left   Dist cc 20/25 -2 20/25 -2   Dist ph cc 20/20 -2 20/20 -2    Correction: Glasses         Tonometry (Tonopen, 8:08 AM)       Right Left   Pressure 13 14         Pupils  Dark Light Shape React APD   Right 4 3 Round Brisk None   Left 4 3 Round Brisk None         Visual Fields (Counting fingers)       Left Right    Full Full         Extraocular Movement       Right Left    Full, Ortho Full, Ortho         Neuro/Psych     Oriented x3: Yes   Mood/Affect: Normal         Dilation     Both eyes: 1.0% Mydriacyl, 2.5% Phenylephrine @ 8:08 AM           Slit Lamp and Fundus Exam     Slit Lamp Exam       Right Left   Lids/Lashes Dermatochalasis - upper lid Dermatochalasis - upper lid   Conjunctiva/Sclera White and quiet temporal pinguecula   Cornea mild arcus, EBMD, trace Punctate epithelial erosions Mild arcus, EBMD, 1+ Punctate epithelial erosions   Anterior Chamber deep, clear, narrow temporal angle Deep and clear   Iris Round and reactive Round and dilated, No NVI   Lens PC IOL in good position PC IOL in good position   Vitreous Vitreous syneresis, Posterior vitreous detachment Vitreous syneresis, PVD, +RBC - improving, white blood stained vitreous condensations settling inferiorly -- improving, clearing centrally         Fundus Exam       Right Left   Disc Pink and Sharp Mild pallor, sharp rim, PPP   C/D Ratio 0.3 0.2   Macula Flat, Good foveal reflex, RPE mottling, No heme or edema Flat, blunted foveal reflex, mild RPE mottling, rare MA, No edema   Vessels mild attenuation, mild tortuousity, mild Copper wiring mild attenuation, mild tortuousity   Periphery Attached, rare MA and DBH    Inferior periphery obscured by blood clots, otherwise attached, scattered MA/DBH, no RT/RD            IMAGING AND PROCEDURES  Imaging and Procedures for 04/22/2021  OCT, Retina - OU - Both Eyes       Right Eye Quality was good.  Central Foveal Thickness: 273. Progression has been stable. Findings include normal foveal contour, no IRF, no SRF (Trace ERM).   Left Eye Quality was good. Central Foveal Thickness: 278. Progression has improved. Findings include no IRF, no SRF, normal foveal contour (Interval improvement in vitreous opacities).   Notes *Images captured and stored on drive  Diagnosis / Impression:  OD: Trace macular ERM OS: Mild interval improvement in vitreous opacities  Clinical management:  See below  Abbreviations: NFP - Normal foveal profile. CME - cystoid macular edema. PED - pigment epithelial detachment. IRF - intraretinal fluid. SRF - subretinal fluid. EZ - ellipsoid zone. ERM - epiretinal membrane. ORA - outer retinal atrophy. ORT - outer retinal tubulation. SRHM - subretinal hyper-reflective material. IRHM - intraretinal hyper-reflective material            ASSESSMENT/PLAN:    ICD-10-CM   1. Vitreous hemorrhage of left eye (HCC)  H43.12     2. Retinal edema  H35.81 OCT, Retina - OU - Both Eyes    3. Moderate nonproliferative diabetic retinopathy of both eyes without macular edema associated with type 2 diabetes mellitus ([place])  P53.6144     4. Essential hypertension  I10     5. Hypertensive retinopathy of both eyes  H35.033     6. Pseudophakia,  both eyes  Z96.1     1,2. Vitreous Hemorrhage OS -- improving  - s/p IVA OS #1 (06.22.22), #2 (07.27.22), #3 (09.14.22)  - onset overnight and noted upon waking (6.22.22)  - unclear etiology -- hemorrhagic PVD v diabetic hemorrhage  - no RT/RD on 360 peripheral examination  - b-scan 6.29.22 without RT/RD or mass  - central VH fading and settling inferiorly  - BCVA stable at 20/20  - FA 8.24.22 shows interval improvement in blockage/ VH compared to 06.22.22 FA; no NV OU  - pt wishes to hold off on IVA today - VH precautions reviewed -- minimize activities, keep head elevated, avoid ASA/NSAIDs/blood thinners as able - pt has restarted  walking for exercise and driving without issue - discussed possible preventative benefit of PRP laser after injection #3 OS -- to areas of peripheral nonperfusion - f/u 8 wks -- DFE/OCT, possible injection  3. Moderate nonproliferative diabetic retinopathy w/o DME, both eyes  - OS: with dense VH, improving  - FA 06.22.22 w/ scattered leaking MA -- no obvious NV, but OS with significant blockage from [facility] - Repeat FA 08.24.22 OS Interval improvement in blockage/ VH compared to 06.22.22 FA; no NV OU - OCT without diabetic macular edema, both eyes   - monitor  4,5. Hypertensive retinopathy OU - discussed importance of tight BP control - monitor  6. Pseudophakia OU  - s/p CE/IOL OU  - IOLs in good position, doing well  - monitor  Ophthalmic Meds Ordered this visit:  No orders of the defined types were placed in this encounter.     Return in about 8 weeks (around 06/17/2021) for f/u VH OS, DFE, OCT.  There are no Patient Instructions on file for this visit.  This document serves as a record of services personally performed by Gardiner Sleeper, MD, PhD. It was created on their behalf by Orvan Falconer, an ophthalmic technician. The creation of this record is the provider's dictation and/or activities during the visit.    Electronically signed by: Orvan Falconer, OA, 04/22/21  9:00 AM   This document serves as a record of services personally performed by Gardiner Sleeper, MD, PhD. It was created on their behalf by San Jetty. Owens Shark, OA an ophthalmic technician. The creation of this record is the provider's dictation and/or activities during the visit.    Electronically signed by: San Jetty. Owens Shark, [place] 10.12.2022 9:00 AM   Gardiner Sleeper, M.D., Ph.D. Diseases & Surgery of the Retina and Vitreous [facility]  I have reviewed the above documentation for accuracy and completeness, and I agree with the above. Gardiner Sleeper, M.D., Ph.D. 04/22/21 9:00  AM  Abbreviations: M myopia (nearsighted); A astigmatism; H hyperopia (farsighted); P presbyopia; Mrx spectacle prescription;  CTL contact lenses; OD right eye; OS left eye; OU both eyes  XT exotropia; ET esotropia; PEK punctate epithelial keratitis; PEE punctate epithelial erosions; DES dry eye syndrome; MGD meibomian gland dysfunction; ATs artificial tears; PFAT's preservative free artificial tears; [place] nuclear sclerotic cataract; PSC posterior subcapsular cataract; ERM epi-retinal membrane; PVD posterior vitreous detachment; RD retinal detachment; DM diabetes mellitus; DR diabetic retinopathy; NPDR non-proliferative diabetic retinopathy; PDR proliferative diabetic retinopathy; CSME clinically significant macular edema; DME diabetic macular edema; dbh dot blot hemorrhages; CWS cotton wool spot; POAG primary open angle glaucoma; C/D cup-to-disc ratio; HVF humphrey visual field; GVF goldmann visual field; OCT optical coherence tomography; IOP intraocular pressure; BRVO Branch retinal vein occlusion; CRVO central retinal vein occlusion;  CRAO central retinal artery occlusion; BRAO branch retinal artery occlusion; RT retinal tear; SB scleral buckle; PPV pars plana vitrectomy; VH Vitreous hemorrhage; PRP panretinal laser photocoagulation; IVK intravitreal kenalog; VMT vitreomacular traction; MH Macular hole;  NVD neovascularization of the disc; NVE neovascularization elsewhere; AREDS age related eye disease study; ARMD age related macular degeneration; POAG primary open angle glaucoma; EBMD epithelial/anterior basement membrane dystrophy; ACIOL anterior chamber intraocular lens; IOL intraocular lens; PCIOL posterior chamber intraocular lens; Phaco/IOL phacoemulsification with intraocular lens placement; [place] photorefractive keratectomy; LASIK laser assisted in situ keratomileusis; HTN hypertension; DM diabetes mellitus; COPD chronic obstructive pulmonary disease

## 2021-04-22 ENCOUNTER — Encounter (INDEPENDENT_AMBULATORY_CARE_PROVIDER_SITE_OTHER): Payer: Self-pay | Admitting: Ophthalmology

## 2021-04-22 ENCOUNTER — Other Ambulatory Visit: Payer: Self-pay

## 2021-04-22 ENCOUNTER — Ambulatory Visit (INDEPENDENT_AMBULATORY_CARE_PROVIDER_SITE_OTHER): Payer: Medicare Other | Admitting: Ophthalmology

## 2021-04-22 DIAGNOSIS — H35033 Hypertensive retinopathy, bilateral: Secondary | ICD-10-CM

## 2021-04-22 DIAGNOSIS — H3581 Retinal edema: Secondary | ICD-10-CM

## 2021-04-22 DIAGNOSIS — I10 Essential (primary) hypertension: Secondary | ICD-10-CM | POA: Diagnosis not present

## 2021-04-22 DIAGNOSIS — Z961 Presence of intraocular lens: Secondary | ICD-10-CM | POA: Diagnosis not present

## 2021-04-22 DIAGNOSIS — E113393 Type 2 diabetes mellitus with moderate nonproliferative diabetic retinopathy without macular edema, bilateral: Secondary | ICD-10-CM | POA: Diagnosis not present

## 2021-04-22 DIAGNOSIS — H4312 Vitreous hemorrhage, left eye: Secondary | ICD-10-CM

## 2021-05-15 DIAGNOSIS — H40013 Open angle with borderline findings, low risk, bilateral: Secondary | ICD-10-CM | POA: Diagnosis not present

## 2021-06-03 DIAGNOSIS — L718 Other rosacea: Secondary | ICD-10-CM | POA: Diagnosis not present

## 2021-06-03 DIAGNOSIS — L821 Other seborrheic keratosis: Secondary | ICD-10-CM | POA: Diagnosis not present

## 2021-06-09 DIAGNOSIS — M720 Palmar fascial fibromatosis [Dupuytren]: Secondary | ICD-10-CM | POA: Diagnosis not present

## 2021-06-09 DIAGNOSIS — M79644 Pain in right finger(s): Secondary | ICD-10-CM | POA: Diagnosis not present

## 2021-06-09 DIAGNOSIS — M79645 Pain in left finger(s): Secondary | ICD-10-CM | POA: Diagnosis not present

## 2021-06-12 NOTE — Progress Notes (Signed)
[facility] Note  06/17/2021     CHIEF COMPLAINT Patient presents for Retina Follow Up   HISTORY OF PRESENT ILLNESS: Jamie Mays is a 70 y.o. female who presents to the clinic today for:   HPI     Retina Follow Up   Patient presents with  Other.  In left eye.  This started months ago.  Severity is mild.  Duration of 8 weeks.  Since onset it is stable.  I, the attending physician,  performed the HPI with the patient and updated documentation appropriately.        Comments   70 y/o female pt here for 8 wk f/u for VH OS.  No change in [place] OU.  Denies pain, FOL, floaters.  No gtts.  BS 89 this a.m.  A1C 8.9.      Last edited by Bernarda Caffey, MD on 06/17/2021  9:14 AM.    Pt states floaters are persistent, but smaller  Referring physician: Reynold Bowen, MD [place],  [place] [address]  HISTORICAL INFORMATION:  Selected notes from the MEDICAL RECORD NUMBER Referred by Dr. Herbert Deaner for acute [facility] OS LEE:  Ocular Hx- PMH-    CURRENT MEDICATIONS: Current Outpatient Medications (Ophthalmic Drugs)  Medication Sig   cycloSPORINE (RESTASIS) 0.05 % ophthalmic emulsion Place 1 drop into both eyes 2 (two) times daily. (Patient not taking: Reported on 06/17/2021)   XIIDRA 5 % SOLN Apply 1 drop to eye 2 (two) times daily. (Patient not taking: Reported on 06/17/2021)   No current facility-administered medications for this visit. (Ophthalmic Drugs)   Current Outpatient Medications (Other)  Medication Sig   acetaminophen (TYLENOL) 500 MG tablet Take 1,000 mg by mouth every 8 (eight) hours as needed for moderate pain.   ALPRAZolam (XANAX) 0.5 MG tablet take one by mouth prn anxiety   calcium carbonate (TUMS - DOSED IN MG ELEMENTAL CALCIUM) 500 MG chewable tablet 1 tablet   Cephalexin 500 MG tablet Take 1 tablet po 2x a day x 1 week-Rx replaces Rx of Doxycycline previously sent into Pharmacy yesterday   doxycycline (VIBRA-TABS) 100 MG tablet Take  1 tablet po 2x a day x 1 week   esomeprazole (NEXIUM) 20 MG capsule 1 capsule   glucose blood test strip 1 each by Other route 6 (six) times daily. Use as instructed    hydrochlorothiazide (MICROZIDE) 12.5 MG capsule Take 12.5 mg by mouth daily.   ibuprofen (ADVIL) 200 MG tablet Take 200-800 mg by mouth every 6 (six) hours as needed for moderate pain.   insulin aspart (NOVOLOG) 100 UNIT/ML injection Inject 75-100 Units into the skin daily. Via insulin pump as directed   irbesartan (AVAPRO) 150 MG tablet Take 150 mg by mouth daily.   levothyroxine (SYNTHROID, LEVOTHROID) 75 MCG tablet Take 1 tablet (75 mcg total) by mouth daily. (Patient taking differently: Take 75 mcg by mouth See admin instructions. Take 75 mg on Mon, Tue, Thurs, Fri, and Sat. Skip Sun and Wed dose)   Magnesium 200 MG CHEW Chew 300 mg by mouth daily.   methocarbamol (ROBAXIN) 500 MG tablet Take 500 mg by mouth every 8 (eight) hours as needed.   minocycline (MINOCIN) 100 MG capsule Take 100 mg by mouth daily.   Omega-3 Fatty Acids (FISH OIL ADULT GUMMIES PO) Take 1 capsule by mouth daily.    propranolol (INDERAL) 20 MG tablet TAKE 1 TABLET (20 MG TOTAL) BY MOUTH 3 (THREE) TIMES DAILY AS NEEDED. (Patient  taking differently: Take 10-20 mg by mouth 3 (three) times daily as needed (palpitations).)   Turmeric 500 MG CAPS Take 500 mg by mouth daily.   Vitamin D, Ergocalciferol, (DRISDOL) 1.25 MG (50000 UT) CAPS capsule Take 50,000 Units by mouth every Tuesday.    No current facility-administered medications for this visit. (Other)   REVIEW OF SYSTEMS: ROS   Positive for: Musculoskeletal, Endocrine, Eyes Negative for: Constitutional, Gastrointestinal, Neurological, Skin, Genitourinary, HENT, Cardiovascular, Respiratory, Psychiatric, Allergic/Imm, Heme/Lymph Last edited by Matthew Folks, COA on 06/17/2021  8:02 AM.      ALLERGIES Allergies  Allergen Reactions   Doxycycline Other (See Comments)    Joint pain   Lisinopril  Cough   Oxycodone     Hallucinations    PAST MEDICAL HISTORY Past Medical History:  Diagnosis Date   Ankle fracture    left   Arthritis    Diabetes mellitus    Diabetic retinopathy ([place])    Family history of adverse reaction to anesthesia    " they had difficulty waking my dad up; he was about 48-16 years old."   Family history of MI (myocardial infarction)    GERD (gastroesophageal reflux disease)    Hypertension    Hypertensive retinopathy    Hypothyroidism    Painful orthopaedic hardware ([place])    PVC (premature ventricular contraction)    Smoker    former  " quit about 40 years ago"   Syncope    Wears glasses    Past Surgical History:  Procedure Laterality Date   CARDIAC CATHETERIZATION  2007   neg   CATARACT EXTRACTION     CATARACT EXTRACTION W/ INTRAOCULAR LENS  IMPLANT, BILATERAL     CESAREAN SECTION     EYE SURGERY     HARDWARE REMOVAL Left 03/19/2020   Procedure: HARDWARE REMOVAL LEFT ANKLE;  Surgeon: Shona Needles, MD;  Location: [facility];  Service: Orthopedics;  Laterality: Left;   ORIF ANKLE FRACTURE Left 07/19/2019   Procedure: OPEN REDUCTION INTERNAL FIXATION (ORIF) ANKLE FRACTURE;  Surgeon: Shona Needles, MD;  Location: [facility];  Service: Orthopedics;  Laterality: Left;   FAMILY HISTORY Family History  Problem Relation Age of Onset   Coronary artery disease Mother    Kidney disease Mother    Diabetes Mother    Heart disease Mother        heart attack   Hypertension Mother    Macular degeneration Father    Diabetes Sister    Diabetes Brother    Cancer Brother        lung cancer   Cancer Brother 43       lungs, brain, chest wall, liver cancer, stage 4   Diabetes Maternal Grandmother    Cancer Other 83       lung , brain    SOCIAL HISTORY Social History   Tobacco Use   Smoking status: Former    Types: Cigarettes    Quit date: 07/12/1981    Years since quitting: 39.9   Smokeless tobacco: Never  Vaping Use   Vaping Use: Never used   Substance Use Topics   Alcohol use: Not Currently    Alcohol/week: 0.0 standard drinks    Comment: very rare   Drug use: No       OPHTHALMIC EXAM:  Base Eye Exam     Visual Acuity (Snellen - Linear)       Right Left   Dist cc 20/25 -2 20/20   Dist ph cc  20/20     Correction: Glasses         Tonometry (Tonopen, 8:04 AM)       Right Left   Pressure 10 15         Pupils       Dark Light Shape React APD   Right 4 3 Round Brisk None   Left 4 3 Round Brisk None         Visual Fields (Counting fingers)       Left Right    Full Full         Extraocular Movement       Right Left    Full, Ortho Full, Ortho         Neuro/Psych     Oriented x3: Yes   Mood/Affect: Normal         Dilation     Both eyes: 1.0% Mydriacyl, 2.5% Phenylephrine @ 8:04 AM           Slit Lamp and Fundus Exam     Slit Lamp Exam       Right Left   Lids/Lashes Dermatochalasis - upper lid Dermatochalasis - upper lid   Conjunctiva/Sclera White and quiet temporal pinguecula   Cornea mild arcus, EBMD, trace Punctate epithelial erosions Mild arcus, EBMD, 1+ Punctate epithelial erosions   Anterior Chamber deep, clear, narrow temporal angle Deep and clear   Iris Round and reactive Round and dilated, No NVI   Lens PC IOL in good position PC IOL in good position   Anterior Vitreous Vitreous syneresis, Posterior vitreous detachment Vitreous syneresis, PVD, +residual RBC, white blood stained vitreous condensations settled inferiorly--improving, clearing centrally         Fundus Exam       Right Left   Disc Pink and Sharp Mild pallor, sharp rim, PPP   C/D Ratio 0.2 0.2   Macula Flat, Good foveal reflex, RPE mottling, No heme or edema Flat, blunted foveal reflex, mild RPE mottling, rare MA, No edema   Vessels mild attenuation, mild tortuousity mild attenuation, mild tortuousity   Periphery Attached, rare MA and DBH greatest inferiorly Inferior periphery obscured by blood  clots, otherwise attached, rare, scattered MA/DBH, no RT/RD            IMAGING AND PROCEDURES  Imaging and Procedures for 06/17/2021  OCT, Retina - OU - Both Eyes       Right Eye Quality was good. Central Foveal Thickness: 268. Progression has been stable. Findings include normal foveal contour, no IRF, no SRF (Trace ERM).   Left Eye Quality was good. Central Foveal Thickness: 277. Progression has improved. Findings include no IRF, no SRF, normal foveal contour (Mild interval improvement in vitreous opacities).   Notes *Images captured and stored on drive  Diagnosis / Impression:  OD: Trace macular ERM OS: Mild interval improvement in vitreous opacities  Clinical management:  See below  Abbreviations: NFP - Normal foveal profile. CME - cystoid macular edema. PED - pigment epithelial detachment. IRF - intraretinal fluid. SRF - subretinal fluid. EZ - ellipsoid zone. ERM - epiretinal membrane. ORA - outer retinal atrophy. ORT - outer retinal tubulation. SRHM - subretinal hyper-reflective material. IRHM - intraretinal hyper-reflective material             ASSESSMENT/PLAN:    ICD-10-CM   1. Vitreous hemorrhage of left eye (HCC)  H43.12     2. Retinal edema  H35.81 OCT, Retina - OU - Both Eyes    3. Moderate nonproliferative  diabetic retinopathy of both eyes without macular edema associated with type 2 diabetes mellitus ([place])  N86.7672     4. Essential hypertension  I10     5. Hypertensive retinopathy of both eyes  H35.033     6. Pseudophakia, both eyes  Z96.1      1,2. Vitreous Hemorrhage OS -- improving  - s/p IVA OS #1 (06.22.22), #2 (07.27.22), #3 (09.14.22)  - onset overnight and noted upon waking (06.22.22)  - unclear etiology -- hemorrhagic PVD v diabetic hemorrhage  - no RT/RD on 360 peripheral examination  - b-scan 6.29.22 without RT/RD or mass  - central VH fading and settling inferiorly  - BCVA stable at 20/20  - FA 8.24.22 shows interval improvement  in blockage/ VH compared to 06.22.22 FA; no NV OU - VH precautions reviewed -- minimize activities, keep head elevated, avoid ASA/NSAIDs/blood thinners as able - discussed possibility of residual / long-term floaters and possiblle preventative benefit of PRP laser after injection #3 OS -- to areas of peripheral nonperfusion - f/u 3-4 months -- DFE/OCT  3. Moderate nonproliferative diabetic retinopathy w/o DME, both eyes  - OS: with dense VH, improving  - FA 06.22.22 w/ scattered leaking MA -- no obvious NV, but OS with significant blockage from [facility] - Repeat FA 08.24.22 OS Interval improvement in blockage/ VH compared to 06.22.22 FA; no NV OU - OCT without diabetic macular edema, both eyes   - monitor  4,5. Hypertensive retinopathy OU - discussed importance of tight BP control - monitor  6. Pseudophakia OU  - s/p CE/IOL OU  - IOLs in good position, doing well  - monitor  Ophthalmic Meds Ordered this visit:  No orders of the defined types were placed in this encounter.    Return for f/u 3-4 months, VH OS, DFE, OCT.  There are no Patient Instructions on file for this visit.  This document serves as a record of services personally performed by Gardiner Sleeper, MD, PhD. It was created on their behalf by Orvan Falconer, an ophthalmic technician. The creation of this record is the provider's dictation and/or activities during the visit.    Electronically signed by: Orvan Falconer, OA, 06/17/21  9:16 AM   This document serves as a record of services personally performed by Gardiner Sleeper, MD, PhD. It was created on their behalf by San Jetty. Owens Shark, OA an ophthalmic technician. The creation of this record is the provider's dictation and/or activities during the visit.    Electronically signed by: San Jetty. Owens Shark, [place] 12.07.2022 9:16 AM   Gardiner Sleeper, M.D., Ph.D. Diseases & Surgery of the Retina and Vitreous [facility]  I have reviewed the above  documentation for accuracy and completeness, and I agree with the above. Gardiner Sleeper, M.D., Ph.D. 06/17/21 9:19 AM   Abbreviations: M myopia (nearsighted); A astigmatism; H hyperopia (farsighted); P presbyopia; Mrx spectacle prescription;  CTL contact lenses; OD right eye; OS left eye; OU both eyes  XT exotropia; ET esotropia; PEK punctate epithelial keratitis; PEE punctate epithelial erosions; DES dry eye syndrome; MGD meibomian gland dysfunction; ATs artificial tears; PFAT's preservative free artificial tears; [place] nuclear sclerotic cataract; PSC posterior subcapsular cataract; ERM epi-retinal membrane; PVD posterior vitreous detachment; RD retinal detachment; DM diabetes mellitus; DR diabetic retinopathy; NPDR non-proliferative diabetic retinopathy; PDR proliferative diabetic retinopathy; CSME clinically significant macular edema; DME diabetic macular edema; dbh dot blot hemorrhages; CWS cotton wool spot; POAG primary open angle glaucoma; C/D cup-to-disc ratio;  HVF humphrey visual field; GVF goldmann visual field; OCT optical coherence tomography; IOP intraocular pressure; BRVO Branch retinal vein occlusion; CRVO central retinal vein occlusion; CRAO central retinal artery occlusion; BRAO branch retinal artery occlusion; RT retinal tear; SB scleral buckle; PPV pars plana vitrectomy; VH Vitreous hemorrhage; PRP panretinal laser photocoagulation; IVK intravitreal kenalog; VMT vitreomacular traction; MH Macular hole;  NVD neovascularization of the disc; NVE neovascularization elsewhere; AREDS age related eye disease study; ARMD age related macular degeneration; POAG primary open angle glaucoma; EBMD epithelial/anterior basement membrane dystrophy; ACIOL anterior chamber intraocular lens; IOL intraocular lens; PCIOL posterior chamber intraocular lens; Phaco/IOL phacoemulsification with intraocular lens placement; Center photorefractive keratectomy; LASIK laser assisted in situ keratomileusis; HTN hypertension; DM  diabetes mellitus; COPD chronic obstructive pulmonary disease

## 2021-06-17 ENCOUNTER — Ambulatory Visit (INDEPENDENT_AMBULATORY_CARE_PROVIDER_SITE_OTHER): Payer: Medicare Other | Admitting: Ophthalmology

## 2021-06-17 ENCOUNTER — Other Ambulatory Visit: Payer: Self-pay

## 2021-06-17 ENCOUNTER — Encounter (INDEPENDENT_AMBULATORY_CARE_PROVIDER_SITE_OTHER): Payer: Self-pay | Admitting: Ophthalmology

## 2021-06-17 DIAGNOSIS — H4312 Vitreous hemorrhage, left eye: Secondary | ICD-10-CM

## 2021-06-17 DIAGNOSIS — H35033 Hypertensive retinopathy, bilateral: Secondary | ICD-10-CM

## 2021-06-17 DIAGNOSIS — I10 Essential (primary) hypertension: Secondary | ICD-10-CM | POA: Diagnosis not present

## 2021-06-17 DIAGNOSIS — Z961 Presence of intraocular lens: Secondary | ICD-10-CM

## 2021-06-17 DIAGNOSIS — H3581 Retinal edema: Secondary | ICD-10-CM

## 2021-06-17 DIAGNOSIS — E113393 Type 2 diabetes mellitus with moderate nonproliferative diabetic retinopathy without macular edema, bilateral: Secondary | ICD-10-CM | POA: Diagnosis not present

## 2021-06-24 DIAGNOSIS — E039 Hypothyroidism, unspecified: Secondary | ICD-10-CM | POA: Diagnosis not present

## 2021-06-24 DIAGNOSIS — I10 Essential (primary) hypertension: Secondary | ICD-10-CM | POA: Diagnosis not present

## 2021-06-24 DIAGNOSIS — H4312 Vitreous hemorrhage, left eye: Secondary | ICD-10-CM | POA: Diagnosis not present

## 2021-06-24 DIAGNOSIS — E042 Nontoxic multinodular goiter: Secondary | ICD-10-CM | POA: Diagnosis not present

## 2021-06-24 DIAGNOSIS — E10319 Type 1 diabetes mellitus with unspecified diabetic retinopathy without macular edema: Secondary | ICD-10-CM | POA: Diagnosis not present

## 2021-06-24 DIAGNOSIS — K219 Gastro-esophageal reflux disease without esophagitis: Secondary | ICD-10-CM | POA: Diagnosis not present

## 2021-06-24 DIAGNOSIS — H3581 Retinal edema: Secondary | ICD-10-CM | POA: Diagnosis not present

## 2021-06-24 DIAGNOSIS — E785 Hyperlipidemia, unspecified: Secondary | ICD-10-CM | POA: Diagnosis not present

## 2021-07-15 ENCOUNTER — Other Ambulatory Visit: Payer: Self-pay

## 2021-07-15 ENCOUNTER — Encounter (HOSPITAL_BASED_OUTPATIENT_CLINIC_OR_DEPARTMENT_OTHER): Payer: Self-pay | Admitting: Orthopedic Surgery

## 2021-07-15 NOTE — Progress Notes (Signed)
Spoke w/ via phone for pre-op interview---patient Lab needs dos----   Istat and EKG            Lab results------NA COVID test -----patient states asymptomatic no test needed Arrive at -------0800 07/20/21. NPO after MN NO Solid Food.  Clear liquids from MN until 0700. Med rec completed. Medications to take morning of surgery -----Synthroid. Hold Turmeric and Omega 3 until after procedure. Diabetic medication: keep insulin pump at basal rate. Bring CGM and pump supplies DOS. Move pump to R side prior to sx. Patient instructed no nail polish to be worn day of surgery Patient instructed to bring photo id and insurance card day of surgery Patient aware to have Driver (ride ) / caregiver    for 24 hours after surgery  Patient Special Instructions -----NA Pre-Op special Istructions -----NA Patient verbalized understanding of instructions that were given at this phone interview. Patient denies shortness of breath, chest pain, fever, cough at this phone interview.

## 2021-07-17 NOTE — H&P (Signed)
Preoperative History & Physical Exam  Surgeon: Matt Holmes, MD  Diagnosis: left small finger dupuytrens contracture  Planned Procedure: Procedure(s) (LRB): DUPUYTREN CONTRACTURE RELEASE, left small finger (Left)  History of Present Illness:   Patient is a 71 y.o. female with symptoms consistent with  left small finger dupuytrens contracture who presents for surgical intervention. The risks, benefits and alternatives of surgical intervention were discussed and informed consent was obtained prior to surgery.  Past Medical History:  Past Medical History:  Diagnosis Date   Ankle fracture    left   Arthritis    Diabetes mellitus    Diabetic retinopathy ([place])    Family history of adverse reaction to anesthesia    " they had difficulty waking my dad up; he was about 24-59 years old;" neice had anaphylactic reaction after anesthesia   Family history of MI (myocardial infarction)    GERD (gastroesophageal reflux disease)    Hypertension    Hypertensive retinopathy    Hypothyroidism    Painful orthopaedic hardware ([place])    PVC (premature ventricular contraction)    PVC's (premature ventricular contractions)    Smoker    former  " quit about 40 years ago"   Syncope    Vitreous hemorrhage of left eye ([place])    Wears glasses     Past Surgical History:  Past Surgical History:  Procedure Laterality Date   CARDIAC CATHETERIZATION  2007   neg   CATARACT EXTRACTION     CATARACT EXTRACTION W/ INTRAOCULAR LENS  IMPLANT, BILATERAL     CESAREAN SECTION     EYE SURGERY     HARDWARE REMOVAL Left 03/19/2020   Procedure: HARDWARE REMOVAL LEFT ANKLE;  Surgeon: Shona Needles, MD;  Location: [facility];  Service: Orthopedics;  Laterality: Left;   ORIF ANKLE FRACTURE Left 07/19/2019   Procedure: OPEN REDUCTION INTERNAL FIXATION (ORIF) ANKLE FRACTURE;  Surgeon: Shona Needles, MD;  Location: [facility];  Service: Orthopedics;  Laterality: Left;    Medications:  Prior to Admission medications    Medication Sig Start Date End Date Taking? Authorizing Provider  hydrochlorothiazide (MICROZIDE) 12.5 MG capsule Take 12.5 mg by mouth daily. 06/02/19  Yes [provider]  insulin aspart (NOVOLOG) 100 UNIT/ML injection Inject 75-100 Units into the skin daily. Via insulin pump as directed   Yes [provider]  irbesartan (AVAPRO) 150 MG tablet Take 150 mg by mouth daily.   Yes [provider]  levothyroxine (SYNTHROID, LEVOTHROID) 75 MCG tablet Take 1 tablet (75 mcg total) by mouth daily. Patient taking differently: Take 75 mcg by mouth See admin instructions. Take 75 mg on Mon, Tue, Thurs, Fri, and Sat. Skip Sun and Wed dose 03/29/16  Yes Dickie La, MD  Magnesium 200 MG CHEW Chew 300 mg by mouth daily.   Yes [provider]  Omega-3 Fatty Acids (FISH OIL ADULT GUMMIES PO) Take 1 capsule by mouth daily.    Yes [provider]  Turmeric 500 MG CAPS Take 500 mg by mouth daily.   Yes [provider]  Vitamin D, Ergocalciferol, (DRISDOL) 1.25 MG (50000 UT) CAPS capsule Take 50,000 Units by mouth every Tuesday.  03/14/19  Yes [provider]  acetaminophen (TYLENOL) 500 MG tablet Take 1,000 mg by mouth every 8 (eight) hours as needed for moderate pain.    [provider]  ALPRAZolam Duanne Moron) 0.5 MG tablet take one by mouth prn anxiety 10/09/19   [provider]  calcium carbonate (TUMS -  DOSED IN MG ELEMENTAL CALCIUM) 500 MG chewable tablet 1 tablet    [provider]  Cephalexin 500 MG tablet Take 1 tablet po 2x a day x 1 week-Rx replaces Rx of Doxycycline previously sent into Pharmacy yesterday 05/21/21   [provider]  cycloSPORINE (RESTASIS) 0.05 % ophthalmic emulsion Place 1 drop into both eyes 2 (two) times daily. Patient not taking: Reported on 06/17/2021    [provider]  doxycycline (VIBRA-TABS) 100 MG tablet Take 1 tablet po 2x a day x 1 week 05/20/21   [provider]   esomeprazole (NEXIUM) 20 MG capsule 1 capsule    [provider]  glucose blood test strip 1 each by Other route 6 (six) times daily. Use as instructed     [provider]  ibuprofen (ADVIL) 200 MG tablet Take 200-800 mg by mouth every 6 (six) hours as needed for moderate pain.    [provider]  methocarbamol (ROBAXIN) 500 MG tablet Take 500 mg by mouth every 8 (eight) hours as needed. 03/17/21   [provider]  minocycline (MINOCIN) 100 MG capsule Take 100 mg by mouth daily. 07/07/20   [provider]  propranolol (INDERAL) 20 MG tablet TAKE 1 TABLET (20 MG TOTAL) BY MOUTH 3 (THREE) TIMES DAILY AS NEEDED. Patient taking differently: Take 10-20 mg by mouth 3 (three) times daily as needed (palpitations). 09/10/19   Minna Merritts, MD  XIIDRA 5 % SOLN Apply 1 drop to eye 2 (two) times daily. Patient not taking: Reported on 06/17/2021 05/15/21   [provider]    Allergies:  Doxycycline, Lisinopril, Minocycline, Oxycodone, and Prednisone  Review of Systems: Negative except per HPI.  Physical Exam: Alert and oriented, NAD Head and neck: no masses, normal alignment CV: pulse intact Pulm: no increased work of breathing, respirations even and unlabored Abdomen: non-distended Extremities: extremities warm and well perfused  LABS: No results found for this or any previous visit (from the past 2160 hour(s)).   Complete History and Physical exam available in the office notes  Orene Desanctis

## 2021-07-17 NOTE — Discharge Instructions (Addendum)
°  Orthopaedic Hand Surgery Discharge Instructions  WEIGHT BEARING STATUS: Non weight bearing on operative extremity  DRESSING CARE: Please keep your dressing/splint/cast clean and dry until your follow-up appointment. You may shower by placing a waterproof covering over your dressing/splint/cast. Contact your surgeon if your splint/cast gets wet. It will need to be changed to prevent skin breakdown.  PAIN CONTROL: First line medications for post operative pain control are Tylenol (acetaminophen) and Motrin (ibuprofen) if you are able to take these medications. If you have been prescribed a medication these can be taken as breakthrough pain medications. Please note that some narcotic pain medication has acetaminophen added and you should never consume more than 4,000mg  of acetaminophen in 24-hour period. Please note that if you are given Toradol (ketorolac) you should not take similar medications such as ibuprofen or naproxen.  DISCHARGE MEDICATIONS: If you have been prescribed medication it was sent electronically to your pharmacy. No changes have been made to your home medications.  ICE/ELEVATION: Ice and elevate your injured extremity as needed. Avoid direct contact of ice with skin.   BANDAGE FEELS TOO TIGHT: If your bandage feels too tight, first make sure you are elevating your fingers as much as possible. The outer layer of the bandage can be unwrapped and reapplied more loosely. If no improvement, you may carefully cut the inner layer longitudinally until the pressure has resolved and then rewrap the outer layer. If you are not comfortable with these instructions, please call the office and the bandage can be changed for you.   FOLLOW UP: You will be called after surgery with an appointment date and time, however if you have not received a phone call within 3 days, please call during regular office hours at [phone] to schedule a post operative appointment.  Please Seek Medical Attention  if: Call MD for: pain or pressure in chest, jaw, arm, back, neck  Call MD for: temperature greater than 101 F for more than 24 hrs Call MD for: difficulty breathing Call MD for: incision redness, bleeding, drainage  Call MD for: palpitations or feeling that the heart is racing  Call MD for: increased swelling in arm, leg, ankle, or abdomen  Call MD for: lightheadedness, dizziness, fainting Call 911 or go to ER for any medical emergency if you are not able to get in touch with your doctor   J. Sable Feil, MD Orthopaedic Hand Surgeon [facility] Office number: [phone] [address], [address] [place], [place] [address]  Post Anesthesia Home Care Instructions  Activity: Get plenty of rest for the remainder of the day. A responsible adult should stay with you for 24 hours following the procedure.  For the next 24 hours, DO NOT: -Drive a car -Paediatric nurse -Drink alcoholic beverages -Take any medication unless instructed by your physician -Make any legal decisions or sign important papers.  Meals: Start with liquid foods such as gelatin or soup. Progress to regular foods as tolerated. Avoid greasy, spicy, heavy foods. If nausea and/or vomiting occur, drink only clear liquids until the nausea and/or vomiting subsides. Call your physician if vomiting continues.  Special Instructions/Symptoms: Your throat may feel dry or sore from the anesthesia or the breathing tube placed in your throat during surgery. If this causes discomfort, gargle with warm salt water. The discomfort should disappear within 24 hours.

## 2021-07-19 NOTE — Anesthesia Preprocedure Evaluation (Addendum)
Anesthesia Evaluation  Patient identified by MRN, date of birth, ID band Patient awake    Reviewed: Allergy & Precautions, NPO status , Patient's Chart, lab work & pertinent test results  Airway Mallampati: II  TM Distance: >3 FB Neck ROM: Full    Dental  (+) Dental Advisory Given, Caps, Teeth Intact   Pulmonary neg pulmonary ROS, former smoker,    Pulmonary exam normal breath sounds clear to auscultation       Cardiovascular hypertension, Pt. on medications and Pt. on home beta blockers + angina Normal cardiovascular exam Rhythm:Regular Rate:Normal  Nuclear stress test 11/07/17: low risk study, EF 55-65%, small area of anteroapical ischemia vs. Breast attenuation, normal regional and global systolic function   Neuro/Psych negative psych ROS   GI/Hepatic Neg liver ROS, GERD  Medicated,  Endo/Other  diabetes, Type 1, Insulin DependentHypothyroidism   Renal/GU negative Renal ROS     Musculoskeletal  (+) Arthritis ,   Abdominal   Peds  Hematology   Anesthesia Other Findings   Reproductive/Obstetrics                            Anesthesia Physical  Anesthesia Plan  ASA: 3  Anesthesia Plan: MAC   Post-op Pain Management: Minimal or no pain anticipated and Tylenol PO (pre-op)   Induction: Intravenous  PONV Risk Score and Plan: 3 and Treatment may vary due to age or medical condition, Ondansetron, Midazolam and Propofol infusion  Airway Management Planned: Natural Airway  Additional Equipment: None  Intra-op Plan:   Post-operative Plan:   Informed Consent: I have reviewed the patients History and Physical, chart, labs and discussed the procedure including the risks, benefits and alternatives for the proposed anesthesia with the patient or authorized representative who has indicated his/her understanding and acceptance.     Dental advisory given  Plan Discussed with:  CRNA  Anesthesia Plan Comments: (Field block by surgeon)      Anesthesia Quick Evaluation

## 2021-07-20 ENCOUNTER — Encounter (HOSPITAL_BASED_OUTPATIENT_CLINIC_OR_DEPARTMENT_OTHER)
Admission: RE | Disposition: A | Discharge status: Home / Self Care | Payer: Self-pay | Source: Ambulatory Visit | Attending: Orthopedic Surgery

## 2021-07-20 ENCOUNTER — Ambulatory Visit (HOSPITAL_BASED_OUTPATIENT_CLINIC_OR_DEPARTMENT_OTHER): Payer: Medicare Other | Admitting: Anesthesiology

## 2021-07-20 ENCOUNTER — Ambulatory Visit (HOSPITAL_BASED_OUTPATIENT_CLINIC_OR_DEPARTMENT_OTHER)
Admission: RE | Admit: 2021-07-20 | Discharge: 2021-07-20 | Disposition: A | Discharge status: Home / Self Care | Payer: Medicare Other | Source: Ambulatory Visit | Attending: Orthopedic Surgery | Admitting: Orthopedic Surgery

## 2021-07-20 ENCOUNTER — Encounter (HOSPITAL_BASED_OUTPATIENT_CLINIC_OR_DEPARTMENT_OTHER): Payer: Self-pay | Admitting: Orthopedic Surgery

## 2021-07-20 ENCOUNTER — Other Ambulatory Visit: Payer: Self-pay

## 2021-07-20 DIAGNOSIS — I10 Essential (primary) hypertension: Secondary | ICD-10-CM | POA: Diagnosis not present

## 2021-07-20 DIAGNOSIS — E10319 Type 1 diabetes mellitus with unspecified diabetic retinopathy without macular edema: Secondary | ICD-10-CM | POA: Insufficient documentation

## 2021-07-20 DIAGNOSIS — Z794 Long term (current) use of insulin: Secondary | ICD-10-CM | POA: Insufficient documentation

## 2021-07-20 DIAGNOSIS — K219 Gastro-esophageal reflux disease without esophagitis: Secondary | ICD-10-CM | POA: Insufficient documentation

## 2021-07-20 DIAGNOSIS — E109 Type 1 diabetes mellitus without complications: Secondary | ICD-10-CM | POA: Diagnosis not present

## 2021-07-20 DIAGNOSIS — Z79899 Other long term (current) drug therapy: Secondary | ICD-10-CM | POA: Insufficient documentation

## 2021-07-20 DIAGNOSIS — M720 Palmar fascial fibromatosis [Dupuytren]: Secondary | ICD-10-CM | POA: Insufficient documentation

## 2021-07-20 DIAGNOSIS — Z87891 Personal history of nicotine dependence: Secondary | ICD-10-CM | POA: Diagnosis not present

## 2021-07-20 DIAGNOSIS — E039 Hypothyroidism, unspecified: Secondary | ICD-10-CM | POA: Diagnosis not present

## 2021-07-20 HISTORY — PX: DUPUYTREN CONTRACTURE RELEASE: SHX1478

## 2021-07-20 HISTORY — DX: Ventricular premature depolarization: I49.3

## 2021-07-20 HISTORY — DX: Vitreous hemorrhage, left eye: H43.12

## 2021-07-20 LAB — POCT I-STAT, CHEM 8
BUN: 22 mg/dL (ref 8–23)
Calcium, Ion: 1.19 mmol/L (ref 1.15–1.40)
Chloride: 102 mmol/L (ref 98–111)
Creatinine, Ser: 0.6 mg/dL (ref 0.44–1.00)
Glucose, Bld: 225 mg/dL — ABNORMAL HIGH (ref 70–99)
HCT: 51 % — ABNORMAL HIGH (ref 36.0–46.0)
Hemoglobin: 17.3 g/dL — ABNORMAL HIGH (ref 12.0–15.0)
Potassium: 3.9 mmol/L (ref 3.5–5.1)
Sodium: 141 mmol/L (ref 135–145)
TCO2: 27 mmol/L (ref 22–32)

## 2021-07-20 LAB — GLUCOSE, CAPILLARY: Glucose-Capillary: 184 mg/dL — ABNORMAL HIGH (ref 70–99)

## 2021-07-20 SURGERY — RELEASE, DUPUYTREN CONTRACTURE
Anesthesia: Monitor Anesthesia Care | Site: Hand | Laterality: Left

## 2021-07-20 MED ORDER — BUPIVACAINE HCL 0.5 % IJ SOLN
INTRAMUSCULAR | Status: DC | PRN
  Administered 2021-07-20: 10 mL

## 2021-07-20 MED ORDER — ONDANSETRON HCL 4 MG/2ML IJ SOLN
INTRAMUSCULAR | Status: DC | PRN
  Administered 2021-07-20: 4 mg via INTRAVENOUS

## 2021-07-20 MED ORDER — PROPOFOL 500 MG/50ML IV EMUL
INTRAVENOUS | Status: AC
  Filled 2021-07-20: qty 50

## 2021-07-20 MED ORDER — PHENYLEPHRINE 40 MCG/ML (10ML) SYRINGE FOR IV PUSH (FOR BLOOD PRESSURE SUPPORT)
PREFILLED_SYRINGE | INTRAVENOUS | Status: AC
  Filled 2021-07-20: qty 10

## 2021-07-20 MED ORDER — CEFAZOLIN SODIUM-DEXTROSE 2-4 GM/100ML-% IV SOLN
2.0000 g | INTRAVENOUS | Status: AC
  Administered 2021-07-20: 2 g via INTRAVENOUS

## 2021-07-20 MED ORDER — PROPOFOL 10 MG/ML IV BOLUS
INTRAVENOUS | Status: AC
  Filled 2021-07-20: qty 20

## 2021-07-20 MED ORDER — FENTANYL CITRATE (PF) 100 MCG/2ML IJ SOLN
INTRAMUSCULAR | Status: DC | PRN
  Administered 2021-07-20 (×2): 25 ug via INTRAVENOUS

## 2021-07-20 MED ORDER — LACTATED RINGERS IV SOLN
INTRAVENOUS | Status: DC
  Administered 2021-07-20: 1000 mL via INTRAVENOUS

## 2021-07-20 MED ORDER — HYDROCODONE-ACETAMINOPHEN 5-325 MG PO TABS: 1.0000 | ORAL_TABLET | Freq: Four times a day (QID) | ORAL | 0 refills | Status: AC | PRN

## 2021-07-20 MED ORDER — 0.9 % SODIUM CHLORIDE (POUR BTL) OPTIME
TOPICAL | Status: DC | PRN
  Administered 2021-07-20: 500 mL

## 2021-07-20 MED ORDER — FENTANYL CITRATE (PF) 100 MCG/2ML IJ SOLN
INTRAMUSCULAR | Status: AC
  Filled 2021-07-20: qty 2

## 2021-07-20 MED ORDER — MIDAZOLAM HCL 2 MG/2ML IJ SOLN
INTRAMUSCULAR | Status: AC
  Filled 2021-07-20: qty 2

## 2021-07-20 MED ORDER — LIDOCAINE HCL (PF) 1 % IJ SOLN
INTRAMUSCULAR | Status: DC | PRN
  Administered 2021-07-20: 10 mL

## 2021-07-20 MED ORDER — BACITRACIN ZINC 500 UNIT/GM EX OINT
TOPICAL_OINTMENT | CUTANEOUS | Status: DC | PRN
  Administered 2021-07-20: 1 via TOPICAL

## 2021-07-20 MED ORDER — PHENYLEPHRINE HCL (PRESSORS) 10 MG/ML IV SOLN
INTRAVENOUS | Status: DC | PRN
Start: 2021-07-20 — End: 2021-07-20
  Administered 2021-07-20: 80 ug via INTRAVENOUS
  Administered 2021-07-20: 40 ug via INTRAVENOUS

## 2021-07-20 MED ORDER — MIDAZOLAM HCL 5 MG/5ML IJ SOLN
INTRAMUSCULAR | Status: DC | PRN
  Administered 2021-07-20 (×2): 1 mg via INTRAVENOUS

## 2021-07-20 MED ORDER — CEFAZOLIN SODIUM-DEXTROSE 2-4 GM/100ML-% IV SOLN
INTRAVENOUS | Status: AC
  Filled 2021-07-20: qty 100

## 2021-07-20 MED ORDER — ONDANSETRON HCL 4 MG/2ML IJ SOLN
INTRAMUSCULAR | Status: AC
  Filled 2021-07-20: qty 2

## 2021-07-20 MED ORDER — PROPOFOL 500 MG/50ML IV EMUL
INTRAVENOUS | Status: DC | PRN
  Administered 2021-07-20: 75 ug/kg/min via INTRAVENOUS

## 2021-07-20 SURGICAL SUPPLY — 35 items
BLADE SURG 15 STRL LF DISP TIS (BLADE) ×1 IMPLANT
BLADE SURG 15 STRL SS (BLADE) ×2
BNDG CMPR 9X4 STRL LF SNTH (GAUZE/BANDAGES/DRESSINGS) ×1
BNDG ELASTIC 4X5.8 VLCR STR LF (GAUZE/BANDAGES/DRESSINGS) ×2 IMPLANT
BNDG ESMARK 4X9 LF (GAUZE/BANDAGES/DRESSINGS) ×2 IMPLANT
BNDG GAUZE ELAST 4 BULKY (GAUZE/BANDAGES/DRESSINGS) ×1 IMPLANT
COVER BACK TABLE 60X90IN (DRAPES) ×2 IMPLANT
CUFF TOURN SGL QUICK 18 (TOURNIQUET CUFF) ×1 IMPLANT
CUFF TOURN SGL QUICK 18X4 (TOURNIQUET CUFF) IMPLANT
CUFF TOURN SGL QUICK 24 (TOURNIQUET CUFF)
CUFF TRNQT CYL 24X4X16.5-23 (TOURNIQUET CUFF) IMPLANT
DRAPE EXTREMITY T 121X128X90 (DISPOSABLE) ×2 IMPLANT
DRAPE SHEET LG 3/4 BI-LAMINATE (DRAPES) ×2 IMPLANT
DRAPE SURG 17X23 STRL (DRAPES) ×2 IMPLANT
DRSG EMULSION OIL 3X3 NADH (GAUZE/BANDAGES/DRESSINGS) ×1 IMPLANT
DRSG KUZMA FLUFF (GAUZE/BANDAGES/DRESSINGS) IMPLANT
GAUZE 4X4 16PLY ~~LOC~~+RFID DBL (SPONGE) ×2 IMPLANT
GAUZE SPONGE 4X4 12PLY STRL (GAUZE/BANDAGES/DRESSINGS) ×2 IMPLANT
GAUZE XEROFORM 1X8 LF (GAUZE/BANDAGES/DRESSINGS) ×1 IMPLANT
GOWN STRL REUS W/ TWL LRG LVL3 (GOWN DISPOSABLE) ×1 IMPLANT
GOWN STRL REUS W/TWL LRG LVL3 (GOWN DISPOSABLE) ×2
HIBICLENS CHG 4% 4OZ BTL (MISCELLANEOUS) ×2 IMPLANT
NEEDLE HYPO 22GX1.5 SAFETY (NEEDLE) ×5 IMPLANT
NS IRRIG 1000ML POUR BTL (IV SOLUTION) ×1 IMPLANT
NS IRRIG 500ML POUR BTL (IV SOLUTION) ×1 IMPLANT
PACK BASIN DAY SURGERY FS (CUSTOM PROCEDURE TRAY) ×2 IMPLANT
PAD CAST 4YDX4 CTTN HI CHSV (CAST SUPPLIES) ×1 IMPLANT
PADDING CAST COTTON 4X4 STRL (CAST SUPPLIES) ×2
SUT CHROMIC 4 0 PS 2 18 (SUTURE) ×1 IMPLANT
SUT ETHILON 4 0 PS 2 18 (SUTURE) ×1 IMPLANT
SYR 10ML LL (SYRINGE) ×3 IMPLANT
SYR BULB EAR ULCER 3OZ GRN STR (SYRINGE) ×2 IMPLANT
TOWEL OR 17X26 10 PK STRL BLUE (TOWEL DISPOSABLE) ×2 IMPLANT
TRAY DSU PREP LF (CUSTOM PROCEDURE TRAY) ×2 IMPLANT
UNDERPAD 30X36 HEAVY ABSORB (UNDERPADS AND DIAPERS) ×2 IMPLANT

## 2021-07-20 NOTE — Op Note (Signed)
OPERATIVE NOTE  DATE OF PROCEDURE: 07/20/2021  SURGEONS:  Primary: Orene Desanctis, MD  PREOPERATIVE DIAGNOSIS: left small finger dupuytrens contracture  POSTOPERATIVE DIAGNOSIS: Same  NAME OF PROCEDURE:   Left palm and small finger dupuytrens partial palmar fasciectomy  ANESTHESIA: Monitor Anesthesia Care + Local  SKIN PREPARATION: Hibiclens  ESTIMATED BLOOD LOSS: Minimal  IMPLANTS: none  INDICATIONS:  Jamie Mays is a 71 y.o. female who has the above preoperative diagnosis. The patient has decided to proceed with surgical intervention.  Risks, benefits and alternatives of operative management were discussed including, but not limited to, risks of anesthesia complications, infection, pain, persistent symptoms, stiffness, need for future surgery.  The patient understands, agrees and elects to proceed with surgery.    DESCRIPTION OF PROCEDURE: The patient was met in the pre-operative area and their identity was verified.  The operative location and laterality was also verified and marked.  The patient was brought to the OR and was placed supine on the table.  After repeat patient identification with the operative team anesthesia was provided and the patient was prepped and draped in the usual sterile fashion.  A final timeout was performed verifying the correction patient, procedure, location and laterality.  The left upper extremity was elevated and exsanguinated with an Esmarch and tourniquet inflated to 250 mmHg.  Local anesthesia was provided after adequate anesthesia a Bruner incision was made over the palm of the left small finger.  Skin and subcutaneous tissues were divided.  The pretendinous cord was identified and the skin and subcutaneous tissue were sharply elevated off the cord.  The neurovascular structures were protected both radially and ulnarly and the diseased palmar fascia Dupuytren's cord was excised.  The finger was brought to full extension.  Of note the patient did have a  severe PIP contracture which was released with this procedure however had an incompetent extensor mechanism most likely and I did discuss with her preoperatively it be difficult to maintain full extension.  No further dissection was made to the PIP joint.  The wound was thoroughly irrigated and the skin flaps were closed with 4-0 chromic suture.  The tourniquet was deflated after a soft dressing was applied and the finger was pink and warm and well-perfused at the end the procedure.  All counts were correct x2.  The patient tolerated the procedure well.  The patient was awoken from anesthesia and brought to PACU for recovery in stable condition.   Matt Holmes, MD

## 2021-07-20 NOTE — Anesthesia Procedure Notes (Signed)
Procedure Name: MAC Date/Time: 07/20/2021 9:30 AM Performed by: Justice Rocher, CRNA Pre-anesthesia Checklist: Timeout performed, Patient being monitored, Suction available, Emergency Drugs available and Patient identified Patient Re-evaluated:Patient Re-evaluated prior to induction Oxygen Delivery Method: Simple face mask Preoxygenation: Pre-oxygenation with 100% oxygen Induction Type: IV induction Placement Confirmation: breath sounds checked- equal and bilateral, CO2 detector and positive ETCO2

## 2021-07-20 NOTE — Transfer of Care (Signed)
Immediate Anesthesia Transfer of Care Note  Patient: Jamie Mays  Procedure(s) Performed: Procedure(s) (LRB): DUPUYTREN CONTRACTURE RELEASE, left small finger (Left)  Patient Location: PACU  Anesthesia Type: MAC  Level of Consciousness: awake, sedated, patient cooperative and responds to stimulation  Airway & Oxygen Therapy: Patient Spontanous Breathing and Patient connected to face mask on RA   Post-op Assessment: Report given to PACU RN, Post -op Vital signs reviewed and stable and Patient moving all extremities  Post vital signs: Reviewed and stable  Complications: No apparent anesthesia complications

## 2021-07-20 NOTE — Interval H&P Note (Signed)
History and Physical Interval Note:  07/20/2021 9:13 AM  Jamie Mays  has presented today for surgery, with the diagnosis of left small finger dupuytrens contracture.  The various methods of treatment have been discussed with the patient and family. After consideration of risks, benefits and other options for treatment, the patient has consented to  Procedure(s): DUPUYTREN CONTRACTURE RELEASE, left small finger (Left) as a surgical intervention.  The patient's history has been reviewed, patient examined, no change in status, stable for surgery.  I have reviewed the patient's chart and labs.  Questions were answered to the patient's satisfaction.     Orene Desanctis

## 2021-07-20 NOTE — Anesthesia Postprocedure Evaluation (Signed)
Anesthesia Post Note  Patient: Jamie Mays  Procedure(s) Performed: DUPUYTREN CONTRACTURE RELEASE, left small finger (Left: Hand)     Patient location during evaluation: PACU Anesthesia Type: MAC Level of consciousness: awake and alert Pain management: pain level controlled Vital Signs Assessment: post-procedure vital signs reviewed and stable Respiratory status: spontaneous breathing Cardiovascular status: stable Anesthetic complications: no   No notable events documented.  Last Vitals:  Vitals:   07/20/21 1039 07/20/21 1107  BP:  (!) 114/57  Pulse: (!) 56 (!) 57  Resp: 12 18  Temp:    SpO2: 97% 98%    Last Pain:  Vitals:   07/20/21 1107  TempSrc:   PainSc: 0-No pain                 Nolon Nations

## 2021-07-21 ENCOUNTER — Encounter (HOSPITAL_BASED_OUTPATIENT_CLINIC_OR_DEPARTMENT_OTHER): Payer: Self-pay | Admitting: Orthopedic Surgery

## 2021-07-23 DIAGNOSIS — M25642 Stiffness of left hand, not elsewhere classified: Secondary | ICD-10-CM | POA: Diagnosis not present

## 2021-07-30 DIAGNOSIS — M25642 Stiffness of left hand, not elsewhere classified: Secondary | ICD-10-CM | POA: Diagnosis not present

## 2021-08-04 DIAGNOSIS — M25642 Stiffness of left hand, not elsewhere classified: Secondary | ICD-10-CM | POA: Diagnosis not present

## 2021-09-23 NOTE — Progress Notes (Signed)
?[facility] Note ? ?09/30/2021 ? ?  ? ?CHIEF COMPLAINT ?Patient presents for Retina Follow Up ? ? ? ?HISTORY OF PRESENT ILLNESS: ?Jamie Mays is a 71 y.o. female who presents to the clinic today for:  ? ?HPI   ? ? Retina Follow Up   ?Patient presents with  Other.  In left eye.  Severity is moderate.  Duration of 3 months.  Since onset it is stable.  I, the attending physician,  performed the HPI with the patient and updated documentation appropriately. ? ?  ?  ? ? Comments   ?Pt here for 3 mo ret f/u VH OS. Pt states VA is doing ok, some foggy blurriness recently but she attributes that to allergies. Having some dry eyes issues, going to go back to [facility]. Has new floater in OD, several months. Still seeing floaters OS. Pt wanting to know if laser is still an option OS.  ? ?  ?  ?Last edited by Bernarda Caffey, MD on 10/05/2021 12:47 AM.  ?  ?Pt states she is still noticing floaters, but feels like they have not been as bad this week, her eyes have been dry recently ? ?Referring physician: ?Monna Fam, MD ?[place],  [place] [id number] ? ?HISTORICAL INFORMATION:  ?Selected notes from the [facility] ?Referred by Dr. Herbert Deaner for acute VH OS ?LEE:  ?Ocular Hx- ?PMH- ?  ? ?CURRENT MEDICATIONS: ?Current Outpatient Medications (Ophthalmic Drugs)  ?Medication Sig  ? XIIDRA 5 % SOLN Apply 1 drop to eye 2 (two) times daily. (Patient not taking: Reported on 06/17/2021)  ? ?No current facility-administered medications for this visit. (Ophthalmic Drugs)  ? ?Current Outpatient Medications (Other)  ?Medication Sig  ? ALPRAZolam (XANAX) 0.5 MG tablet take one by mouth prn anxiety  ? calcium carbonate (TUMS - DOSED IN MG ELEMENTAL CALCIUM) 500 MG chewable tablet 1 tablet  ? esomeprazole (NEXIUM) 20 MG capsule 1 capsule  ? glucose blood test strip 1 each by Other route 6 (six) times daily. Use as instructed   ? hydrochlorothiazide (MICROZIDE) 12.5 MG capsule Take 12.5 mg  by mouth daily.  ? insulin aspart (NOVOLOG) 100 UNIT/ML injection Inject 75-100 Units into the skin daily. Via insulin pump as directed  ? irbesartan (AVAPRO) 150 MG tablet Take 150 mg by mouth daily.  ? levothyroxine (SYNTHROID, LEVOTHROID) 75 MCG tablet Take 1 tablet (75 mcg total) by mouth daily. (Patient taking differently: Take 75 mcg by mouth See admin instructions. Take 75 mg on Mon, Tue, Thurs, Fri, and Sat. Skip Sun and Wed dose)  ? Magnesium 200 MG CHEW Chew 300 mg by mouth daily.  ? Omega-3 Fatty Acids (FISH OIL ADULT GUMMIES PO) Take 1 capsule by mouth daily.   ? propranolol (INDERAL) 20 MG tablet TAKE 1 TABLET (20 MG TOTAL) BY MOUTH 3 (THREE) TIMES DAILY AS NEEDED. (Patient taking differently: Take 10-20 mg by mouth 3 (three) times daily as needed (palpitations).)  ? Turmeric 500 MG CAPS Take 500 mg by mouth daily.  ? Vitamin D, Ergocalciferol, (DRISDOL) 1.25 MG (50000 UT) CAPS capsule Take 50,000 Units by mouth every Tuesday.   ? ?No current facility-administered medications for this visit. (Other)  ? ?REVIEW OF SYSTEMS: ?ROS   ?Positive for: Musculoskeletal, Endocrine, Eyes ?Negative for: Constitutional, Gastrointestinal, Neurological, Skin, Genitourinary, HENT, Cardiovascular, Respiratory, Psychiatric, Allergic/Imm, Heme/Lymph ?Last edited by Kingsley Spittle, COT on 09/30/2021  7:55 AM.  ?  ? ?ALLERGIES ?Allergies  ?  Allergen Reactions  ? Doxycycline Other (See Comments)  ?  Joint pain  ? Lisinopril Cough  ? Minocycline Other (See Comments)  ?  Dizziness ?  ? Oxycodone   ?  Hallucinations   ? Prednisone Other (See Comments)  ?  "Nausea and weakness"  ? ?PAST MEDICAL HISTORY ?Past Medical History:  ?Diagnosis Date  ? Ankle fracture   ? left  ? Arthritis   ? Diabetes mellitus   ? Diabetic retinopathy ([place])   ? Family history of adverse reaction to anesthesia   ? " they had difficulty waking my dad up; he was about 46-26 years old;" neice had anaphylactic reaction after anesthesia  ? Family history of  MI (myocardial infarction)   ? GERD (gastroesophageal reflux disease)   ? Hypertension   ? Hypertensive retinopathy   ? Hypothyroidism   ? Painful orthopaedic hardware [facility])   ? PVC (premature ventricular contraction)   ? PVC's (premature ventricular contractions)   ? Smoker   ? former  " quit about 40 years ago"  ? Syncope   ? Vitreous hemorrhage of left eye ([place])   ? Wears glasses   ? ?Past Surgical History:  ?Procedure Laterality Date  ? CARDIAC CATHETERIZATION  2007  ? neg  ? CATARACT EXTRACTION    ? CATARACT EXTRACTION W/ INTRAOCULAR LENS  IMPLANT, BILATERAL    ? CESAREAN SECTION    ? DUPUYTREN CONTRACTURE RELEASE Left 07/20/2021  ? Procedure: DUPUYTREN CONTRACTURE RELEASE, left small finger;  Surgeon: Orene Desanctis, MD;  Location: [facility];  Service: Orthopedics;  Laterality: Left;  ? EYE SURGERY    ? HARDWARE REMOVAL Left 03/19/2020  ? Procedure: HARDWARE REMOVAL LEFT ANKLE;  Surgeon: Shona Needles, MD;  Location: [facility];  Service: Orthopedics;  Laterality: Left;  ? ORIF ANKLE FRACTURE Left 07/19/2019  ? Procedure: OPEN REDUCTION INTERNAL FIXATION (ORIF) ANKLE FRACTURE;  Surgeon: Shona Needles, MD;  Location: [facility];  Service: Orthopedics;  Laterality: Left;  ? ?FAMILY HISTORY ?Family History  ?Problem Relation Age of Onset  ? Coronary artery disease Mother   ? Kidney disease Mother   ? Diabetes Mother   ? Heart disease Mother   ?     heart attack  ? Hypertension Mother   ? Macular degeneration Father   ? Diabetes Sister   ? Diabetes Brother   ? Cancer Brother   ?     lung cancer  ? Cancer Brother 19  ?     lungs, brain, chest wall, liver cancer, stage 4  ? Diabetes Maternal Grandmother   ? Cancer Other 70  ?     lung , brain  ? ? ?SOCIAL HISTORY ?Social History  ? ?Tobacco Use  ? Smoking status: Former  ?  Types: Cigarettes  ?  Quit date: 07/12/1981  ?  Years since quitting: 40.2  ? Smokeless tobacco: Never  ?Vaping Use  ? Vaping Use: Never used  ?Substance Use Topics  ? Alcohol use: Not  Currently  ?  Alcohol/week: 0.0 standard drinks  ?  Comment: very rare  ? Drug use: No  ?  ? ?  ?OPHTHALMIC EXAM: ? ?Base Eye Exam   ? ? Visual Acuity (Snellen - Linear)   ? ?   Right Left  ? Dist cc 20/30 +2 20/25 +2  ? Dist ph cc 20/25 +1 NI  ? ? Correction: Glasses  ? ?  ?  ? ? Tonometry (Tonopen, 8:11 AM)   ? ?  Right Left  ? Pressure 14 13  ? ?  ?  ? ? Pupils   ? ?   Dark Light Shape React APD  ? Right 4 3 Round Brisk None  ? Left 4 3 Round Brisk None  ? ?  ?  ? ? Visual Fields (Counting fingers)   ? ?   Left Right  ?  Full Full  ? ?  ?  ? ? Extraocular Movement   ? ?   Right Left  ?  Full, Ortho Full, Ortho  ? ?  ?  ? ? Neuro/Psych   ? ? Oriented x3: Yes  ? Mood/Affect: Normal  ? ?  ?  ? ? Dilation   ? ? Both eyes: 1.0% Mydriacyl, 2.5% Phenylephrine @ 8:12 AM  ? ?  ?  ? ?  ? ?Slit Lamp and Fundus Exam   ? ? Slit Lamp Exam   ? ?   Right Left  ? Lids/Lashes Dermatochalasis - upper lid Dermatochalasis - upper lid  ? Conjunctiva/Sclera White and quiet temporal pinguecula  ? Cornea mild arcus, EBMD, 1+Punctate epithelial erosions Mild arcus, EBMD, 2-3+ Punctate epithelial erosions  ? Anterior Chamber deep, clear, narrow temporal angle Deep and clear  ? Iris Round and dilated Round and dilated, No NVI  ? Lens PC IOL in good position PC IOL in good position  ? Anterior Vitreous Vitreous syneresis, Posterior vitreous detachment Vitreous syneresis, PVD, +residual RBC, persistent white blood stained vitreous condensations settled inferiorly  ? ?  ?  ? ? Fundus Exam   ? ?   Right Left  ? Disc Pink and Sharp Mild pallor, sharp rim, PPP  ? C/D Ratio 0.2 0.2  ? Macula Flat, Good foveal reflex, RPE mottling, scattered MA, No edema Flat, blunted foveal reflex, mild RPE mottling, rare MA, No edema  ? Vessels attenuated, Tortuous attenuated, Tortuous  ? Periphery Attached, scattered MA, few DBH inferiorly Inferior periphery obscured by old blood clots, otherwise attached, rare, scattered MA/DBH, no RT/RD  ? ?  ?  ? ?   ? ?Refraction   ? ? Wearing Rx   ? ?   Sphere Cylinder Axis Add  ? Right -0.50 +0.50 153 +2.50  ? Left -0.25 +1.50 160 +2.50  ? ? Type: PAL  ? ?  ?  ? ? Manifest Refraction   ? ?   Sphere Cylinder Axis Dist VA

## 2021-09-25 DIAGNOSIS — E039 Hypothyroidism, unspecified: Secondary | ICD-10-CM | POA: Diagnosis not present

## 2021-09-25 DIAGNOSIS — K760 Fatty (change of) liver, not elsewhere classified: Secondary | ICD-10-CM | POA: Diagnosis not present

## 2021-09-25 DIAGNOSIS — I10 Essential (primary) hypertension: Secondary | ICD-10-CM | POA: Diagnosis not present

## 2021-09-25 DIAGNOSIS — E10319 Type 1 diabetes mellitus with unspecified diabetic retinopathy without macular edema: Secondary | ICD-10-CM | POA: Diagnosis not present

## 2021-09-25 DIAGNOSIS — H4312 Vitreous hemorrhage, left eye: Secondary | ICD-10-CM | POA: Diagnosis not present

## 2021-09-25 DIAGNOSIS — E785 Hyperlipidemia, unspecified: Secondary | ICD-10-CM | POA: Diagnosis not present

## 2021-09-25 DIAGNOSIS — H3581 Retinal edema: Secondary | ICD-10-CM | POA: Diagnosis not present

## 2021-09-30 ENCOUNTER — Other Ambulatory Visit: Payer: Self-pay

## 2021-09-30 ENCOUNTER — Encounter (INDEPENDENT_AMBULATORY_CARE_PROVIDER_SITE_OTHER): Payer: Self-pay | Admitting: Ophthalmology

## 2021-09-30 ENCOUNTER — Ambulatory Visit (INDEPENDENT_AMBULATORY_CARE_PROVIDER_SITE_OTHER): Payer: Medicare Other | Admitting: Ophthalmology

## 2021-09-30 DIAGNOSIS — H4312 Vitreous hemorrhage, left eye: Secondary | ICD-10-CM

## 2021-09-30 DIAGNOSIS — H35033 Hypertensive retinopathy, bilateral: Secondary | ICD-10-CM | POA: Diagnosis not present

## 2021-09-30 DIAGNOSIS — I10 Essential (primary) hypertension: Secondary | ICD-10-CM

## 2021-09-30 DIAGNOSIS — Z961 Presence of intraocular lens: Secondary | ICD-10-CM

## 2021-09-30 DIAGNOSIS — E113393 Type 2 diabetes mellitus with moderate nonproliferative diabetic retinopathy without macular edema, bilateral: Secondary | ICD-10-CM

## 2021-10-05 ENCOUNTER — Encounter (INDEPENDENT_AMBULATORY_CARE_PROVIDER_SITE_OTHER): Payer: Self-pay | Admitting: Ophthalmology

## 2021-10-28 DIAGNOSIS — Z20822 Contact with and (suspected) exposure to covid-19: Secondary | ICD-10-CM | POA: Diagnosis not present

## 2021-11-12 ENCOUNTER — Other Ambulatory Visit: Payer: Self-pay | Admitting: Endocrinology

## 2021-11-12 ENCOUNTER — Telehealth: Payer: Self-pay | Admitting: Cardiovascular Disease

## 2021-11-12 DIAGNOSIS — Z1231 Encounter for screening mammogram for malignant neoplasm of breast: Secondary | ICD-10-CM

## 2021-11-12 NOTE — Telephone Encounter (Signed)
Spoke w/ pt.  She reports that she was taking a generic eye drop and did not realize it was an antihistamine and thinks it contributed to her recent PVCs.  ?She stopped the eye drops about a week ago and her PVCs have improved, but her BP has been running low, particularly her diastolic. ?She has been feeling dizzy for the past couple of days, but is feeling better today.  ?She has not had anything to eat or drink this morning b/c she "can't eat before I take my BP" and thinks she may be a bit dehydrated.  ?She has not taken any propranolol, as her BP has been low and her pills expired in 2020. ?She has appt w/ Dr. Rockey Situ tomorrow @ 3:00.  ?Advised her to eat and increase her fluids and see if she feels better.  ?Advised her to change positions and stand slowly.  ?She is agreeable and will keep her appt tomorrow.  ?

## 2021-11-12 NOTE — Telephone Encounter (Signed)
Pt c/o BP issue: STAT if pt c/o blurred vision, one-sided weakness or slurred speech ? ?1. What are your last 5 BP readings? ,-136/62 at this time,  108/41, 118/41, earlier this morning yesterday 126/48, 115/41 ? ?2. Are you having any other symptoms (ex. Dizziness, headache, blurred vision, passed out)? Very dizzy , but not at this time ? ?3. What is your BP issue? Made an appointment for tomorrow with Dr Rockey Situ, please call to evaluate ? ?

## 2021-11-13 ENCOUNTER — Encounter: Payer: Self-pay | Admitting: Cardiovascular Disease

## 2021-11-13 ENCOUNTER — Ambulatory Visit (INDEPENDENT_AMBULATORY_CARE_PROVIDER_SITE_OTHER): Payer: Medicare Other | Admitting: Cardiovascular Disease

## 2021-11-13 ENCOUNTER — Ambulatory Visit
Admission: RE | Admit: 2021-11-13 | Discharge: 2021-11-13 | Disposition: A | Discharge status: Home / Self Care | Payer: Medicare Other | Source: Ambulatory Visit | Attending: Endocrinology | Admitting: Endocrinology

## 2021-11-13 VITALS — BP 150/68 | HR 63 | Ht 66.0 in | Wt 147.2 lb

## 2021-11-13 DIAGNOSIS — I10 Essential (primary) hypertension: Secondary | ICD-10-CM

## 2021-11-13 DIAGNOSIS — Z794 Long term (current) use of insulin: Secondary | ICD-10-CM

## 2021-11-13 DIAGNOSIS — E119 Type 2 diabetes mellitus without complications: Secondary | ICD-10-CM

## 2021-11-13 DIAGNOSIS — I493 Ventricular premature depolarization: Secondary | ICD-10-CM | POA: Diagnosis not present

## 2021-11-13 DIAGNOSIS — Z1231 Encounter for screening mammogram for malignant neoplasm of breast: Secondary | ICD-10-CM

## 2021-11-13 MED ORDER — PROPRANOLOL HCL 20 MG PO TABS: 10.0000 mg | ORAL_TABLET | Freq: Three times a day (TID) | ORAL | 0 refills | Status: DC | PRN

## 2021-11-13 NOTE — Progress Notes (Signed)
Cardiology Office Note ? ?Date:  11/13/2021  ? ?ID:  Jamie Mays, DOB [birth year], MRN [id number] ? ?PCP:  Reynold Bowen, MD  ? ?Chief Complaint  ?Patient presents with  ? Decreased blood pressure  ?  Patient c/o dizziness with low blood pressure readings. Medications reviewed by the patient verbally.   ? ? ?HPI:  ?[name] is a 71 year old woman with past medical history of ?Diabetes on insulin pump ?Hypertension ?Hypothyroidism ?PVCs ?Previously admission to the hospital April 2019 chest pain ?Who presents for follow-up of his palpitations, symptomatic PVCs ? ?In follow-up today reports having very low pressure earlier in the week ?Was dizzy/having orthostasis symptoms ?She held her irbesartan and HCTZ, blood pressure slowly improving over the next 2 days ?Thinks that maybe she got very dehydrated, has not been urinating much ? ?Blood pressure elevated in the office today, was rushing in 737 up to 106 systolic ? ?Having PVCs on her visit today, symptomatic ?Has not been taking propranolol, medication is old and needs a refill ?Not particular interested in antiarrhythmic medication ? ?Recent stressors bringing on her PVCs ?Reports she is in the process of moving ? ?EKG personally reviewed by myself on todays visit ?Normal sinus rhythm rate 65 bpm right bundle branch block, no significant ST-T wave changes ? ?Other past medical history reviewed ?April 2019 had stress test, low risk ? ?Total cholesterol 112 LDL 46 potassium 4.8 normal LFTs TSH 0.44 vitamin D 31 ? ? ?PMH:   has a past medical history of Ankle fracture, Arthritis, Diabetes mellitus, Diabetic retinopathy ([place]), Family history of adverse reaction to anesthesia, Family history of MI (myocardial infarction), GERD (gastroesophageal reflux disease), Hypertension, Hypertensive retinopathy, Hypothyroidism, Painful orthopaedic hardware ([place]), PVC (premature ventricular contraction), PVC's (premature ventricular contractions), Smoker, Syncope, Vitreous  hemorrhage of left eye ([place]), and Wears glasses. ? ?PSH:    ?Past Surgical History:  ?Procedure Laterality Date  ? CARDIAC CATHETERIZATION  2007  ? neg  ? CATARACT EXTRACTION    ? CATARACT EXTRACTION W/ INTRAOCULAR LENS  IMPLANT, BILATERAL    ? CESAREAN SECTION    ? DUPUYTREN CONTRACTURE RELEASE Left 07/20/2021  ? Procedure: DUPUYTREN CONTRACTURE RELEASE, left small finger;  Surgeon: Orene Desanctis, MD;  Location: [facility];  Service: Orthopedics;  Laterality: Left;  ? EYE SURGERY    ? HARDWARE REMOVAL Left 03/19/2020  ? Procedure: HARDWARE REMOVAL LEFT ANKLE;  Surgeon: Shona Needles, MD;  Location: [place];  Service: Orthopedics;  Laterality: Left;  ? ORIF ANKLE FRACTURE Left 07/19/2019  ? Procedure: OPEN REDUCTION INTERNAL FIXATION (ORIF) ANKLE FRACTURE;  Surgeon: Shona Needles, MD;  Location: [place];  Service: Orthopedics;  Laterality: Left;  ? ? ?Current Outpatient Medications  ?Medication Sig Dispense Refill  ? ALPRAZolam (XANAX) 0.5 MG tablet take one by mouth prn anxiety    ? calcium carbonate (TUMS - DOSED IN MG ELEMENTAL CALCIUM) 500 MG chewable tablet 1 tablet    ? esomeprazole (NEXIUM) 20 MG capsule 1 capsule    ? glucose blood test strip 1 each by Other route 6 (six) times daily. Use as instructed     ? hydrochlorothiazide (MICROZIDE) 12.5 MG capsule Take 12.5 mg by mouth daily.    ? insulin aspart (NOVOLOG) 100 UNIT/ML injection Inject 75-100 Units into the skin daily. Via insulin pump as directed    ? irbesartan (AVAPRO) 150 MG tablet Take 150 mg by mouth daily.    ? levothyroxine (SYNTHROID, LEVOTHROID) 75 MCG tablet Take 1 tablet (  75 mcg total) by mouth daily. (Patient taking differently: Take 75 mcg by mouth See admin instructions. Take 75 mg on Mon, Tue, Thurs, Fri, and Sat. Skip Sun and Wed dose) 30 tablet 0  ? Magnesium 200 MG CHEW Chew 300 mg by mouth daily.    ? Omega-3 Fatty Acids (FISH OIL ADULT GUMMIES PO) Take 1 capsule by mouth daily.     ? propranolol (INDERAL) 20 MG tablet  TAKE 1 TABLET (20 MG TOTAL) BY MOUTH 3 (THREE) TIMES DAILY AS NEEDED. (Patient taking differently: Take 10-20 mg by mouth 3 (three) times daily as needed (palpitations).) 270 tablet 1  ? Turmeric 500 MG CAPS Take 500 mg by mouth daily.    ? Vitamin D, Ergocalciferol, (DRISDOL) 1.25 MG (50000 UT) CAPS capsule Take 50,000 Units by mouth every Tuesday.     ? XIIDRA 5 % SOLN Apply 1 drop to eye 2 (two) times daily. (Patient not taking: Reported on 06/17/2021)    ? ?No current facility-administered medications for this visit.  ? ? ? ?Allergies:   Doxycycline, Lisinopril, Minocycline, Oxycodone, and Prednisone  ? ?Social History:  The patient  reports that she quit smoking about 40 years ago. Her smoking use included cigarettes. She has never used smokeless tobacco. She reports that she does not currently use alcohol. She reports that she does not use drugs.  ? ?Family History:   family history includes Breast cancer in her niece; Cancer in her brother; Cancer (age of onset: 46) in her brother; Cancer (age of onset: 23) in an other family member; Coronary artery disease in her mother; Diabetes in her brother, maternal grandmother, mother, and sister; Heart disease in her mother; Hypertension in her mother; Kidney disease in her mother; Macular degeneration in her father.  ? ? ?Review of Systems: ?Review of Systems  ?Constitutional: Negative.   ?HENT: Negative.    ?Respiratory: Negative.    ?Cardiovascular:  Positive for palpitations.  ?Gastrointestinal: Negative.   ?Musculoskeletal: Negative.   ?Neurological:  Positive for dizziness.  ?Psychiatric/Behavioral: Negative.    ?All other systems reviewed and are negative. ? ? ?PHYSICAL EXAM: ?VS:  BP (!) 150/68   Pulse 63   Ht '5\' 6"'$  (1.676 m)   Wt 147 lb 4 oz (66.8 kg)   BMI 23.77 kg/m?  , BMI Body mass index is 23.77 kg/m?. ?GEN: Well nourished, well developed, in no acute distress ?HEENT: normal ?Neck: no JVD, carotid bruits, or masses ?Cardiac: RRR; no murmurs, rubs, or  gallops,no edema  ?Respiratory:  clear to auscultation bilaterally, normal work of breathing ?GI: soft, nontender, nondistended, + BS ?MS: no deformity or atrophy ?Skin: warm and dry, no rash ?Neuro:  Strength and sensation are intact ?Psych: euthymic mood, full affect ? ? ?Recent Labs: ?07/20/2021: BUN 22; Creatinine, Ser 0.60; Hemoglobin 17.3; Potassium 3.9; Sodium 141  ? ? ?Lipid Panel ?Lab Results  ?Component Value Date  ? CHOL 101 10/29/2008  ? HDL 34.10 (L) 10/29/2008  ? [place] 53 10/29/2008  ? TRIG 69.0 10/29/2008  ? ?  ? ?Wt Readings from Last 3 Encounters:  ?11/13/21 147 lb 4 oz (66.8 kg)  ?07/20/21 144 lb (65.3 kg)  ?03/19/20 142 lb (64.4 kg)  ?  ? ? ?ASSESSMENT AND PLAN: ? ?Problem List Items Addressed This Visit   ? ?  ? Cardiology Problems  ? Essential hypertension - Primary  ?  ? Other  ? Diabetes type 2, controlled ([place])  ? ?Other Visit Diagnoses   ? ? PVC (  premature ventricular contraction)      ? ?  ?PVCs ?Continues to have symptomatic PVCs ?Not particularly interested in antiarrhythmic therapy ?Feels her symptoms are secondary to stress ?Propranolol renewed ? ?Anxiety ?Stress reduction techniques recommended ? ?Diabetes on insulin ?Management primary care ? ?Hypertension ?Low blood pressure starting 2 days ago stopped her medications, likely dehydrated ?Drinking more blood pressure slowly coming back upwards. ?Recommend she slowly restart her irbesartan as blood pressure tolerates ?Depending on her activities in the hot sun over the summer, judicious use of her HCTZ ? ? Total encounter time more than 30 minutes ? Greater than 50% was spent in counseling and coordination of care with the patient ? ? ? ?Signed, ?Esmond Plants, M.D., Ph.D. ?[facility] [place], [place] ?[phone] ?

## 2021-11-13 NOTE — Patient Instructions (Signed)
Medication Instructions:  ?No changes ?Restart irbesartan once blood pressure improves after hydration ?Cautious restart of the HCTZ only if blood pressure allows ? ?If you need a refill on your cardiac medications before your next appointment, please call your pharmacy.  ? ?Lab work: ?No new labs needed ? ?Testing/Procedures: ?No new testing needed ? ?Follow-Up: ?At [facility], you and your health needs are our priority.  As part of our continuing mission to provide you with exceptional heart care, we have created designated Provider Care Teams.  These Care Teams include your primary Cardiologist (physician) and Advanced Practice Providers (APPs -  Physician Assistants and Nurse Practitioners) who all work together to provide you with the care you need, when you need it. ? ?You will need a follow up appointment in 12 months ? ?Providers on your designated Care Team:   ?Murray Hodgkins, NP ?Christell Faith, PA-C ?Cadence Kathlen Mody, PA-C ? ?COVID-19 Vaccine Information can be found at: [email or web address] For questions related to vaccine distribution or appointments, please email [email or web address] or call [phone].  ? ?

## 2021-11-17 NOTE — Addendum Note (Signed)
Addended by: Anselm Pancoast on: 11/17/2021 10:40 AM ? ? Modules accepted: Orders ? ?

## 2021-11-27 ENCOUNTER — Other Ambulatory Visit: Payer: Self-pay | Admitting: Cardiovascular Disease

## 2021-12-03 ENCOUNTER — Emergency Department (HOSPITAL_COMMUNITY)
Admission: EM | Admit: 2021-12-03 | Discharge: 2021-12-03 | Disposition: A | Discharge status: Home / Self Care | Payer: Medicare Other | Attending: Emergency Medicine | Admitting: Emergency Medicine

## 2021-12-03 ENCOUNTER — Other Ambulatory Visit: Payer: Self-pay

## 2021-12-03 ENCOUNTER — Encounter (HOSPITAL_COMMUNITY): Payer: Self-pay | Admitting: Emergency Medicine

## 2021-12-03 DIAGNOSIS — Z79899 Other long term (current) drug therapy: Secondary | ICD-10-CM | POA: Insufficient documentation

## 2021-12-03 DIAGNOSIS — R519 Headache, unspecified: Secondary | ICD-10-CM

## 2021-12-03 DIAGNOSIS — H43391 Other vitreous opacities, right eye: Secondary | ICD-10-CM | POA: Diagnosis not present

## 2021-12-03 DIAGNOSIS — G43B0 Ophthalmoplegic migraine, not intractable: Secondary | ICD-10-CM | POA: Insufficient documentation

## 2021-12-03 DIAGNOSIS — I10 Essential (primary) hypertension: Secondary | ICD-10-CM | POA: Insufficient documentation

## 2021-12-03 DIAGNOSIS — H5789 Other specified disorders of eye and adnexa: Secondary | ICD-10-CM | POA: Diagnosis present

## 2021-12-03 DIAGNOSIS — E11319 Type 2 diabetes mellitus with unspecified diabetic retinopathy without macular edema: Secondary | ICD-10-CM | POA: Diagnosis not present

## 2021-12-03 DIAGNOSIS — Z794 Long term (current) use of insulin: Secondary | ICD-10-CM | POA: Diagnosis not present

## 2021-12-03 LAB — BASIC METABOLIC PANEL
Anion gap: 7 (ref 5–15)
BUN: 17 mg/dL (ref 8–23)
CO2: 28 mmol/L (ref 22–32)
Calcium: 10.1 mg/dL (ref 8.9–10.3)
Chloride: 108 mmol/L (ref 98–111)
Creatinine, Ser: 0.89 mg/dL (ref 0.44–1.00)
GFR, Estimated: >60 mL/min (ref >60)
Glucose, Bld: 165 mg/dL — ABNORMAL HIGH (ref 70–99)
Potassium: 4.5 mmol/L (ref 3.5–5.1)
Sodium: 143 mmol/L (ref 135–145)

## 2021-12-03 LAB — CBC WITH DIFFERENTIAL/PLATELET
Abs Immature Granulocytes: 0.03 10*3/uL (ref 0.00–0.07)
Basophils Absolute: 0.1 10*3/uL (ref 0.0–0.1)
Basophils Relative: 1 %
Eosinophils Absolute: 0.2 10*3/uL (ref 0.0–0.5)
Eosinophils Relative: 3 %
HCT: 43.7 % (ref 36.0–46.0)
Hemoglobin: 14.9 g/dL (ref 12.0–15.0)
Immature Granulocytes: 0 %
Lymphocytes Relative: 31 %
Lymphs Abs: 2.2 10*3/uL (ref 0.7–4.0)
MCH: 30.5 pg (ref 26.0–34.0)
MCHC: 34.1 g/dL (ref 30.0–36.0)
MCV: 89.5 fL (ref 80.0–100.0)
Monocytes Absolute: 0.6 10*3/uL (ref 0.1–1.0)
Monocytes Relative: 8 %
Neutro Abs: 4.1 10*3/uL (ref 1.7–7.7)
Neutrophils Relative %: 57 %
Platelets: 212 10*3/uL (ref 150–400)
RBC: 4.88 MIL/uL (ref 3.87–5.11)
RDW: 13.3 % (ref 11.5–15.5)
WBC: 7.2 10*3/uL (ref 4.0–10.5)
nRBC: 0 % (ref 0.0–0.2)

## 2021-12-03 NOTE — ED Triage Notes (Signed)
Presents for frontal headache since this afternoon (3/10 now). Started as pain behind L eye and R eye vision disturbance ("seeing flashing lights and squiggles"). H/o hemorrhage involving L eye.  Tried '1000mg'$  tylenol 1 hr ago without relief.  No h/o migraines or strokes. Does not use a blood thinner.  Currently denying vision disturbance. Endorse dizziness for weeks, was told that she was dehydrated and decreased her BP medications for this.

## 2021-12-03 NOTE — ED Notes (Signed)
Pt states headache is now 0/10, would like to just go home and schedule appt with eye doctor. Roxanne Mins MD made aware

## 2021-12-03 NOTE — ED Notes (Signed)
Rn reviewed discharge instructions with pt. Pt verbalized understanding and had no further questions. VSS upon discharge 

## 2021-12-03 NOTE — Discharge Instructions (Signed)
The squiggles that you had in your right eye or probably floaters.  However, a detached retina can sometimes cause the same symptoms.  Please see your eye doctor to do a thorough eye exam.  Please see your primary care provider to decide whether you should have evaluation of the arteries in your neck (carotid duplex scan).  Please return immediately if your symptoms come back.

## 2021-12-03 NOTE — ED Provider Triage Note (Addendum)
Emergency Medicine Provider Triage Evaluation Note  Jamie Mays , a 71 y.o. female  was evaluated in triage.  Pt complains of blurred vision in the right eye followed by streaking that started around 2 PM today.  This is since resolved since being in the department.  Patient also complaining of a left sided headache behind the left eye.  No focal weakness or numbness.  Review of Systems  Positive:  Negative: See above  Physical Exam  BP (!) 161/64 (BP Location: Right Arm)   Pulse 67   Temp 98.2 F (36.8 C) (Oral)   Resp 18   Wt 64.9 kg   SpO2 99%   BMI 23.08 kg/m  Gen:   Awake, no distress   Resp:  Normal effort  MSK:   Moves extremities without difficulty  Other:  Cranial nerves are intact.  5/5 strength to the upper and lower extremities.  Normal sensation to the upper and lower extremities.  Medical Decision Making  Medically screening exam initiated at 9:05 PM.  Appropriate orders placed.  GEARLDEAN LOMANTO was informed that the remainder of the evaluation will be completed by another provider, this initial triage assessment does not replace that evaluation, and the importance of remaining in the ED until their evaluation is complete.     Hendricks Limes, PA-C 12/03/21 2106    Hendricks Limes, PA-C 12/03/21 2112

## 2021-12-03 NOTE — ED Provider Notes (Signed)
[facility] EMERGENCY DEPARTMENT Provider Note   CSN: [id number] Arrival date & time: 12/03/21  2018     History  Chief Complaint  Patient presents with   Headache    Jamie Mays is a 71 y.o. female.  The history is provided by the patient.  Headache She has history of hypertension, diabetes with diabetic retinopathy, hyperlipidemia and comes in because of seeing squiggly lines in front of her right.  She was bending over cleaning a trunk, and when she stood up, she noted squiggly lines in front of her right eye.  This lasted about 10 minutes before resolving, but then did recur.  She then developed a headache which was behind her left eye.  She did not have any visual problems with her left eye.  She does have history of vitreous hemorrhage in the left eye.  She denies any difficulty talking, and denies any weakness or numbness.  Her left eye headache has resolved and the squiggly lines have also resolved.  She does have history of floaters, but states that those are usually black and the squiggly lines she saw were white.  She feels that she is back to her baseline now.   Home Medications Prior to Admission medications   Medication Sig Start Date End Date Taking? Authorizing Provider  ALPRAZolam Duanne Moron) 0.5 MG tablet take one by mouth prn anxiety 10/09/19   [provider]  calcium carbonate (TUMS - DOSED IN MG ELEMENTAL CALCIUM) 500 MG chewable tablet 1 tablet    [provider]  esomeprazole (NEXIUM) 20 MG capsule 1 capsule    [provider]  glucose blood test strip 1 each by Other route 6 (six) times daily. Use as instructed     [provider]  hydrochlorothiazide (MICROZIDE) 12.5 MG capsule Take 12.5 mg by mouth daily. 06/02/19   [provider]  insulin aspart (NOVOLOG) 100 UNIT/ML injection Inject 75-100 Units into the skin daily. Via insulin pump as directed    [provider]  irbesartan (AVAPRO) 150  MG tablet Take 150 mg by mouth daily.    [provider]  levothyroxine (SYNTHROID, LEVOTHROID) 75 MCG tablet Take 1 tablet (75 mcg total) by mouth daily. Patient taking differently: Take 75 mcg by mouth See admin instructions. Take 75 mg on Mon, Tue, Thurs, Fri, and Sat. Skip Sun and Wed dose 03/29/16   Dickie La, MD  Magnesium 200 MG CHEW Chew 300 mg by mouth daily.    [provider]  Omega-3 Fatty Acids (FISH OIL ADULT GUMMIES PO) Take 1 capsule by mouth daily.     [provider]  propranolol (INDERAL) 20 MG tablet Take 0.5-1 tablets (10-20 mg total) by mouth 3 (three) times daily as needed (palpitations). 11/13/21   Minna Merritts, MD  Turmeric 500 MG CAPS Take 500 mg by mouth daily.    [provider]  Vitamin D, Ergocalciferol, (DRISDOL) 1.25 MG (50000 UT) CAPS capsule Take 50,000 Units by mouth every Tuesday.  03/14/19   [provider]  XIIDRA 5 % SOLN Apply 1 drop to eye 2 (two) times daily. Patient not taking: Reported on 06/17/2021 05/15/21   [provider]      Allergies    Doxycycline, Lisinopril, Minocycline, Oxycodone, and Prednisone    Review of Systems   Review of Systems  Neurological:  Positive for headaches.  All other systems reviewed and are negative.  Physical Exam Updated Vital Signs BP (!) 161/64 (  BP Location: Right Arm)   Pulse 67   Temp 98.2 F (36.8 C) (Oral)   Resp 18   Wt 64.9 kg   SpO2 99%   BMI 23.08 kg/m  Physical Exam Vitals and nursing note reviewed.  71 year old female, resting comfortably and in no acute distress. Vital signs are significant for elevated blood pressure. Oxygen saturation is 99%, which is normal. Head is normocephalic and atraumatic. PERRLA, EOMI. Oropharynx is clear.  Fundi show no hemorrhage or exudate. Neck is nontender and supple without adenopathy or JVD.  There are no carotid bruits. Back is nontender and there is no CVA tenderness. Lungs are clear without rales,  wheezes, or rhonchi. Chest is nontender. Heart has regular rate and rhythm without murmur. Abdomen is soft, flat, nontender without masses or hepatosplenomegaly and peristalsis is normoactive. Extremities have no cyanosis or edema, full range of motion is present. Skin is warm and dry without rash. Neurologic: Mental status is normal, cranial nerves are intact, strength is 5/5 in all 4 extremities.  There is no pronator drift, no extinction on double simultaneous stimulation.  Station and gait are normal.  ED Results / Procedures / Treatments   Labs (all labs ordered are listed, but only abnormal results are displayed) Labs Reviewed  BASIC METABOLIC PANEL - Abnormal; Notable for the following components:      Result Value   Glucose, Bld 165 (*)    All other components within normal limits  CBC WITH DIFFERENTIAL/PLATELET   Procedures Procedures    Medications Ordered in ED Medications - No data to display  ED Course/ Medical Decision Making/ A&P                           Medical Decision Making  Visual streaks seen in the right eye which have resolved.  These are likely visual floaters, consider retinal detachment.  This does not seem like typical amaurosis fugax, but patient clearly is at risk for stroke and may benefit from outpatient evaluation.  At this point, I do not feel any further ED evaluation is necessary, and patient is anxious to go home as she needs to refill her insulin pump.  I have independently reviewed and interpreted her laboratory testing and labs are normal except for minimally elevated glucose of 165.  She is to contact her ophthalmologist in the morning for close follow-up, and is to follow-up with her primary care provider to decide whether to pursue outpatient TIA testing.  Unfortunately, she is not a candidate for aspirin use because of history of vitreous hemorrhage.  Final Clinical Impression(s) / ED Diagnoses Final diagnoses:  Vitreous floaters of right  eye  Retro-ocular headache  Elevated blood pressure reading with diagnosis of hypertension    Rx / DC Orders ED Discharge Orders     None         Delora Fuel, MD 09/62/83 2349

## 2021-12-04 ENCOUNTER — Encounter (HOSPITAL_COMMUNITY): Payer: Self-pay

## 2021-12-04 ENCOUNTER — Emergency Department (HOSPITAL_COMMUNITY)
Admission: EM | Admit: 2021-12-04 | Discharge: 2021-12-04 | Disposition: A | Discharge status: Home / Self Care | Payer: Medicare Other | Attending: Emergency Medicine | Admitting: Emergency Medicine

## 2021-12-04 ENCOUNTER — Emergency Department (HOSPITAL_COMMUNITY): Payer: Medicare Other

## 2021-12-04 ENCOUNTER — Other Ambulatory Visit: Payer: Self-pay

## 2021-12-04 DIAGNOSIS — Z79899 Other long term (current) drug therapy: Secondary | ICD-10-CM | POA: Diagnosis not present

## 2021-12-04 DIAGNOSIS — Z794 Long term (current) use of insulin: Secondary | ICD-10-CM | POA: Insufficient documentation

## 2021-12-04 DIAGNOSIS — R251 Tremor, unspecified: Secondary | ICD-10-CM | POA: Diagnosis not present

## 2021-12-04 DIAGNOSIS — H538 Other visual disturbances: Secondary | ICD-10-CM | POA: Diagnosis not present

## 2021-12-04 DIAGNOSIS — G43B1 Ophthalmoplegic migraine, intractable: Secondary | ICD-10-CM | POA: Diagnosis not present

## 2021-12-04 DIAGNOSIS — I10 Essential (primary) hypertension: Secondary | ICD-10-CM | POA: Insufficient documentation

## 2021-12-04 DIAGNOSIS — Z20822 Contact with and (suspected) exposure to covid-19: Secondary | ICD-10-CM | POA: Insufficient documentation

## 2021-12-04 DIAGNOSIS — E119 Type 2 diabetes mellitus without complications: Secondary | ICD-10-CM | POA: Insufficient documentation

## 2021-12-04 DIAGNOSIS — R197 Diarrhea, unspecified: Secondary | ICD-10-CM | POA: Diagnosis not present

## 2021-12-04 DIAGNOSIS — E039 Hypothyroidism, unspecified: Secondary | ICD-10-CM | POA: Insufficient documentation

## 2021-12-04 DIAGNOSIS — R079 Chest pain, unspecified: Secondary | ICD-10-CM | POA: Diagnosis not present

## 2021-12-04 LAB — BASIC METABOLIC PANEL
Anion gap: 7 (ref 5–15)
BUN: 18 mg/dL (ref 8–23)
CO2: 28 mmol/L (ref 22–32)
Calcium: 9.7 mg/dL (ref 8.9–10.3)
Chloride: 107 mmol/L (ref 98–111)
Creatinine, Ser: 0.79 mg/dL (ref 0.44–1.00)
GFR, Estimated: >60 mL/min (ref >60)
Glucose, Bld: 139 mg/dL — ABNORMAL HIGH (ref 70–99)
Potassium: 4.3 mmol/L (ref 3.5–5.1)
Sodium: 142 mmol/L (ref 135–145)

## 2021-12-04 LAB — CBC WITH DIFFERENTIAL/PLATELET
Abs Immature Granulocytes: 0.02 10*3/uL (ref 0.00–0.07)
Basophils Absolute: 0.1 10*3/uL (ref 0.0–0.1)
Basophils Relative: 1 %
Eosinophils Absolute: 0.1 10*3/uL (ref 0.0–0.5)
Eosinophils Relative: 2 %
HCT: 44.2 % (ref 36.0–46.0)
Hemoglobin: 15 g/dL (ref 12.0–15.0)
Immature Granulocytes: 0 %
Lymphocytes Relative: 26 %
Lymphs Abs: 1.6 10*3/uL (ref 0.7–4.0)
MCH: 30.4 pg (ref 26.0–34.0)
MCHC: 33.9 g/dL (ref 30.0–36.0)
MCV: 89.5 fL (ref 80.0–100.0)
Monocytes Absolute: 0.5 10*3/uL (ref 0.1–1.0)
Monocytes Relative: 9 %
Neutro Abs: 3.8 10*3/uL (ref 1.7–7.7)
Neutrophils Relative %: 62 %
Platelets: 195 10*3/uL (ref 150–400)
RBC: 4.94 MIL/uL (ref 3.87–5.11)
RDW: 13.2 % (ref 11.5–15.5)
WBC: 6.1 10*3/uL (ref 4.0–10.5)
nRBC: 0 % (ref 0.0–0.2)

## 2021-12-04 LAB — SARS CORONAVIRUS 2 BY RT PCR: SARS Coronavirus 2 by RT PCR: NEGATIVE

## 2021-12-04 LAB — TROPONIN I (HIGH SENSITIVITY)
Troponin I (High Sensitivity): 5 ng/L (ref <18)
Troponin I (High Sensitivity): 5 ng/L (ref <18)

## 2021-12-04 LAB — CBG MONITORING, ED: Glucose-Capillary: 114 mg/dL — ABNORMAL HIGH (ref 70–99)

## 2021-12-04 MED ORDER — ACETAMINOPHEN 500 MG PO TABS
1000.0000 mg | ORAL_TABLET | Freq: Once | ORAL | Status: AC
  Administered 2021-12-04: 1000 mg via ORAL
  Filled 2021-12-04: qty 2

## 2021-12-04 NOTE — ED Notes (Signed)
Ambulated to restroom without assistance 

## 2021-12-04 NOTE — Discharge Instructions (Signed)
You have been evaluated for your symptoms.  Fortunately no evidence of heart injury noted on today's exam. Please follow-up closely with the eye specialist for further evaluation of your vision changes and for further work-up.  Return if you have any concern.

## 2021-12-04 NOTE — ED Provider Notes (Signed)
[facility] EMERGENCY DEPARTMENT Provider Note   CSN: [id number] Arrival date & time: 12/04/21  0439     History  Chief Complaint  Patient presents with   Shaking    Jamie Mays is a 71 y.o. female with history of type 2 diabetes on insulin pump, hyperlipidemia, anxiety, hypothyroidism, hypertension who was seen in the emergency department last night for changes in her vision and headache associated with this but left prior to completion of work-up who returns this evening with concern for waking from her sleep with heartburn symptoms with central chest burning, whole body tremors, dry mouth, and single episode of watery diarrhea without melena or hematochezia.  Denies any headache or vision changes at this time.  Denies chest pain or shortness of breath or palpitations at this time.  States she feels it was likely anxiety and combination with heartburn from the piece that she ate for supper. She also states that she had a hard time swallowing at first upon her waking.   I personally reviewed her medical records.  In addition to the above listed history patient has history of hypertension.  She is not anticoagulated.  HPI     Home Medications Prior to Admission medications   Medication Sig Start Date End Date Taking? Authorizing Provider  calcium carbonate (TUMS - DOSED IN MG ELEMENTAL CALCIUM) 500 MG chewable tablet Chew 2 tablets by mouth daily as needed for indigestion or heartburn.   Yes [provider]  Famotidine (ZANTAC 360 PO) Take 360 mg by mouth daily as needed (heartburn).   Yes [provider]  insulin aspart (NOVOLOG) 100 UNIT/ML injection Inject 75-100 Units into the skin daily. Via insulin pump as directed   Yes [provider]  irbesartan (AVAPRO) 150 MG tablet Take 150 mg by mouth daily.   Yes [provider]  levothyroxine (SYNTHROID, LEVOTHROID) 75 MCG tablet Take 1 tablet (75 mcg total) by mouth  daily. Patient taking differently: Take 75 mcg by mouth See admin instructions. Take 75 mg on Mon, Tue, Thurs, Fri, and Sat.  Skip Sun and Wed dose 03/29/16  Yes Dickie La, MD  Magnesium 200 MG TABS Take 400 mg by mouth daily.   Yes [provider]  Omega-3 Fatty Acids (FISH OIL ADULT GUMMIES PO) Take 2 capsules by mouth daily.   Yes [provider]  propranolol (INDERAL) 20 MG tablet Take 0.5-1 tablets (10-20 mg total) by mouth 3 (three) times daily as needed (palpitations). 11/13/21  Yes Gollan, Kathlene November, MD  Turmeric 500 MG CAPS Take 500 mg by mouth daily.   Yes [provider]  Vitamin D, Ergocalciferol, (DRISDOL) 1.25 MG (50000 UT) CAPS capsule Take 50,000 Units by mouth every Tuesday.  03/14/19  Yes [provider]  XIIDRA 5 % SOLN Apply 1 drop to eye 2 (two) times daily. 05/15/21  Yes [provider]  glucose blood test strip 1 each by Other route 6 (six) times daily. Use as instructed     [provider]      Allergies    Doxycycline, Lisinopril, Minocycline, Oxycodone, and Prednisone    Review of Systems   Review of Systems  Constitutional: Negative.   HENT: Negative.    Eyes: Negative.   Respiratory: Negative.  Negative for shortness of breath.   Cardiovascular:  Negative for chest pain, palpitations and leg swelling.  Gastrointestinal:  Positive for abdominal pain and diarrhea.  Genitourinary: Negative.   Neurological:  Positive for  tremors. Negative for dizziness, syncope, facial asymmetry, speech difficulty, light-headedness and headaches.   Physical Exam Updated Vital Signs BP (!) 150/58 (BP Location: Right Arm)   Pulse 69   Temp 98.7 F (37.1 C) (Oral)   Resp 18   Ht '5\' 6"'$  (1.676 m)   Wt 64.9 kg   SpO2 97%   BMI 23.08 kg/m  Physical Exam Vitals and nursing note reviewed.  Constitutional:      Appearance: She is not ill-appearing or toxic-appearing.  HENT:     Head: Normocephalic and atraumatic.     Nose:  Nose normal.     Mouth/Throat:     Mouth: Mucous membranes are moist.     Pharynx: No oropharyngeal exudate or posterior oropharyngeal erythema.  Eyes:     General: Lids are normal. Vision grossly intact.        Right eye: No discharge.        Left eye: No discharge.     Extraocular Movements: Extraocular movements intact.     Conjunctiva/sclera: Conjunctivae normal.     Pupils: Pupils are equal, round, and reactive to light.     Visual Fields: Right eye visual fields normal and left eye visual fields normal.  Cardiovascular:     Rate and Rhythm: Normal rate and regular rhythm.     Pulses: Normal pulses.     Heart sounds: No murmur heard. Pulmonary:     Effort: Pulmonary effort is normal. No respiratory distress.     Breath sounds: Normal breath sounds. No wheezing or rales.  Abdominal:     General: Bowel sounds are normal. There is no distension.     Palpations: Abdomen is soft.     Tenderness: There is no abdominal tenderness. There is no right CVA tenderness, left CVA tenderness, guarding or rebound.  Musculoskeletal:        General: No deformity.     Cervical back: Neck supple. No tenderness.     Right lower leg: No edema.     Left lower leg: No edema.  Lymphadenopathy:     Cervical: No cervical adenopathy.  Skin:    General: Skin is warm and dry.     Capillary Refill: Capillary refill takes less than 2 seconds.  Neurological:     General: No focal deficit present.     Mental Status: She is alert and oriented to person, place, and time. Mental status is at baseline.     GCS: GCS eye subscore is 4. GCS verbal subscore is 5. GCS motor subscore is 6.     Cranial Nerves: Cranial nerves 2-12 are intact.     Sensory: Sensation is intact.     Motor: Motor function is intact.     Coordination: Coordination is intact.  Psychiatric:        Mood and Affect: Mood normal.    ED Results / Procedures / Treatments   Labs (all labs ordered are listed, but only abnormal results are  displayed) Labs Reviewed  SARS CORONAVIRUS 2 BY RT PCR  CBC WITH DIFFERENTIAL/PLATELET  BASIC METABOLIC PANEL  CBG MONITORING, ED  TROPONIN I (HIGH SENSITIVITY)    EKG EKG Interpretation  Date/Time:  Friday Dec 04 2021 04:59:29 EDT Ventricular Rate:  66 PR Interval:  153 QRS Duration: 139 QT Interval:  445 QTC Calculation: 467 R Axis:   122 Text Interpretation: Sinus rhythm Right bundle branch block Confirmed by Randal Buba, April ([id number]) on 12/04/2021 5:01:10 AM  Radiology CT HEAD WO CONTRAST (5MM)  Result Date: 12/04/2021 CLINICAL DATA:  Floaters with transient blurry vision EXAM: CT HEAD AND ORBITS WITHOUT CONTRAST TECHNIQUE: Contiguous axial images were obtained from the base of the skull through the vertex without contrast. Multidetector CT imaging of the orbits was performed using the standard protocol without intravenous contrast. RADIATION DOSE REDUCTION: This exam was performed according to the departmental dose-optimization program which includes automated exposure control, adjustment of the mA and/or kV according to patient size and/or use of iterative reconstruction technique. COMPARISON:  None Available. FINDINGS: CT HEAD FINDINGS Brain: No evidence of acute infarction, hemorrhage, hydrocephalus, extra-axial collection or mass lesion/mass effect. Partially empty sella, incidental in isolation. Vascular: No hyperdense vessel or unexpected calcification. Mild vertebrobasilar tortuosity/elongation. Skull: Normal. Negative for fracture or focal lesion. CT ORBITS FINDINGS Orbits: Bilateral cataract resection. Unremarkable appearance of the orbits, optic nerve sheath complexes, extraocular muscles, orbital fat, and superior ophthalmic veins. Visualized sinuses: Clear Soft tissues: Negative IMPRESSION: No acute finding or explanation for symptoms. Electronically Signed   By: Jorje Guild M.D.   On: 12/04/2021 06:24   DG Chest Portable 1 View  Result Date: 12/04/2021 CLINICAL DATA:   Chest pain EXAM: PORTABLE CHEST 1 VIEW COMPARISON:  11/06/2017 FINDINGS: 0529 hours. The lungs are clear without focal pneumonia, edema, pneumothorax or pleural effusion. Chronic interstitial coarsening noted at the bases. The cardiopericardial silhouette is within normal limits for size. The visualized bony structures of the thorax are unremarkable. Telemetry leads overlie the chest. IMPRESSION: No active disease. Hyperexpansion with chronic interstitial coarsening. Electronically Signed   By: Misty Stanley M.D.   On: 12/04/2021 05:49   CT Orbits Wo Contrast  Result Date: 12/04/2021 CLINICAL DATA:  Floaters with transient blurry vision EXAM: CT HEAD AND ORBITS WITHOUT CONTRAST TECHNIQUE: Contiguous axial images were obtained from the base of the skull through the vertex without contrast. Multidetector CT imaging of the orbits was performed using the standard protocol without intravenous contrast. RADIATION DOSE REDUCTION: This exam was performed according to the departmental dose-optimization program which includes automated exposure control, adjustment of the mA and/or kV according to patient size and/or use of iterative reconstruction technique. COMPARISON:  None Available. FINDINGS: CT HEAD FINDINGS Brain: No evidence of acute infarction, hemorrhage, hydrocephalus, extra-axial collection or mass lesion/mass effect. Partially empty sella, incidental in isolation. Vascular: No hyperdense vessel or unexpected calcification. Mild vertebrobasilar tortuosity/elongation. Skull: Normal. Negative for fracture or focal lesion. CT ORBITS FINDINGS Orbits: Bilateral cataract resection. Unremarkable appearance of the orbits, optic nerve sheath complexes, extraocular muscles, orbital fat, and superior ophthalmic veins. Visualized sinuses: Clear Soft tissues: Negative IMPRESSION: No acute finding or explanation for symptoms. Electronically Signed   By: Jorje Guild M.D.   On: 12/04/2021 06:24     Procedures Procedures  Medications Ordered in ED Medications - No data to display  ED Course/ Medical Decision Making/ A&P                           Medical Decision Making 71 year old female presents with concern for tremors, chest burning, single episode of diarrhea that woke her from her sleep.  Hypertensive on intake, vital signs otherwise normal.  Cardiopulmonary and abdominal exams are benign.  Patient is neurologically intact without focal deficit on exam and neurovascular intact in all extremities without edema.  Amount and/or Complexity of Data Reviewed Independent Historian: spouse Labs: ordered. Radiology: ordered.    Details: CXR negative for acute cardiopulmonary disease. CT head and orbits negative for acute  abnormality. Images visualized by this provider.   Patient will require ophthalmology follow-up for reported vision changes despite normal visual acuity and CT orbits today.  If symptoms recur may require ophthalmology consult while in the ED. Regarding her reported GERD, tremulousness, and anxiety that woke her from her sleep, pending chest pain work-up.  If normal may consider discharge with outpatient follow-up.  Care of this patient signed out to oncoming ED provider, Domenic Moras, PA-C at time of shift change. All pertinent HPI, physical exam, and CXR were discussed with him prior to my departure. Dispo pending completion of workup.   This chart was dictated using voice recognition software, Dragon. Despite the best efforts of this provider to proofread and correct errors, errors may still occur which can change documentation meaning.  Final Clinical Impression(s) / ED Diagnoses Final diagnoses:  None    Rx / DC Orders ED Discharge Orders     None         Aura Dials 12/04/21 6962    Palumbo, April, MD 12/06/21 2326

## 2021-12-04 NOTE — ED Triage Notes (Signed)
Was seen here on 12/03/21 due to pain behind lt eyes and seeing white squiggly lines out the rt eye. Provider wanted to do a ct scan however pt insulin pump was running low and she decided to leave. Was advised by provider for her to see her PCP and eye doctor today. Per pt she is now having shaking all over her body, and dry mouth, and an episode of diarrhea. No longer has the issues with the vision denies any pain anywhere

## 2021-12-04 NOTE — ED Notes (Signed)
Visual acuity completed with glasses rt 20/46, lt 20/25, both 20/25 without glasses lt 20/25, rt 20/32, both 20/32. Provider made aware of same

## 2021-12-04 NOTE — ED Notes (Signed)
DC instructions reviewed with pt. Pt verbalized understanding.  PT DC.  

## 2021-12-04 NOTE — ED Provider Notes (Signed)
Received signout from previous provider, please see her note for complete H&P.  This is a 71 year old female significant history of diabetes, anxiety, thyroid disease, hypertension, hyperlipidemia presenting with complaints of chest discomfort.  She endorsed burning sensation in her chest feeling tremulous dry mouth and have 1 episodes of watery diarrhea last night.  Symptoms felt similar to her heartburn that she has in the past.  She also endorsed feeling a bit anxious.  States she was active to the with swallowing earlier this morning.  Patient was seen the day before due to complaints of headache and change in vision but left prior to the completion of her work-up.  Labs and imaging along with EKG was obtained and independently reviewed interpreted by me and I agree with radiologist rotation.  Labs are reassuring, no significant electrolyte derangement, normal initial troponin currently awaits to follow-up with Dr. Troponin.  CT scan of the head and orbits was obtained without any acute abnormalities.  An x-ray of the chest shows no active disease.  EKG independently viewed interpreted by me sinus rhythm with a heart rate of 66, with a right bundle branch block similar to prior and no concerning ischemic changes or concerning arrhythmia.  Patient plan to go to the eye specialist today for further assessment.  Patient is then to return probably if symptoms worsen.  She does have history of fissures hemorrhage in the past and does follow-up with ophthalmologist Dr. Herbert Deaner and Dr. Coralyn Pear.  BP (!) 155/59   Pulse 70   Temp 98.7 F (37.1 C) (Oral)   Resp 20   Ht '5\' 6"'$  (1.676 m)   Wt 64.9 kg   SpO2 98%   BMI 23.08 kg/m   Results for orders placed or performed during the hospital encounter of 12/04/21  SARS Coronavirus 2 by RT PCR (hospital order, performed in [place] hospital lab) *cepheid single result test* Anterior Nasal Swab   Specimen: Anterior Nasal Swab  Result Value Ref Range   SARS  Coronavirus 2 by RT PCR NEGATIVE NEGATIVE  CBC with Differential  Result Value Ref Range   WBC 6.1 4.0 - 10.5 K/uL   RBC 4.94 3.87 - 5.11 MIL/uL   Hemoglobin 15.0 12.0 - 15.0 g/dL   HCT 44.2 36.0 - 46.0 %   MCV 89.5 80.0 - 100.0 fL   MCH 30.4 26.0 - 34.0 pg   MCHC 33.9 30.0 - 36.0 g/dL   RDW 13.2 11.5 - 15.5 %   Platelets 195 150 - 400 K/uL   nRBC 0.0 0.0 - 0.2 %   Neutrophils Relative % 62 %   Neutro Abs 3.8 1.7 - 7.7 K/uL   Lymphocytes Relative 26 %   Lymphs Abs 1.6 0.7 - 4.0 K/uL   Monocytes Relative 9 %   Monocytes Absolute 0.5 0.1 - 1.0 K/uL   Eosinophils Relative 2 %   Eosinophils Absolute 0.1 0.0 - 0.5 K/uL   Basophils Relative 1 %   Basophils Absolute 0.1 0.0 - 0.1 K/uL   Immature Granulocytes 0 %   Abs Immature Granulocytes 0.02 0.00 - 0.07 K/uL  Basic metabolic panel  Result Value Ref Range   Sodium 142 135 - 145 mmol/L   Potassium 4.3 3.5 - 5.1 mmol/L   Chloride 107 98 - 111 mmol/L   CO2 28 22 - 32 mmol/L   Glucose, Bld 139 (H) 70 - 99 mg/dL   BUN 18 8 - 23 mg/dL   Creatinine, Ser 0.79 0.44 - 1.00 mg/dL  Calcium 9.7 8.9 - 10.3 mg/dL   GFR, Estimated >60 >60 mL/min   Anion gap 7 5 - 15  POC CBG, ED  Result Value Ref Range   Glucose-Capillary 114 (H) 70 - 99 mg/dL  Troponin I (High Sensitivity)  Result Value Ref Range   Troponin I (High Sensitivity) 5 <18 ng/L  Troponin I (High Sensitivity)  Result Value Ref Range   Troponin I (High Sensitivity) 5 <18 ng/L   CT HEAD WO CONTRAST (5MM)  Result Date: 12/04/2021 CLINICAL DATA:  Floaters with transient blurry vision EXAM: CT HEAD AND ORBITS WITHOUT CONTRAST TECHNIQUE: Contiguous axial images were obtained from the base of the skull through the vertex without contrast. Multidetector CT imaging of the orbits was performed using the standard protocol without intravenous contrast. RADIATION DOSE REDUCTION: This exam was performed according to the departmental dose-optimization program which includes automated  exposure control, adjustment of the mA and/or kV according to patient size and/or use of iterative reconstruction technique. COMPARISON:  None Available. FINDINGS: CT HEAD FINDINGS Brain: No evidence of acute infarction, hemorrhage, hydrocephalus, extra-axial collection or mass lesion/mass effect. Partially empty sella, incidental in isolation. Vascular: No hyperdense vessel or unexpected calcification. Mild vertebrobasilar tortuosity/elongation. Skull: Normal. Negative for fracture or focal lesion. CT ORBITS FINDINGS Orbits: Bilateral cataract resection. Unremarkable appearance of the orbits, optic nerve sheath complexes, extraocular muscles, orbital fat, and superior ophthalmic veins. Visualized sinuses: Clear Soft tissues: Negative IMPRESSION: No acute finding or explanation for symptoms. Electronically Signed   By: Jorje Guild M.D.   On: 12/04/2021 06:24   DG Chest Portable 1 View  Result Date: 12/04/2021 CLINICAL DATA:  Chest pain EXAM: PORTABLE CHEST 1 VIEW COMPARISON:  11/06/2017 FINDINGS: 0529 hours. The lungs are clear without focal pneumonia, edema, pneumothorax or pleural effusion. Chronic interstitial coarsening noted at the bases. The cardiopericardial silhouette is within normal limits for size. The visualized bony structures of the thorax are unremarkable. Telemetry leads overlie the chest. IMPRESSION: No active disease. Hyperexpansion with chronic interstitial coarsening. Electronically Signed   By: Misty Stanley M.D.   On: 12/04/2021 05:49   MM 3D SCREEN BREAST BILATERAL  Result Date: 11/16/2021 CLINICAL DATA:  Screening. EXAM: DIGITAL SCREENING BILATERAL MAMMOGRAM WITH TOMOSYNTHESIS AND CAD TECHNIQUE: Bilateral screening digital craniocaudal and mediolateral oblique mammograms were obtained. Bilateral screening digital breast tomosynthesis was performed. The images were evaluated with computer-aided detection. COMPARISON:  Previous exam(s). ACR Breast Density Category c: The breast  tissue is heterogeneously dense, which may obscure small masses. FINDINGS: There are no findings suspicious for malignancy. IMPRESSION: No mammographic evidence of malignancy. A result letter of this screening mammogram will be mailed directly to the patient. RECOMMENDATION: Screening mammogram in one year. (Code:SM-B-01Y) BI-RADS CATEGORY  1: Negative. Electronically Signed   By: Franki Cabot M.D.   On: 11/16/2021 08:07   CT Orbits Wo Contrast  Result Date: 12/04/2021 CLINICAL DATA:  Floaters with transient blurry vision EXAM: CT HEAD AND ORBITS WITHOUT CONTRAST TECHNIQUE: Contiguous axial images were obtained from the base of the skull through the vertex without contrast. Multidetector CT imaging of the orbits was performed using the standard protocol without intravenous contrast. RADIATION DOSE REDUCTION: This exam was performed according to the departmental dose-optimization program which includes automated exposure control, adjustment of the mA and/or kV according to patient size and/or use of iterative reconstruction technique. COMPARISON:  None Available. FINDINGS: CT HEAD FINDINGS Brain: No evidence of acute infarction, hemorrhage, hydrocephalus, extra-axial collection or mass lesion/mass effect. Partially empty sella,  incidental in isolation. Vascular: No hyperdense vessel or unexpected calcification. Mild vertebrobasilar tortuosity/elongation. Skull: Normal. Negative for fracture or focal lesion. CT ORBITS FINDINGS Orbits: Bilateral cataract resection. Unremarkable appearance of the orbits, optic nerve sheath complexes, extraocular muscles, orbital fat, and superior ophthalmic veins. Visualized sinuses: Clear Soft tissues: Negative IMPRESSION: No acute finding or explanation for symptoms. Electronically Signed   By: Jorje Guild M.D.   On: 12/04/2021 06:24       Domenic Moras, PA-C 12/04/21 0957    Horton, Alvin Critchley, DO 12/04/21 1012

## 2021-12-30 DIAGNOSIS — E785 Hyperlipidemia, unspecified: Secondary | ICD-10-CM | POA: Diagnosis not present

## 2021-12-30 DIAGNOSIS — H4312 Vitreous hemorrhage, left eye: Secondary | ICD-10-CM | POA: Diagnosis not present

## 2021-12-30 DIAGNOSIS — I10 Essential (primary) hypertension: Secondary | ICD-10-CM | POA: Diagnosis not present

## 2021-12-30 DIAGNOSIS — K219 Gastro-esophageal reflux disease without esophagitis: Secondary | ICD-10-CM | POA: Diagnosis not present

## 2021-12-30 DIAGNOSIS — E039 Hypothyroidism, unspecified: Secondary | ICD-10-CM | POA: Diagnosis not present

## 2021-12-30 DIAGNOSIS — K760 Fatty (change of) liver, not elsewhere classified: Secondary | ICD-10-CM | POA: Diagnosis not present

## 2021-12-30 DIAGNOSIS — E10319 Type 1 diabetes mellitus with unspecified diabetic retinopathy without macular edema: Secondary | ICD-10-CM | POA: Diagnosis not present

## 2021-12-30 DIAGNOSIS — H3581 Retinal edema: Secondary | ICD-10-CM | POA: Diagnosis not present

## 2021-12-30 DIAGNOSIS — E559 Vitamin D deficiency, unspecified: Secondary | ICD-10-CM | POA: Diagnosis not present

## 2021-12-30 DIAGNOSIS — E042 Nontoxic multinodular goiter: Secondary | ICD-10-CM | POA: Diagnosis not present

## 2022-01-14 DIAGNOSIS — G43B1 Ophthalmoplegic migraine, intractable: Secondary | ICD-10-CM | POA: Diagnosis not present

## 2022-01-14 DIAGNOSIS — H40013 Open angle with borderline findings, low risk, bilateral: Secondary | ICD-10-CM | POA: Diagnosis not present

## 2022-01-14 DIAGNOSIS — H35373 Puckering of macula, bilateral: Secondary | ICD-10-CM | POA: Diagnosis not present

## 2022-01-14 DIAGNOSIS — E103293 Type 1 diabetes mellitus with mild nonproliferative diabetic retinopathy without macular edema, bilateral: Secondary | ICD-10-CM | POA: Diagnosis not present

## 2022-03-31 ENCOUNTER — Encounter (INDEPENDENT_AMBULATORY_CARE_PROVIDER_SITE_OTHER): Payer: Medicare Other | Admitting: Ophthalmology

## 2022-04-01 NOTE — Progress Notes (Addendum)
[facility] Note  04/07/2022     CHIEF COMPLAINT Patient presents for Retina Follow Up    HISTORY OF PRESENT ILLNESS: Jamie Mays is a 71 y.o. female who presents to the clinic today for:   HPI     Retina Follow Up   Patient presents with  Other.  In left eye.  This started years ago.  Severity is moderate.  Duration of 6 months.  Since onset it is stable.  I, the attending physician,  performed the HPI with the patient and updated documentation appropriately.        Comments   Patient feels that the vision is the same. She is complaining of allergy eye - itchy, blurry, and watering. Her blood sugar was 158 and her A1c is 6.2.      Last edited by Bernarda Caffey, MD on 04/07/2022 10:56 PM.     Pt states she went to the ED in May due to a headache behind the left eye and an aura in the right eye, they dx her with floaters, pt states she still has "faint" floaters that she sees more when it is cloudy  Referring physician: Reynold Bowen, MD [place],  [place] [id number]  HISTORICAL INFORMATION:  Selected notes from the MEDICAL RECORD NUMBER Referred by Dr. Herbert Deaner for acute [facility] OS LEE:  Ocular Hx- PMH-    CURRENT MEDICATIONS: Current Outpatient Medications (Ophthalmic Drugs)  Medication Sig   XIIDRA 5 % SOLN Apply 1 drop to eye 2 (two) times daily.   No current facility-administered medications for this visit. (Ophthalmic Drugs)   Current Outpatient Medications (Other)  Medication Sig   calcium carbonate (TUMS - DOSED IN MG ELEMENTAL CALCIUM) 500 MG chewable tablet Chew 2 tablets by mouth daily as needed for indigestion or heartburn.   Famotidine (ZANTAC 360 PO) Take 360 mg by mouth daily as needed (heartburn).   glucose blood test strip 1 each by Other route 6 (six) times daily. Use as instructed    insulin aspart (NOVOLOG) 100 UNIT/ML injection Inject 75-100 Units into the skin daily. Via insulin pump as directed    irbesartan (AVAPRO) 150 MG tablet Take 150 mg by mouth daily.   levothyroxine (SYNTHROID, LEVOTHROID) 75 MCG tablet Take 1 tablet (75 mcg total) by mouth daily. (Patient taking differently: Take 75 mcg by mouth See admin instructions. Take 75 mg on Mon, Tue, Thurs, Fri, and Sat.  Skip Sun and Wed dose)   Magnesium 200 MG TABS Take 400 mg by mouth daily.   Omega-3 Fatty Acids (FISH OIL ADULT GUMMIES PO) Take 2 capsules by mouth daily.   propranolol (INDERAL) 20 MG tablet Take 0.5-1 tablets (10-20 mg total) by mouth 3 (three) times daily as needed (palpitations).   Turmeric 500 MG CAPS Take 500 mg by mouth daily.   Vitamin D, Ergocalciferol, (DRISDOL) 1.25 MG (50000 UT) CAPS capsule Take 50,000 Units by mouth every Tuesday.    No current facility-administered medications for this visit. (Other)   REVIEW OF SYSTEMS: ROS   Positive for: Musculoskeletal, Endocrine, Eyes Negative for: Constitutional, Gastrointestinal, Neurological, Skin, Genitourinary, HENT, Cardiovascular, Respiratory, Psychiatric, Allergic/Imm, Heme/Lymph Last edited by Annie Paras, COT on 04/07/2022  8:30 AM.     ALLERGIES Allergies  Allergen Reactions   Doxycycline Other (See Comments)    Joint pain   Lisinopril Cough   Minocycline Other (See Comments)    Dizziness    Oxycodone  Hallucinations    Prednisone Other (See Comments)    "Nausea and weakness"   PAST MEDICAL HISTORY Past Medical History:  Diagnosis Date   Ankle fracture    left   Arthritis    Diabetes mellitus    Diabetic retinopathy ([place])    Family history of adverse reaction to anesthesia    " they had difficulty waking my dad up; he was about 76-16 years old;" neice had anaphylactic reaction after anesthesia   Family history of MI (myocardial infarction)    GERD (gastroesophageal reflux disease)    Hypertension    Hypertensive retinopathy    Hypothyroidism    Painful orthopaedic hardware ([place])    PVC (premature ventricular  contraction)    PVC's (premature ventricular contractions)    Smoker    former  " quit about 40 years ago"   Syncope    Vitreous hemorrhage of left eye ([place])    Wears glasses    Past Surgical History:  Procedure Laterality Date   CARDIAC CATHETERIZATION  2007   neg   CATARACT EXTRACTION     CATARACT EXTRACTION W/ INTRAOCULAR LENS  IMPLANT, BILATERAL     CESAREAN SECTION     DUPUYTREN CONTRACTURE RELEASE Left 07/20/2021   Procedure: DUPUYTREN CONTRACTURE RELEASE, left small finger;  Surgeon: Orene Desanctis, MD;  Location: [facility];  Service: Orthopedics;  Laterality: Left;   EYE SURGERY     HARDWARE REMOVAL Left 03/19/2020   Procedure: HARDWARE REMOVAL LEFT ANKLE;  Surgeon: Shona Needles, MD;  Location: [facility];  Service: Orthopedics;  Laterality: Left;   ORIF ANKLE FRACTURE Left 07/19/2019   Procedure: OPEN REDUCTION INTERNAL FIXATION (ORIF) ANKLE FRACTURE;  Surgeon: Shona Needles, MD;  Location: [facility];  Service: Orthopedics;  Laterality: Left;   FAMILY HISTORY Family History  Problem Relation Age of Onset   Coronary artery disease Mother    Kidney disease Mother    Diabetes Mother    Heart disease Mother        heart attack   Hypertension Mother    Macular degeneration Father    Diabetes Sister    Diabetes Maternal Grandmother    Diabetes Brother    Cancer Brother        lung cancer   Cancer Brother 69       lungs, brain, chest wall, liver cancer, stage 4   Cancer Other 28       lung , brain   Breast cancer Niece     SOCIAL HISTORY Social History   Tobacco Use   Smoking status: Former    Types: Cigarettes    Quit date: 07/12/1981    Years since quitting: 40.7   Smokeless tobacco: Never  Vaping Use   Vaping Use: Never used  Substance Use Topics   Alcohol use: Not Currently    Alcohol/week: 0.0 standard drinks of alcohol    Comment: very rare   Drug use: No       OPHTHALMIC EXAM:  Base Eye Exam     Visual Acuity (Snellen - Linear)        Right Left   Dist cc 20/40 20/30   Dist ph cc 20/25 NI    Correction: Glasses         Tonometry (Tonopen, 8:35 AM)       Right Left   Pressure 7 12         Pupils       Dark Light Shape React APD  Right 4 3 Round Brisk None   Left 4 3 Round Brisk None         Visual Fields       Left Right    Full Full         Extraocular Movement       Right Left    Full, Ortho Full, Ortho         Neuro/Psych     Oriented x3: Yes   Mood/Affect: Normal         Dilation     Both eyes: 1.0% Mydriacyl, 2.5% Phenylephrine @ 8:31 AM           Slit Lamp and Fundus Exam     Slit Lamp Exam       Right Left   Lids/Lashes Dermatochalasis - upper lid Dermatochalasis - upper lid   Conjunctiva/Sclera White and quiet temporal pinguecula   Cornea mild arcus, EBMD, trace tear film debris Mild arcus, EBMD, mild tear film debris, well healed cataract wound   Anterior Chamber deep, clear, narrow temporal angle Deep and clear   Iris Round and dilated Round and dilated, No NVI   Lens PC IOL in good position PC IOL in good position   Anterior Vitreous Vitreous syneresis, Posterior vitreous detachment Vitreous syneresis, PVD, old white, VH settled inferiorly, vitreous condensations         Fundus Exam       Right Left   Disc Pink and Sharp Mild, temporal pallor, sharp rim, PPP   C/D Ratio 0.3 0.2   Macula Flat, Good foveal reflex, RPE mottling, rare MA, No edema Flat, blunted foveal reflex, mild RPE mottling, scattered, punctate MA, No edema   Vessels attenuated, mild tortuosity attenuated, mild tortuosity, mild AV crossing changes   Periphery Attached, scattered MA, few DBH inferiorly attached, rare, scattered MA/DBH, no RT/RD           Refraction     Wearing Rx       Sphere Cylinder Axis Add   Right -0.50 +0.50 153 +2.50   Left -0.25 +1.50 160 +2.50    Type: PAL            IMAGING AND PROCEDURES  Imaging and Procedures for 04/07/2022  OCT,  Retina - OU - Both Eyes       Right Eye Quality was good. Central Foveal Thickness: 272. Progression has been stable. Findings include normal foveal contour, no IRF, no SRF (Trace ERM).   Left Eye Quality was borderline. Central Foveal Thickness: 284. Progression has improved. Findings include normal foveal contour, no IRF, no SRF (Trace ERM, trace vitreous opacities, trace cystic changes SN mac).   Notes *Images captured and stored on drive  Diagnosis / Impression:  OD: Trace ERM OS: Trace ERM, trace vitreous opacities, trace cystic changes SN mac  Clinical management:  See below  Abbreviations: NFP - Normal foveal profile. CME - cystoid macular edema. PED - pigment epithelial detachment. IRF - intraretinal fluid. SRF - subretinal fluid. EZ - ellipsoid zone. ERM - epiretinal membrane. ORA - outer retinal atrophy. ORT - outer retinal tubulation. SRHM - subretinal hyper-reflective material. IRHM - intraretinal hyper-reflective material             ASSESSMENT/PLAN:    ICD-10-CM   1. Vitreous hemorrhage of left eye (HCC)  H43.12 OCT, Retina - OU - Both Eyes    2. Moderate nonproliferative diabetic retinopathy of both eyes without macular edema associated with type 2 diabetes mellitus ([place])  J67.3419     3. Essential hypertension  I10     4. Hypertensive retinopathy of both eyes  H35.033     5. Pseudophakia, both eyes  Z96.1       1. Vitreous Hemorrhage OS -- stably improved  - s/p IVA OS #1 (06.22.22), #2 (07.27.22), #3 (09.14.22)  - onset overnight and noted upon waking (06.22.22)  - unclear etiology -- hemorrhagic PVD v diabetic hemorrhage  - no RT/RD on 360 peripheral examination  - b-scan 6.29.22 without RT/RD or mass  - VH settled inferiorly and white  - BCVA 20/30 OS  - FA 8.24.22 shows interval improvement in blockage/ VH compared to 06.22.22 FA; no NV OU - f/u 6-9 months -- DFE/OCT  2. Moderate nonproliferative diabetic retinopathy w/o DME, both eyes  - OS:  dense VH, resolved - FA 06.22.22 w/ scattered leaking MA -- no obvious NV, but OS with significant blockage from [facility] - Repeat FA 08.24.22 OS Interval improvement in blockage/ VH compared to 06.22.22 FA; no NV OU - OCT without diabetic macular edema, both eyes   - monitor  3,4. Hypertensive retinopathy OU - discussed importance of tight BP control - monitor  5. Pseudophakia OU  - s/p CE/IOL OU  - IOLs in good position, doing well  - monitor  Ophthalmic Meds Ordered this visit:  No orders of the defined types were placed in this encounter.    Return for f/u 6-9 months, VH OS / NPDR OU, DFE, OCT.  There are no Patient Instructions on file for this visit.  This document serves as a record of services personally performed by Gardiner Sleeper, MD, PhD. It was created on their behalf by Roselee Nova, COMT. The creation of this record is the provider's dictation and/or activities during the visit.  Electronically signed by: Roselee Nova, COMT 04/07/22 11:01 PM  This document serves as a record of services personally performed by Gardiner Sleeper, MD, PhD. It was created on their behalf by San Jetty. Owens Shark, OA an ophthalmic technician. The creation of this record is the provider's dictation and/or activities during the visit.    Electronically signed by: San Jetty. Owens Shark, [place] 09.27.2023 11:01 PM  Gardiner Sleeper, M.D., Ph.D. Diseases & Surgery of the Retina and Vitreous [facility]  I have reviewed the above documentation for accuracy and completeness, and I agree with the above. Gardiner Sleeper, M.D., Ph.D. 04/07/22 11:01 PM  Abbreviations: M myopia (nearsighted); A astigmatism; H hyperopia (farsighted); P presbyopia; Mrx spectacle prescription;  CTL contact lenses; OD right eye; OS left eye; OU both eyes  XT exotropia; ET esotropia; PEK punctate epithelial keratitis; PEE punctate epithelial erosions; DES dry eye syndrome; MGD meibomian gland dysfunction; ATs artificial  tears; PFAT's preservative free artificial tears; [place] nuclear sclerotic cataract; PSC posterior subcapsular cataract; ERM epi-retinal membrane; PVD posterior vitreous detachment; RD retinal detachment; DM diabetes mellitus; DR diabetic retinopathy; NPDR non-proliferative diabetic retinopathy; PDR proliferative diabetic retinopathy; CSME clinically significant macular edema; DME diabetic macular edema; dbh dot blot hemorrhages; CWS cotton wool spot; POAG primary open angle glaucoma; C/D cup-to-disc ratio; HVF humphrey visual field; GVF goldmann visual field; OCT optical coherence tomography; IOP intraocular pressure; BRVO Branch retinal vein occlusion; CRVO central retinal vein occlusion; CRAO central retinal artery occlusion; BRAO branch retinal artery occlusion; RT retinal tear; SB scleral buckle; PPV pars plana vitrectomy; VH Vitreous hemorrhage; PRP panretinal laser photocoagulation; IVK intravitreal kenalog; VMT vitreomacular traction; MH Macular hole;  NVD  neovascularization of the disc; NVE neovascularization elsewhere; AREDS age related eye disease study; ARMD age related macular degeneration; POAG primary open angle glaucoma; EBMD epithelial/anterior basement membrane dystrophy; ACIOL anterior chamber intraocular lens; IOL intraocular lens; PCIOL posterior chamber intraocular lens; Phaco/IOL phacoemulsification with intraocular lens placement; [place] photorefractive keratectomy; LASIK laser assisted in situ keratomileusis; HTN hypertension; DM diabetes mellitus; COPD chronic obstructive pulmonary disease

## 2022-04-07 ENCOUNTER — Encounter (INDEPENDENT_AMBULATORY_CARE_PROVIDER_SITE_OTHER): Payer: Self-pay | Admitting: Ophthalmology

## 2022-04-07 ENCOUNTER — Ambulatory Visit (INDEPENDENT_AMBULATORY_CARE_PROVIDER_SITE_OTHER): Payer: Medicare Other | Admitting: Ophthalmology

## 2022-04-07 DIAGNOSIS — H4312 Vitreous hemorrhage, left eye: Secondary | ICD-10-CM

## 2022-04-07 DIAGNOSIS — I10 Essential (primary) hypertension: Secondary | ICD-10-CM

## 2022-04-07 DIAGNOSIS — E113393 Type 2 diabetes mellitus with moderate nonproliferative diabetic retinopathy without macular edema, bilateral: Secondary | ICD-10-CM

## 2022-04-07 DIAGNOSIS — Z961 Presence of intraocular lens: Secondary | ICD-10-CM

## 2022-04-07 DIAGNOSIS — H35033 Hypertensive retinopathy, bilateral: Secondary | ICD-10-CM

## 2022-04-07 DIAGNOSIS — H3581 Retinal edema: Secondary | ICD-10-CM

## 2022-04-12 DIAGNOSIS — L718 Other rosacea: Secondary | ICD-10-CM | POA: Diagnosis not present

## 2022-04-19 DIAGNOSIS — H3581 Retinal edema: Secondary | ICD-10-CM | POA: Diagnosis not present

## 2022-04-19 DIAGNOSIS — E785 Hyperlipidemia, unspecified: Secondary | ICD-10-CM | POA: Diagnosis not present

## 2022-04-19 DIAGNOSIS — E039 Hypothyroidism, unspecified: Secondary | ICD-10-CM | POA: Diagnosis not present

## 2022-04-19 DIAGNOSIS — E10319 Type 1 diabetes mellitus with unspecified diabetic retinopathy without macular edema: Secondary | ICD-10-CM | POA: Diagnosis not present

## 2022-04-19 DIAGNOSIS — K760 Fatty (change of) liver, not elsewhere classified: Secondary | ICD-10-CM | POA: Diagnosis not present

## 2022-04-19 DIAGNOSIS — R0989 Other specified symptoms and signs involving the circulatory and respiratory systems: Secondary | ICD-10-CM | POA: Diagnosis not present

## 2022-04-19 DIAGNOSIS — E042 Nontoxic multinodular goiter: Secondary | ICD-10-CM | POA: Diagnosis not present

## 2022-04-19 DIAGNOSIS — E559 Vitamin D deficiency, unspecified: Secondary | ICD-10-CM | POA: Diagnosis not present

## 2022-04-19 DIAGNOSIS — I10 Essential (primary) hypertension: Secondary | ICD-10-CM | POA: Diagnosis not present

## 2022-04-19 DIAGNOSIS — Z23 Encounter for immunization: Secondary | ICD-10-CM | POA: Diagnosis not present

## 2022-04-19 DIAGNOSIS — H4312 Vitreous hemorrhage, left eye: Secondary | ICD-10-CM | POA: Diagnosis not present

## 2022-05-12 ENCOUNTER — Other Ambulatory Visit: Payer: Self-pay

## 2022-05-12 DIAGNOSIS — I739 Peripheral vascular disease, unspecified: Secondary | ICD-10-CM

## 2022-05-14 ENCOUNTER — Ambulatory Visit (HOSPITAL_COMMUNITY)
Admission: RE | Admit: 2022-05-14 | Discharge: 2022-05-14 | Disposition: A | Discharge status: Home / Self Care | Payer: Medicare Other | Source: Ambulatory Visit | Attending: Vascular Surgery | Admitting: Vascular Surgery

## 2022-05-14 DIAGNOSIS — I739 Peripheral vascular disease, unspecified: Secondary | ICD-10-CM | POA: Insufficient documentation

## 2022-05-27 DIAGNOSIS — E103293 Type 1 diabetes mellitus with mild nonproliferative diabetic retinopathy without macular edema, bilateral: Secondary | ICD-10-CM | POA: Diagnosis not present

## 2022-05-27 DIAGNOSIS — H35373 Puckering of macula, bilateral: Secondary | ICD-10-CM | POA: Diagnosis not present

## 2022-05-27 DIAGNOSIS — H40013 Open angle with borderline findings, low risk, bilateral: Secondary | ICD-10-CM | POA: Diagnosis not present

## 2022-05-27 DIAGNOSIS — H04123 Dry eye syndrome of bilateral lacrimal glands: Secondary | ICD-10-CM | POA: Diagnosis not present

## 2022-07-12 ENCOUNTER — Other Ambulatory Visit: Payer: Self-pay | Admitting: Cardiovascular Disease

## 2022-07-21 DIAGNOSIS — E785 Hyperlipidemia, unspecified: Secondary | ICD-10-CM | POA: Diagnosis not present

## 2022-07-21 DIAGNOSIS — E10319 Type 1 diabetes mellitus with unspecified diabetic retinopathy without macular edema: Secondary | ICD-10-CM | POA: Diagnosis not present

## 2022-07-21 DIAGNOSIS — H4312 Vitreous hemorrhage, left eye: Secondary | ICD-10-CM | POA: Diagnosis not present

## 2022-07-21 DIAGNOSIS — K760 Fatty (change of) liver, not elsewhere classified: Secondary | ICD-10-CM | POA: Diagnosis not present

## 2022-07-21 DIAGNOSIS — E042 Nontoxic multinodular goiter: Secondary | ICD-10-CM | POA: Diagnosis not present

## 2022-07-21 DIAGNOSIS — E559 Vitamin D deficiency, unspecified: Secondary | ICD-10-CM | POA: Diagnosis not present

## 2022-07-21 DIAGNOSIS — E039 Hypothyroidism, unspecified: Secondary | ICD-10-CM | POA: Diagnosis not present

## 2022-07-21 DIAGNOSIS — I10 Essential (primary) hypertension: Secondary | ICD-10-CM | POA: Diagnosis not present

## 2022-07-21 DIAGNOSIS — H3581 Retinal edema: Secondary | ICD-10-CM | POA: Diagnosis not present

## 2022-07-21 DIAGNOSIS — I493 Ventricular premature depolarization: Secondary | ICD-10-CM | POA: Diagnosis not present

## 2022-07-21 DIAGNOSIS — K219 Gastro-esophageal reflux disease without esophagitis: Secondary | ICD-10-CM | POA: Diagnosis not present

## 2022-07-26 NOTE — Progress Notes (Signed)
[facility] Note  07/27/2022     CHIEF COMPLAINT Patient presents for Retina Follow Up    HISTORY OF PRESENT ILLNESS: Jamie Mays is a 72 y.o. female who presents to the clinic today for:   HPI     Retina Follow Up   Patient presents with  Other.  In left eye.  This started years ago.  Severity is moderate.  Duration of 4 months.  Since onset it is stable.  I, the attending physician,  performed the HPI with the patient and updated documentation appropriately.        Comments   Patient states that she is seeing a bunch of floater in the left eye. The floaters started Friday 1/12.24 afternoon. She is not seeing flashes of light or a vail/shadow. She is using Restasis OU BID. Her blood sugar was 124.      Last edited by Bernarda Caffey, MD on 07/27/2022  5:29 PM.    Patient is complaining of seeing a ton of floaters in the left eye that started 4 days ago. She is also complaining that the vision in the right eye is getting worse.   Referring physician: Reynold Bowen, MD [place],  [place] [id number]  HISTORICAL INFORMATION:  Selected notes from the MEDICAL RECORD NUMBER Referred by Dr. Herbert Deaner for acute [facility] OS LEE:  Ocular Hx- PMH-    CURRENT MEDICATIONS: Current Outpatient Medications (Ophthalmic Drugs)  Medication Sig   XIIDRA 5 % SOLN Apply 1 drop to eye 2 (two) times daily.   No current facility-administered medications for this visit. (Ophthalmic Drugs)   Current Outpatient Medications (Other)  Medication Sig   calcium carbonate (TUMS - DOSED IN MG ELEMENTAL CALCIUM) 500 MG chewable tablet Chew 2 tablets by mouth daily as needed for indigestion or heartburn.   Famotidine (ZANTAC 360 PO) Take 360 mg by mouth daily as needed (heartburn).   glucose blood test strip 1 each by Other route 6 (six) times daily. Use as instructed    insulin aspart (NOVOLOG) 100 UNIT/ML injection Inject 75-100 Units into the skin daily. Via  insulin pump as directed   irbesartan (AVAPRO) 150 MG tablet Take 150 mg by mouth daily.   levothyroxine (SYNTHROID, LEVOTHROID) 75 MCG tablet Take 1 tablet (75 mcg total) by mouth daily. (Patient taking differently: Take 75 mcg by mouth See admin instructions. Take 75 mg on Mon, Tue, Thurs, Fri, and Sat.  Skip Sun and Wed dose)   Magnesium 200 MG TABS Take 400 mg by mouth daily.   Omega-3 Fatty Acids (FISH OIL ADULT GUMMIES PO) Take 2 capsules by mouth daily.   propranolol (INDERAL) 20 MG tablet TAKE 0.5-1 TABLETS (10-20 MG TOTAL) BY MOUTH 3 (THREE) TIMES DAILY AS NEEDED (PALPITATIONS).   Turmeric 500 MG CAPS Take 500 mg by mouth daily.   Vitamin D, Ergocalciferol, (DRISDOL) 1.25 MG (50000 UT) CAPS capsule Take 50,000 Units by mouth every Tuesday.    No current facility-administered medications for this visit. (Other)   REVIEW OF SYSTEMS: ROS   Positive for: Musculoskeletal, Endocrine, Eyes Negative for: Constitutional, Gastrointestinal, Neurological, Skin, Genitourinary, HENT, Cardiovascular, Respiratory, Psychiatric, Allergic/Imm, Heme/Lymph Last edited by Annie Paras, COT on 07/27/2022  8:59 AM.      ALLERGIES Allergies  Allergen Reactions   Doxycycline Other (See Comments)    Joint pain   Lisinopril Cough   Minocycline Other (See Comments)    Dizziness    Oxycodone  Hallucinations    Prednisone Other (See Comments)    "Nausea and weakness"   PAST MEDICAL HISTORY Past Medical History:  Diagnosis Date   Ankle fracture    left   Arthritis    Diabetes mellitus    Diabetic retinopathy ([place])    Family history of adverse reaction to anesthesia    " they had difficulty waking my dad up; he was about 53-69 years old;" neice had anaphylactic reaction after anesthesia   Family history of MI (myocardial infarction)    GERD (gastroesophageal reflux disease)    Hypertension    Hypertensive retinopathy    Hypothyroidism    Painful orthopaedic hardware ([place])    PVC  (premature ventricular contraction)    PVC's (premature ventricular contractions)    Smoker    former  " quit about 40 years ago"   Syncope    Vitreous hemorrhage of left eye ([place])    Wears glasses    Past Surgical History:  Procedure Laterality Date   CARDIAC CATHETERIZATION  2007   neg   CATARACT EXTRACTION     CATARACT EXTRACTION W/ INTRAOCULAR LENS  IMPLANT, BILATERAL     CESAREAN SECTION     DUPUYTREN CONTRACTURE RELEASE Left 07/20/2021   Procedure: DUPUYTREN CONTRACTURE RELEASE, left small finger;  Surgeon: Orene Desanctis, MD;  Location: [place];  Service: Orthopedics;  Laterality: Left;   EYE SURGERY     HARDWARE REMOVAL Left 03/19/2020   Procedure: HARDWARE REMOVAL LEFT ANKLE;  Surgeon: Shona Needles, MD;  Location: [place];  Service: Orthopedics;  Laterality: Left;   ORIF ANKLE FRACTURE Left 07/19/2019   Procedure: OPEN REDUCTION INTERNAL FIXATION (ORIF) ANKLE FRACTURE;  Surgeon: Shona Needles, MD;  Location: [place];  Service: Orthopedics;  Laterality: Left;   FAMILY HISTORY Family History  Problem Relation Age of Onset   Coronary artery disease Mother    Kidney disease Mother    Diabetes Mother    Heart disease Mother        heart attack   Hypertension Mother    Macular degeneration Father    Diabetes Sister    Diabetes Maternal Grandmother    Diabetes Brother    Cancer Brother        lung cancer   Cancer Brother 41       lungs, brain, chest wall, liver cancer, stage 4   Cancer Other 38       lung , brain   Breast cancer Niece     SOCIAL HISTORY Social History   Tobacco Use   Smoking status: Former    Types: Cigarettes    Quit date: 07/12/1981    Years since quitting: 41.0   Smokeless tobacco: Never  Vaping Use   Vaping Use: Never used  Substance Use Topics   Alcohol use: Not Currently    Alcohol/week: 0.0 standard drinks of alcohol    Comment: very rare   Drug use: No       OPHTHALMIC EXAM:  Base Eye Exam     Visual Acuity  (Snellen - Linear)       Right Left   Dist cc 20/100 +1 20/20 +1   Dist ph cc 20/30 +2     Correction: Glasses         Tonometry (Tonopen, 9:08 AM)       Right Left   Pressure 17 15         Pupils       Dark Light  Shape React APD   Right 4 3 Round Brisk None   Left 4 3 Round Brisk None         Visual Fields       Left Right    Full Full         Extraocular Movement       Right Left    Full, Ortho Full, Ortho         Neuro/Psych     Oriented x3: Yes   Mood/Affect: Normal         Dilation     Both eyes: 1.0% Mydriacyl, 2.5% Phenylephrine @ 9:01 AM           Slit Lamp and Fundus Exam     Slit Lamp Exam       Right Left   Lids/Lashes Dermatochalasis - upper lid, Meibomian gland dysfunction Dermatochalasis - upper lid, Meibomian gland dysfunction   Conjunctiva/Sclera White and quiet temporal pinguecula   Cornea mild arcus, 2-3+ EBMD, trace tear film debris, 1+PEE, central corneal haze Mild arcus, EBMD, mild tear film debris, well healed cataract wound   Anterior Chamber deep, clear, narrow temporal angle Deep and clear   Iris Round and dilated Round and dilated, No NVI   Lens PC IOL in good position PC IOL in good position   Anterior Vitreous Vitreous syneresis, Posterior vitreous detachment Vitreous syneresis, PVD, +RBC, old white VH settled inferiorly         Fundus Exam       Right Left   Disc Pink and Sharp Mild, temporal pallor, sharp rim, PPP   C/D Ratio 0.3 0.2   Macula Flat, Good foveal reflex, RPE mottling, rare MA, No edema Flat, good foveal reflex, mild RPE mottling, scattered MA/ DBH, No edema   Vessels attenuated, mild tortuosity attenuated, mild tortuosity, mild AV crossing changes   Periphery Attached, scattered MA/360 DBH attached, scattered MA/DBH 360, no RT/RD           Refraction     Wearing Rx       Sphere Cylinder Axis Add   Right -0.50 +0.50 153 +2.50   Left -0.25 +1.50 160 +2.50    Type: PAL          Manifest Refraction       Sphere Cylinder Axis Dist VA Add   Right -1.75 +0.25 153 20/40 +2.50   Left -0.25 +1.50 160 20/20 +2.50            IMAGING AND PROCEDURES  Imaging and Procedures for 07/27/2022  OCT, Retina - OU - Both Eyes       Right Eye Quality was good. Central Foveal Thickness: 277. Progression has been stable. Findings include normal foveal contour, no IRF, no SRF (Trace ERM).   Left Eye Quality was borderline. Central Foveal Thickness: 286. Progression has worsened. Findings include normal foveal contour, no SRF (Trace ERM, + vitreous opacities-- increased from prior, trace cystic changes SN mac).   Notes *Images captured and stored on drive  Diagnosis / Impression:  OD: Trace ERM OS: Trace ERM, + vitreous opacities-- increased from prior, trace cystic changes SN mac  Clinical management:  See below  Abbreviations: NFP - Normal foveal profile. CME - cystoid macular edema. PED - pigment epithelial detachment. IRF - intraretinal fluid. SRF - subretinal fluid. EZ - ellipsoid zone. ERM - epiretinal membrane. ORA - outer retinal atrophy. ORT - outer retinal tubulation. SRHM - subretinal hyper-reflective material. IRHM - intraretinal hyper-reflective material  Intravitreal Injection, Pharmacologic Agent - OS - Left Eye       Time Out 07/27/2022. 10:02 AM. Confirmed correct patient, procedure, site, and patient consented.   Anesthesia Topical anesthesia was used. Anesthetic medications included Lidocaine 2%, Proparacaine 0.5%.   Procedure Preparation included 5% betadine to ocular surface, eyelid speculum. A (32g) needle was used.   Injection: 1.25 mg Bevacizumab 1.19m/0.05ml   Route: Intravitreal, Site: Left Eye   NDC:: 81771-165-79 Lot:: [id number] Expiration date: 08/10/2022   Post-op Post injection exam found visual acuity of at least counting fingers. The patient tolerated the procedure well. There were no complications. The patient received  written and verbal post procedure care education.      Fluorescein Angiography Optos (Transit OS)       Right Eye Progression has been stable. Early phase findings include microaneurysm, vascular perfusion defect. Mid/Late phase findings include leakage, microaneurysm (Mild leakage from scattered MA's and mild late perivascular leakage temporal periphery, no NV).   Left Eye Progression has worsened. Early phase findings include blockage, leakage, microaneurysm, retinal neovascularization, vascular perfusion defect (Focal NV inf nasal peripheral midzone). Mid/Late phase findings include blockage, leakage, microaneurysm, retinal neovascularization, vascular perfusion defect (Focal NV inf nasal peripheral midzone).   Notes **Images stored on drive**  Impression: Moderate NPDR OD Proliferative diabetic retinopathy OS -- focal NV inf nasal peripheral midzone Late leaking MA's OU               ASSESSMENT/PLAN:    ICD-10-CM   1. Vitreous hemorrhage of left eye (HCC)  H43.12 Intravitreal Injection, Pharmacologic Agent - OS - Left Eye    Bevacizumab (AVASTIN) SOLN 1.25 mg    2. Proliferative diabetic retinopathy of left eye without macular edema associated with type 2 diabetes mellitus (HCC)  E11.3592 OCT, Retina - OU - Both Eyes    Intravitreal Injection, Pharmacologic Agent - OS - Left Eye    Bevacizumab (AVASTIN) SOLN 1.25 mg    3. Moderate nonproliferative diabetic retinopathy of right eye without macular edema associated with type 2 diabetes mellitus (HCC)  E11.3391 OCT, Retina - OU - Both Eyes    4. Essential hypertension  I10     5. Hypertensive retinopathy of both eyes  H35.033 Fluorescein Angiography Optos (Transit OS)    6. Pseudophakia, both eyes  Z96.1     7. Dry eye syndrome of both eyes  H04.123      1,2. Vitreous Hemorrhage OS -- recurrent onset ~07/23/22 -- PDR OS  - s/p IVA OS #1 (06.22.22), #2 (07.27.22), #3 (09.14.22)  - original onset of VH overnight and  noted upon waking (06.22.22)  - b-scan 6.29.22 without RT/RD or mass  - exam shows old VH settled inferiorly and white in color, but new blood stained vitreous condensations centrally - OCT shows OS; Trace ERM, + vitreous opacities-- increased from prior, trace cystic changes SN mac  - BCVA 20/20 OS - FA 8.24.22 shows interval improvement in blockage/ VH compared to 06.22.22 FA; no NV OU - repeat FA today 01.16.24 shows new focal NV inf nasal peripheral midzone -- likely etiology of VH - recommend IVA OS #4 today 01.16.24 and PRP OS ASAP - risk and benefits were discussed, patient wishes to proceed - consent was obtained and signed 01.16.24 - f/u Thursday 01.18.24 for PRP OS - f/u 4 weeks-- DFE/OCT, possible injection   3. Moderate nonproliferative diabetic retinopathy w/o DME OD - FA 06.22.22, 08.24.22 and today 01.16.24-- no NV OD, just leaking MA and mild  peripheral perivascular leakage - OCT without diabetic macular edema, both eyes   - continue to monitor  4,5. Hypertensive retinopathy OU - discussed importance of tight BP control - continue to monitor  6. Pseudophakia OU  - s/p CE/IOL OU  - IOL's in good position, doing well  - continue to monitor  7. Dry eyes OU - recommend artificial tears and lubricating ointment as needed  - dryness is changing glasses rx - recommend Systane Gel OU QHS - continue Restasis OU BID as instructed by Dr. Mare Ferrari   Ophthalmic Meds Ordered this visit:  Meds ordered this encounter  Medications   Bevacizumab (AVASTIN) SOLN 1.25 mg     Return in about 4 weeks (around 08/24/2022) for f/u VH OS , DFE, OCT, Possible, IVA, OS.  There are no Patient Instructions on file for this visit.  This document serves as a record of services personally performed by Gardiner Sleeper, MD, PhD. It was created on their behalf by San Jetty. Owens Shark, OA an ophthalmic technician. The creation of this record is the provider's dictation and/or activities during the  visit.    Electronically signed by: San Jetty. Owens Shark, [place] 01.15.2024 5:29 PM  This document serves as a record of services personally performed by Gardiner Sleeper, MD, PhD. It was created on their behalf by Renaldo Reel, [place] an ophthalmic technician. The creation of this record is the provider's dictation and/or activities during the visit.    Electronically signed by:  Renaldo Reel, COT  01.16.24 5:29 PM  Gardiner Sleeper, M.D., Ph.D. Diseases & Surgery of the Retina and Vitreous [facility]  I have reviewed the above documentation for accuracy and completeness, and I agree with the above. Gardiner Sleeper, M.D., Ph.D. 07/27/22 5:34 PM   Abbreviations: M myopia (nearsighted); A astigmatism; H hyperopia (farsighted); P presbyopia; Mrx spectacle prescription;  CTL contact lenses; OD right eye; OS left eye; OU both eyes  XT exotropia; ET esotropia; PEK punctate epithelial keratitis; PEE punctate epithelial erosions; DES dry eye syndrome; MGD meibomian gland dysfunction; ATs artificial tears; PFAT's preservative free artificial tears; [place] nuclear sclerotic cataract; PSC posterior subcapsular cataract; ERM epi-retinal membrane; PVD posterior vitreous detachment; RD retinal detachment; DM diabetes mellitus; DR diabetic retinopathy; NPDR non-proliferative diabetic retinopathy; PDR proliferative diabetic retinopathy; CSME clinically significant macular edema; DME diabetic macular edema; dbh dot blot hemorrhages; CWS cotton wool spot; POAG primary open angle glaucoma; C/D cup-to-disc ratio; HVF humphrey visual field; GVF goldmann visual field; OCT optical coherence tomography; IOP intraocular pressure; BRVO Branch retinal vein occlusion; CRVO central retinal vein occlusion; CRAO central retinal artery occlusion; BRAO branch retinal artery occlusion; RT retinal tear; SB scleral buckle; PPV pars plana vitrectomy; VH Vitreous hemorrhage; PRP panretinal laser photocoagulation; IVK  intravitreal kenalog; VMT vitreomacular traction; MH Macular hole;  NVD neovascularization of the disc; NVE neovascularization elsewhere; AREDS age related eye disease study; ARMD age related macular degeneration; POAG primary open angle glaucoma; EBMD epithelial/anterior basement membrane dystrophy; ACIOL anterior chamber intraocular lens; IOL intraocular lens; PCIOL posterior chamber intraocular lens; Phaco/IOL phacoemulsification with intraocular lens placement; [place] photorefractive keratectomy; LASIK laser assisted in situ keratomileusis; HTN hypertension; DM diabetes mellitus; COPD chronic obstructive pulmonary disease

## 2022-07-27 ENCOUNTER — Encounter (INDEPENDENT_AMBULATORY_CARE_PROVIDER_SITE_OTHER): Payer: Self-pay | Admitting: Ophthalmology

## 2022-07-27 ENCOUNTER — Ambulatory Visit (INDEPENDENT_AMBULATORY_CARE_PROVIDER_SITE_OTHER): Payer: Medicare Other | Admitting: Ophthalmology

## 2022-07-27 DIAGNOSIS — I10 Essential (primary) hypertension: Secondary | ICD-10-CM | POA: Diagnosis not present

## 2022-07-27 DIAGNOSIS — H35033 Hypertensive retinopathy, bilateral: Secondary | ICD-10-CM | POA: Diagnosis not present

## 2022-07-27 DIAGNOSIS — H04123 Dry eye syndrome of bilateral lacrimal glands: Secondary | ICD-10-CM

## 2022-07-27 DIAGNOSIS — E113391 Type 2 diabetes mellitus with moderate nonproliferative diabetic retinopathy without macular edema, right eye: Secondary | ICD-10-CM

## 2022-07-27 DIAGNOSIS — E113592 Type 2 diabetes mellitus with proliferative diabetic retinopathy without macular edema, left eye: Secondary | ICD-10-CM

## 2022-07-27 DIAGNOSIS — E113393 Type 2 diabetes mellitus with moderate nonproliferative diabetic retinopathy without macular edema, bilateral: Secondary | ICD-10-CM

## 2022-07-27 DIAGNOSIS — H4312 Vitreous hemorrhage, left eye: Secondary | ICD-10-CM

## 2022-07-27 DIAGNOSIS — Z961 Presence of intraocular lens: Secondary | ICD-10-CM

## 2022-07-27 MED ORDER — BEVACIZUMAB CHEMO INJECTION 1.25MG/0.05ML SYRINGE FOR KALEIDOSCOPE
1.2500 mg | INTRAVITREAL | Status: AC | PRN
  Administered 2022-07-27: 1.25 mg via INTRAVITREAL

## 2022-07-28 NOTE — Progress Notes (Signed)
[facility] Note  07/29/2022     CHIEF COMPLAINT Patient presents for Retina Follow Up  HISTORY OF PRESENT ILLNESS: Jamie Mays is a 72 y.o. female who presents to the clinic today for:   HPI     Retina Follow Up   Patient presents with  Other.  In left eye.  Severity is moderate.  Duration of 2 days.  Since onset it is stable.  I, the attending physician,  performed the HPI with the patient and updated documentation appropriately.        Comments   Pt here for 2 day ret f/u, PRP OS-VH OS. Pt states VA the same, still seeing floaters OS.       Last edited by Bernarda Caffey, MD on 07/29/2022 11:15 AM.    Pt here for PRP OS  Referring physician: Reynold Bowen, MD [place],  [place] [address]  HISTORICAL INFORMATION:  Selected notes from the MEDICAL RECORD NUMBER Referred by Dr. Herbert Deaner for acute [facility] OS LEE:  Ocular Hx- PMH-    CURRENT MEDICATIONS: Current Outpatient Medications (Ophthalmic Drugs)  Medication Sig   Loteprednol Etabonate (LOTEMAX SM) 0.38 % GEL Place 1 drop into the left eye 4 (four) times daily.   prednisoLONE acetate (PRED FORTE) 1 % ophthalmic suspension Place 1 drop into the left eye 4 (four) times daily for 7 days.   XIIDRA 5 % SOLN Apply 1 drop to eye 2 (two) times daily.   No current facility-administered medications for this visit. (Ophthalmic Drugs)   Current Outpatient Medications (Other)  Medication Sig   calcium carbonate (TUMS - DOSED IN MG ELEMENTAL CALCIUM) 500 MG chewable tablet Chew 2 tablets by mouth daily as needed for indigestion or heartburn.   Famotidine (ZANTAC 360 PO) Take 360 mg by mouth daily as needed (heartburn).   glucose blood test strip 1 each by Other route 6 (six) times daily. Use as instructed    irbesartan (AVAPRO) 150 MG tablet Take 150 mg by mouth daily.   levothyroxine (SYNTHROID, LEVOTHROID) 75 MCG tablet Take 1 tablet (75 mcg total) by mouth daily. (Patient taking  differently: Take 75 mcg by mouth See admin instructions. Take 75 mg on Mon, Tue, Thurs, Fri, and Sat.  Skip Sun and Wed dose)   Magnesium 200 MG TABS Take 400 mg by mouth daily.   Omega-3 Fatty Acids (FISH OIL ADULT GUMMIES PO) Take 2 capsules by mouth daily.   propranolol (INDERAL) 20 MG tablet TAKE 0.5-1 TABLETS (10-20 MG TOTAL) BY MOUTH 3 (THREE) TIMES DAILY AS NEEDED (PALPITATIONS).   Turmeric 500 MG CAPS Take 500 mg by mouth daily.   Vitamin D, Ergocalciferol, (DRISDOL) 1.25 MG (50000 UT) CAPS capsule Take 50,000 Units by mouth every Tuesday.    insulin aspart (NOVOLOG) 100 UNIT/ML injection Inject 75-100 Units into the skin daily. Via insulin pump as directed   No current facility-administered medications for this visit. (Other)   REVIEW OF SYSTEMS: ROS   Positive for: Musculoskeletal, Endocrine, Eyes Negative for: Constitutional, Gastrointestinal, Neurological, Skin, Genitourinary, HENT, Cardiovascular, Respiratory, Psychiatric, Allergic/Imm, Heme/Lymph Last edited by Kingsley Spittle, COT on 07/29/2022  8:01 AM.     ALLERGIES Allergies  Allergen Reactions   Doxycycline Other (See Comments)    Joint pain   Lisinopril Cough   Minocycline Other (See Comments)    Dizziness    Oxycodone     Hallucinations    Prednisone Other (See Comments)    "Nausea  and weakness"   PAST MEDICAL HISTORY Past Medical History:  Diagnosis Date   Ankle fracture    left   Arthritis    Diabetes mellitus    Diabetic retinopathy ([place])    Family history of adverse reaction to anesthesia    " they had difficulty waking my dad up; he was about 41-48 years old;" neice had anaphylactic reaction after anesthesia   Family history of MI (myocardial infarction)    GERD (gastroesophageal reflux disease)    Hypertension    Hypertensive retinopathy    Hypothyroidism    Painful orthopaedic hardware ([place])    PVC (premature ventricular contraction)    PVC's (premature ventricular contractions)     Smoker    former  " quit about 40 years ago"   Syncope    Vitreous hemorrhage of left eye ([place])    Wears glasses    Past Surgical History:  Procedure Laterality Date   CARDIAC CATHETERIZATION  2007   neg   CATARACT EXTRACTION     CATARACT EXTRACTION W/ INTRAOCULAR LENS  IMPLANT, BILATERAL     CESAREAN SECTION     DUPUYTREN CONTRACTURE RELEASE Left 07/20/2021   Procedure: DUPUYTREN CONTRACTURE RELEASE, left small finger;  Surgeon: Orene Desanctis, MD;  Location: [facility];  Service: Orthopedics;  Laterality: Left;   EYE SURGERY     HARDWARE REMOVAL Left 03/19/2020   Procedure: HARDWARE REMOVAL LEFT ANKLE;  Surgeon: Shona Needles, MD;  Location: [facility];  Service: Orthopedics;  Laterality: Left;   ORIF ANKLE FRACTURE Left 07/19/2019   Procedure: OPEN REDUCTION INTERNAL FIXATION (ORIF) ANKLE FRACTURE;  Surgeon: Shona Needles, MD;  Location: [facility];  Service: Orthopedics;  Laterality: Left;   FAMILY HISTORY Family History  Problem Relation Age of Onset   Coronary artery disease Mother    Kidney disease Mother    Diabetes Mother    Heart disease Mother        heart attack   Hypertension Mother    Macular degeneration Father    Diabetes Sister    Diabetes Maternal Grandmother    Diabetes Brother    Cancer Brother        lung cancer   Cancer Brother 70       lungs, brain, chest wall, liver cancer, stage 4   Cancer Other 38       lung , brain   Breast cancer Niece    SOCIAL HISTORY Social History   Tobacco Use   Smoking status: Former    Types: Cigarettes    Quit date: 07/12/1981    Years since quitting: 41.0   Smokeless tobacco: Never  Vaping Use   Vaping Use: Never used  Substance Use Topics   Alcohol use: Not Currently    Alcohol/week: 0.0 standard drinks of alcohol    Comment: very rare   Drug use: No       OPHTHALMIC EXAM:  Base Eye Exam     Visual Acuity (Snellen - Linear)       Right Left   Dist cc 20/50 20/25   Dist ph cc 20/30 -1 20/20     Correction: Glasses         Tonometry (Tonopen, 8:06 AM)       Right Left   Pressure 9 10         Pupils       Pupils Dark Light Shape React APD   Right PERRL 4 3 Round Brisk None  Left PERRL 4 3 Round Brisk None         Visual Fields (Counting fingers)       Left Right    Full Full         Extraocular Movement       Right Left    Full, Ortho Full, Ortho         Neuro/Psych     Oriented x3: Yes   Mood/Affect: Normal         Dilation     Left eye: 1.0% Mydriacyl, 2.5% Phenylephrine @ 8:07 AM           Slit Lamp and Fundus Exam     Slit Lamp Exam       Right Left   Lids/Lashes Dermatochalasis - upper lid, Meibomian gland dysfunction Dermatochalasis - upper lid, Meibomian gland dysfunction   Conjunctiva/Sclera White and quiet temporal pinguecula   Cornea mild arcus, 2-3+ EBMD, trace tear film debris, 1+PEE, central corneal haze Mild arcus, EBMD, mild tear film debris, well healed cataract wound   Anterior Chamber deep, clear, narrow temporal angle Deep and clear   Iris Round and dilated Round and dilated, No NVI   Lens PC IOL in good position PC IOL in good position   Anterior Vitreous Vitreous syneresis, Posterior vitreous detachment Vitreous syneresis, PVD, +RBC, old white VH settled inferiorly         Fundus Exam       Right Left   Disc Pink and Sharp Mild, temporal pallor, sharp rim, PPP   C/D Ratio 0.3 0.2   Macula Flat, Good foveal reflex, RPE mottling, rare MA, No edema Flat, good foveal reflex, mild RPE mottling, scattered MA/ DBH, No edema   Vessels attenuated, mild tortuosity attenuated, mild tortuosity, mild AV crossing changes   Periphery Attached, scattered MA/360 DBH attached, scattered MA/DBH 360, no RT/RD           Refraction     Wearing Rx       Sphere Cylinder Axis Add   Right -0.50 +0.50 153 +2.50   Left -0.25 +1.50 160 +2.50    Type: PAL            IMAGING AND PROCEDURES  Imaging and  Procedures for 07/29/2022  OCT, Retina - OU - Both Eyes       Right Eye Quality was good. Central Foveal Thickness: 278. Progression has been stable. Findings include normal foveal contour, no IRF, no SRF (Trace ERM).   Left Eye Quality was borderline. Central Foveal Thickness: 282. Progression has been stable. Findings include normal foveal contour, no SRF (Trace ERM, mild interval improvement in vitreous opacities, trace cystic changes SN mac).   Notes *Images captured and stored on drive  Diagnosis / Impression:  OD: Trace ERM OS: Trace ERM, Trace ERM, mild interval improvement in vitreous opacities, trace cystic changes SN mac  Clinical management:  See below  Abbreviations: NFP - Normal foveal profile. CME - cystoid macular edema. PED - pigment epithelial detachment. IRF - intraretinal fluid. SRF - subretinal fluid. EZ - ellipsoid zone. ERM - epiretinal membrane. ORA - outer retinal atrophy. ORT - outer retinal tubulation. SRHM - subretinal hyper-reflective material. IRHM - intraretinal hyper-reflective material      Panretinal Photocoagulation - OS - Left Eye       LASER PROCEDURE NOTE  Diagnosis:   Proliferative Diabetic Retinopathy, LEFT EYE  Procedure:  Pan-retinal photocoagulation using slit lamp laser, LEFT EYE  Anesthesia:  Topical  Surgeon:  Bernarda Caffey, MD, PhD   Informed consent obtained, operative eye marked, and time out performed prior to initiation of laser.   Lumenis [id number] slit lamp laser Pattern: 3x3 square Power: 340 mW Duration: 30 msec  Spot size: 200 microns  # spots: 969 spots -- greatest nasal hemisphere  Complications: None.  RTC: Feb 13 or later -- DFE/OCT, possible injxn  Patient tolerated the procedure well and received written and verbal post-procedure care information/education.            ASSESSMENT/PLAN:    ICD-10-CM   1. Vitreous hemorrhage of left eye (HCC)  H43.12     2. Proliferative diabetic retinopathy of  left eye without macular edema associated with type 2 diabetes mellitus (HCC)  E11.3592 OCT, Retina - OU - Both Eyes    Panretinal Photocoagulation - OS - Left Eye    3. Moderate nonproliferative diabetic retinopathy of right eye without macular edema associated with type 2 diabetes mellitus ([place])  K48.1856     4. Essential hypertension  I10     5. Hypertensive retinopathy of both eyes  H35.033     6. Pseudophakia, both eyes  Z96.1     7. Dry eye syndrome of both eyes  H04.123      1,2. Vitreous Hemorrhage OS -- recurrent onset ~07/23/22 -- PDR OS  - s/p IVA OS #1 (06.22.22), #2 (07.27.22), #3 (09.14.22), #4 (01.16.24)  - original onset of VH overnight and noted upon waking (06.22.22)  - b-scan 6.29.22 without RT/RD or mass  - exam shows old VH settled inferiorly and white in color, but new blood stained vitreous condensations centrally - OCT shows OS Trace ERM, mild interval improvement in vitreous opacities-- increased from prior, trace cystic changes SN mac  - BCVA 20/20 OS - FA 8.24.22 shows interval improvement in blockage/ VH compared to 06.22.22 FA; no NV OU - repeat FA 01.16.24 shows new focal NV inf nasal peripheral midzone -- likely etiology of VH - recommend PRP OS today, 01.18.24 - patient wishes to proceed with laser  - RBA of procedure discussed, questions answered - informed consent obtained and signed - see procedure note - start LotemaxSM QID OS x7 days - IVA consent was obtained and signed 01.16.24 (OS) - f/u February 13 or later -- DFE/OCT, possible injection   3. Moderate nonproliferative diabetic retinopathy w/o DME OD - FA 06.22.22, 08.24.22 and today 01.16.24-- no NV OD, just leaking MA and mild peripheral perivascular leakage - OCT without diabetic macular edema, both eyes   - continue to monitor  4,5. Hypertensive retinopathy OU - discussed importance of tight BP control - continue to monitor  6. Pseudophakia OU  - s/p CE/IOL OU  - IOL's in good  position, doing well  - continue to monitor  7. Dry eyes OU - recommend artificial tears and lubricating ointment as needed  - dryness is changing glasses rx - recommend Systane Gel OU QHS - continue Restasis OU BID as instructed by Dr. Mare Ferrari   Ophthalmic Meds Ordered this visit:  Meds ordered this encounter  Medications   prednisoLONE acetate (PRED FORTE) 1 % ophthalmic suspension    Sig: Place 1 drop into the left eye 4 (four) times daily for 7 days.    Dispense:  10 mL    Refill:  0   Loteprednol Etabonate (LOTEMAX SM) 0.38 % GEL    Sig: Place 1 drop into the left eye 4 (four) times daily.    Dispense:  5 g  Refill:  0     Return for f/u February 13 or later, VH OS, DFE, OCT, possible injxn.  There are no Patient Instructions on file for this visit.  This document serves as a record of services personally performed by Gardiner Sleeper, MD, PhD. It was created on their behalf by San Jetty. Owens Shark, OA an ophthalmic technician. The creation of this record is the provider's dictation and/or activities during the visit.    Electronically signed by: San Jetty. Owens Shark, [place] 01.17.2024 11:15 AM  Gardiner Sleeper, M.D., Ph.D. Diseases & Surgery of the Retina and Vitreous [facility]  I have reviewed the above documentation for accuracy and completeness, and I agree with the above. Gardiner Sleeper, M.D., Ph.D. 07/29/22 11:20 AM   Abbreviations: M myopia (nearsighted); A astigmatism; H hyperopia (farsighted); P presbyopia; Mrx spectacle prescription;  CTL contact lenses; OD right eye; OS left eye; OU both eyes  XT exotropia; ET esotropia; PEK punctate epithelial keratitis; PEE punctate epithelial erosions; DES dry eye syndrome; MGD meibomian gland dysfunction; ATs artificial tears; PFAT's preservative free artificial tears; [employer] nuclear sclerotic cataract; PSC posterior subcapsular cataract; ERM epi-retinal membrane; PVD posterior vitreous detachment; RD retinal  detachment; DM diabetes mellitus; DR diabetic retinopathy; NPDR non-proliferative diabetic retinopathy; PDR proliferative diabetic retinopathy; CSME clinically significant macular edema; DME diabetic macular edema; dbh dot blot hemorrhages; CWS cotton wool spot; POAG primary open angle glaucoma; C/D cup-to-disc ratio; HVF humphrey visual field; GVF goldmann visual field; OCT optical coherence tomography; IOP intraocular pressure; BRVO Branch retinal vein occlusion; CRVO central retinal vein occlusion; CRAO central retinal artery occlusion; BRAO branch retinal artery occlusion; RT retinal tear; SB scleral buckle; PPV pars plana vitrectomy; VH Vitreous hemorrhage; PRP panretinal laser photocoagulation; IVK intravitreal kenalog; VMT vitreomacular traction; MH Macular hole;  NVD neovascularization of the disc; NVE neovascularization elsewhere; AREDS age related eye disease study; ARMD age related macular degeneration; POAG primary open angle glaucoma; EBMD epithelial/anterior basement membrane dystrophy; ACIOL anterior chamber intraocular lens; IOL intraocular lens; PCIOL posterior chamber intraocular lens; Phaco/IOL phacoemulsification with intraocular lens placement; [place] photorefractive keratectomy; LASIK laser assisted in situ keratomileusis; HTN hypertension; DM diabetes mellitus; COPD chronic obstructive pulmonary disease

## 2022-07-29 ENCOUNTER — Encounter (INDEPENDENT_AMBULATORY_CARE_PROVIDER_SITE_OTHER): Payer: Self-pay | Admitting: Ophthalmology

## 2022-07-29 ENCOUNTER — Ambulatory Visit (INDEPENDENT_AMBULATORY_CARE_PROVIDER_SITE_OTHER): Payer: Medicare Other | Admitting: Ophthalmology

## 2022-07-29 DIAGNOSIS — H35033 Hypertensive retinopathy, bilateral: Secondary | ICD-10-CM

## 2022-07-29 DIAGNOSIS — H4312 Vitreous hemorrhage, left eye: Secondary | ICD-10-CM | POA: Diagnosis not present

## 2022-07-29 DIAGNOSIS — H04123 Dry eye syndrome of bilateral lacrimal glands: Secondary | ICD-10-CM

## 2022-07-29 DIAGNOSIS — E113391 Type 2 diabetes mellitus with moderate nonproliferative diabetic retinopathy without macular edema, right eye: Secondary | ICD-10-CM

## 2022-07-29 DIAGNOSIS — I10 Essential (primary) hypertension: Secondary | ICD-10-CM

## 2022-07-29 DIAGNOSIS — E113592 Type 2 diabetes mellitus with proliferative diabetic retinopathy without macular edema, left eye: Secondary | ICD-10-CM | POA: Diagnosis not present

## 2022-07-29 DIAGNOSIS — Z961 Presence of intraocular lens: Secondary | ICD-10-CM

## 2022-07-29 MED ORDER — PREDNISOLONE ACETATE 1 % OP SUSP: 1.0000 [drp] | Freq: Four times a day (QID) | OPHTHALMIC | 0 refills | Status: AC

## 2022-07-29 MED ORDER — LOTEMAX SM 0.38 % OP GEL: 1.0000 [drp] | Freq: Four times a day (QID) | OPHTHALMIC | 0 refills | Status: AC

## 2022-08-10 NOTE — Progress Notes (Shared)
[facility] Note  08/24/2022     CHIEF COMPLAINT Patient presents for No chief complaint on file.  HISTORY OF PRESENT ILLNESS: Jamie Mays is a 72 y.o. female who presents to the clinic today for:    Pt here for PRP OS  Referring physician: Reynold Bowen, MD [place],  [place] [address]  HISTORICAL INFORMATION:  Selected notes from the MEDICAL RECORD NUMBER Referred by Dr. Herbert Deaner for acute [facility] OS LEE:  Ocular Hx- PMH-    CURRENT MEDICATIONS: Current Outpatient Medications (Ophthalmic Drugs)  Medication Sig   Loteprednol Etabonate (LOTEMAX SM) 0.38 % GEL Place 1 drop into the left eye 4 (four) times daily.   XIIDRA 5 % SOLN Apply 1 drop to eye 2 (two) times daily.   No current facility-administered medications for this visit. (Ophthalmic Drugs)   Current Outpatient Medications (Other)  Medication Sig   calcium carbonate (TUMS - DOSED IN MG ELEMENTAL CALCIUM) 500 MG chewable tablet Chew 2 tablets by mouth daily as needed for indigestion or heartburn.   Famotidine (ZANTAC 360 PO) Take 360 mg by mouth daily as needed (heartburn).   glucose blood test strip 1 each by Other route 6 (six) times daily. Use as instructed    insulin aspart (NOVOLOG) 100 UNIT/ML injection Inject 75-100 Units into the skin daily. Via insulin pump as directed   irbesartan (AVAPRO) 150 MG tablet Take 150 mg by mouth daily.   levothyroxine (SYNTHROID, LEVOTHROID) 75 MCG tablet Take 1 tablet (75 mcg total) by mouth daily. (Patient taking differently: Take 75 mcg by mouth See admin instructions. Take 75 mg on Mon, Tue, Thurs, Fri, and Sat.  Skip Sun and Wed dose)   Magnesium 200 MG TABS Take 400 mg by mouth daily.   Omega-3 Fatty Acids (FISH OIL ADULT GUMMIES PO) Take 2 capsules by mouth daily.   propranolol (INDERAL) 20 MG tablet TAKE 0.5-1 TABLETS (10-20 MG TOTAL) BY MOUTH 3 (THREE) TIMES DAILY AS NEEDED (PALPITATIONS).   Turmeric 500 MG CAPS Take 500 mg by  mouth daily.   Vitamin D, Ergocalciferol, (DRISDOL) 1.25 MG (50000 UT) CAPS capsule Take 50,000 Units by mouth every Tuesday.    No current facility-administered medications for this visit. (Other)   REVIEW OF SYSTEMS:   ALLERGIES Allergies  Allergen Reactions   Doxycycline Other (See Comments)    Joint pain   Lisinopril Cough   Minocycline Other (See Comments)    Dizziness    Oxycodone     Hallucinations    Prednisone Other (See Comments)    "Nausea and weakness"   PAST MEDICAL HISTORY Past Medical History:  Diagnosis Date   Ankle fracture    left   Arthritis    Diabetes mellitus    Diabetic retinopathy ([place])    Family history of adverse reaction to anesthesia    " they had difficulty waking my dad up; he was about 27-66 years old;" neice had anaphylactic reaction after anesthesia   Family history of MI (myocardial infarction)    GERD (gastroesophageal reflux disease)    Hypertension    Hypertensive retinopathy    Hypothyroidism    Painful orthopaedic hardware ([place])    PVC (premature ventricular contraction)    PVC's (premature ventricular contractions)    Smoker    former  " quit about 40 years ago"   Syncope    Vitreous hemorrhage of left eye ([place])    Wears glasses    Past Surgical  History:  Procedure Laterality Date   CARDIAC CATHETERIZATION  2007   neg   CATARACT EXTRACTION     CATARACT EXTRACTION W/ INTRAOCULAR LENS  IMPLANT, BILATERAL     CESAREAN SECTION     DUPUYTREN CONTRACTURE RELEASE Left 07/20/2021   Procedure: DUPUYTREN CONTRACTURE RELEASE, left small finger;  Surgeon: Orene Desanctis, MD;  Location: [facility];  Service: Orthopedics;  Laterality: Left;   EYE SURGERY     HARDWARE REMOVAL Left 03/19/2020   Procedure: HARDWARE REMOVAL LEFT ANKLE;  Surgeon: Shona Needles, MD;  Location: [facility];  Service: Orthopedics;  Laterality: Left;   ORIF ANKLE FRACTURE Left 07/19/2019   Procedure: OPEN REDUCTION INTERNAL FIXATION (ORIF) ANKLE  FRACTURE;  Surgeon: Shona Needles, MD;  Location: [facility];  Service: Orthopedics;  Laterality: Left;   FAMILY HISTORY Family History  Problem Relation Age of Onset   Coronary artery disease Mother    Kidney disease Mother    Diabetes Mother    Heart disease Mother        heart attack   Hypertension Mother    Macular degeneration Father    Diabetes Sister    Diabetes Maternal Grandmother    Diabetes Brother    Cancer Brother        lung cancer   Cancer Brother 67       lungs, brain, chest wall, liver cancer, stage 4   Cancer Other 70       lung , brain   Breast cancer Niece    SOCIAL HISTORY Social History   Tobacco Use   Smoking status: Former    Types: Cigarettes    Quit date: 07/12/1981    Years since quitting: 41.1   Smokeless tobacco: Never  Vaping Use   Vaping Use: Never used  Substance Use Topics   Alcohol use: Not Currently    Alcohol/week: 0.0 standard drinks of alcohol    Comment: very rare   Drug use: No       OPHTHALMIC EXAM:  Not recorded     IMAGING AND PROCEDURES  Imaging and Procedures for 08/24/2022          ASSESSMENT/PLAN:    ICD-10-CM   1. Vitreous hemorrhage of left eye ([place])  H43.12     2. Proliferative diabetic retinopathy of left eye without macular edema associated with type 2 diabetes mellitus ([place])  O37.8588     3. Moderate nonproliferative diabetic retinopathy of right eye without macular edema associated with type 2 diabetes mellitus ([place])  F02.7741     4. Essential hypertension  I10     5. Hypertensive retinopathy of both eyes  H35.033     6. Pseudophakia, both eyes  Z96.1     7. Dry eye syndrome of both eyes  H04.123     8. Moderate nonproliferative diabetic retinopathy of both eyes without macular edema associated with type 2 diabetes mellitus ([place])  O87.8676       1,2. Vitreous Hemorrhage OS -- recurrent onset ~07/23/22 -- PDR OS  - s/p IVA OS #1 (06.22.22), #2 (07.27.22), #3 (09.14.22), #4 (01.16.24)  - s/p  PRP OS 01.18.24  - original onset of VH overnight and noted upon waking (06.22.22)  - b-scan 6.29.22 without RT/RD or mass  - exam shows old VH settled inferiorly and white in color, but new blood stained vitreous condensations centrally - OCT shows OS Trace ERM, mild interval improvement in vitreous opacities-- increased from prior, trace cystic changes SN mac  -  BCVA 20/20 OS - FA 8.24.22 shows interval improvement in blockage/ VH compared to 06.22.22 FA; no NV OU - repeat FA 01.16.24 shows new focal NV inf nasal peripheral midzone -- likely etiology of VH - recommend IVA OS #5 today, 02.13.24 - patient wishes to proceed with laser  - RBA of procedure discussed, questions answered - informed consent obtained and signed - see procedure note - start LotemaxSM QID OS x7 days - IVA consent was obtained and signed 01.16.24 (OS) - f/u February 13 or later -- DFE/OCT, possible injection   3. Moderate nonproliferative diabetic retinopathy w/o DME OD - FA 06.22.22, 08.24.22 and today 01.16.24-- no NV OD, just leaking MA and mild peripheral perivascular leakage - OCT without diabetic macular edema, both eyes   - continue to monitor  4,5. Hypertensive retinopathy OU - discussed importance of tight BP control - continue to monitor  6. Pseudophakia OU  - s/p CE/IOL OU  - IOL's in good position, doing well  - continue to monitor  7. Dry eyes OU - recommend artificial tears and lubricating ointment as needed  - dryness is changing glasses rx - recommend Systane Gel OU QHS - continue Restasis OU BID as instructed by Dr. Mare Ferrari   Ophthalmic Meds Ordered this visit:  No orders of the defined types were placed in this encounter.    No follow-ups on file.  There are no Patient Instructions on file for this visit.  This document serves as a record of services personally performed by Gardiner Sleeper, MD, PhD. It was created on their behalf by Renaldo Reel, [place] an ophthalmic  technician. The creation of this record is the provider's dictation and/or activities during the visit.    Electronically signed by:  Renaldo Reel, COT  01.30.24 7:51 AM   Gardiner Sleeper, M.D., Ph.D. Diseases & Surgery of the Retina and Vitreous [facility]: M myopia (nearsighted); A astigmatism; H hyperopia (farsighted); P presbyopia; Mrx spectacle prescription;  CTL contact lenses; OD right eye; OS left eye; OU both eyes  XT exotropia; ET esotropia; PEK punctate epithelial keratitis; PEE punctate epithelial erosions; DES dry eye syndrome; MGD meibomian gland dysfunction; ATs artificial tears; PFAT's preservative free artificial tears; [place] nuclear sclerotic cataract; PSC posterior subcapsular cataract; ERM epi-retinal membrane; PVD posterior vitreous detachment; RD retinal detachment; DM diabetes mellitus; DR diabetic retinopathy; NPDR non-proliferative diabetic retinopathy; PDR proliferative diabetic retinopathy; CSME clinically significant macular edema; DME diabetic macular edema; dbh dot blot hemorrhages; CWS cotton wool spot; POAG primary open angle glaucoma; C/D cup-to-disc ratio; HVF humphrey visual field; GVF goldmann visual field; OCT optical coherence tomography; IOP intraocular pressure; BRVO Branch retinal vein occlusion; CRVO central retinal vein occlusion; CRAO central retinal artery occlusion; BRAO branch retinal artery occlusion; RT retinal tear; SB scleral buckle; PPV pars plana vitrectomy; VH Vitreous hemorrhage; PRP panretinal laser photocoagulation; IVK intravitreal kenalog; VMT vitreomacular traction; MH Macular hole;  NVD neovascularization of the disc; NVE neovascularization elsewhere; AREDS age related eye disease study; ARMD age related macular degeneration; POAG primary open angle glaucoma; EBMD epithelial/anterior basement membrane dystrophy; ACIOL anterior chamber intraocular lens; IOL intraocular lens; PCIOL posterior chamber  intraocular lens; Phaco/IOL phacoemulsification with intraocular lens placement; [place] photorefractive keratectomy; LASIK laser assisted in situ keratomileusis; HTN hypertension; DM diabetes mellitus; COPD chronic obstructive pulmonary disease

## 2022-08-24 ENCOUNTER — Encounter (INDEPENDENT_AMBULATORY_CARE_PROVIDER_SITE_OTHER): Payer: Medicare Other | Admitting: Ophthalmology

## 2022-08-24 ENCOUNTER — Encounter (INDEPENDENT_AMBULATORY_CARE_PROVIDER_SITE_OTHER): Payer: Self-pay

## 2022-08-24 DIAGNOSIS — H4312 Vitreous hemorrhage, left eye: Secondary | ICD-10-CM

## 2022-08-24 DIAGNOSIS — I10 Essential (primary) hypertension: Secondary | ICD-10-CM

## 2022-08-24 DIAGNOSIS — Z961 Presence of intraocular lens: Secondary | ICD-10-CM

## 2022-08-24 DIAGNOSIS — H04123 Dry eye syndrome of bilateral lacrimal glands: Secondary | ICD-10-CM

## 2022-08-24 DIAGNOSIS — H35033 Hypertensive retinopathy, bilateral: Secondary | ICD-10-CM

## 2022-08-24 DIAGNOSIS — E113592 Type 2 diabetes mellitus with proliferative diabetic retinopathy without macular edema, left eye: Secondary | ICD-10-CM

## 2022-08-24 DIAGNOSIS — E113391 Type 2 diabetes mellitus with moderate nonproliferative diabetic retinopathy without macular edema, right eye: Secondary | ICD-10-CM

## 2022-08-24 DIAGNOSIS — E113393 Type 2 diabetes mellitus with moderate nonproliferative diabetic retinopathy without macular edema, bilateral: Secondary | ICD-10-CM

## 2022-09-01 NOTE — Progress Notes (Shared)
[facility] Note  09/02/2022     CHIEF COMPLAINT Patient presents for Retina Follow Up  HISTORY OF PRESENT ILLNESS: Jamie Mays is a 72 y.o. female who presents to the clinic today for:   HPI     Retina Follow Up   This started 5 weeks ago.  Duration of 5 weeks.  Since onset it is stable.  I, the attending physician,  performed the HPI with the patient and updated documentation appropriately.        Comments   Retina follow up VH OS and IVA OS pt states vision has been stable since her last visit she has noticed flashes every now and then pt last blood sugar 159 now       Last edited by Bernarda Caffey, MD on 09/02/2022  9:04 AM.     Pt   Referring physician: Reynold Bowen, MD [place],  [place] [id number]  HISTORICAL INFORMATION:  Selected notes from the MEDICAL RECORD NUMBER Referred by Dr. Herbert Deaner for acute [facility] OS LEE:  Ocular Hx- PMH-    CURRENT MEDICATIONS: Current Outpatient Medications (Ophthalmic Drugs)  Medication Sig   Loteprednol Etabonate (LOTEMAX SM) 0.38 % GEL Place 1 drop into the left eye 4 (four) times daily.   XIIDRA 5 % SOLN Apply 1 drop to eye 2 (two) times daily.   No current facility-administered medications for this visit. (Ophthalmic Drugs)   Current Outpatient Medications (Other)  Medication Sig   calcium carbonate (TUMS - DOSED IN MG ELEMENTAL CALCIUM) 500 MG chewable tablet Chew 2 tablets by mouth daily as needed for indigestion or heartburn.   Famotidine (ZANTAC 360 PO) Take 360 mg by mouth daily as needed (heartburn).   glucose blood test strip 1 each by Other route 6 (six) times daily. Use as instructed    insulin aspart (NOVOLOG) 100 UNIT/ML injection Inject 75-100 Units into the skin daily. Via insulin pump as directed   irbesartan (AVAPRO) 150 MG tablet Take 150 mg by mouth daily.   levothyroxine (SYNTHROID, LEVOTHROID) 75 MCG tablet Take 1 tablet (75 mcg total) by mouth daily. (Patient  taking differently: Take 75 mcg by mouth See admin instructions. Take 75 mg on Mon, Tue, Thurs, Fri, and Sat.  Skip Sun and Wed dose)   Magnesium 200 MG TABS Take 400 mg by mouth daily.   Omega-3 Fatty Acids (FISH OIL ADULT GUMMIES PO) Take 2 capsules by mouth daily.   propranolol (INDERAL) 20 MG tablet TAKE 0.5-1 TABLETS (10-20 MG TOTAL) BY MOUTH 3 (THREE) TIMES DAILY AS NEEDED (PALPITATIONS).   Turmeric 500 MG CAPS Take 500 mg by mouth daily.   Vitamin D, Ergocalciferol, (DRISDOL) 1.25 MG (50000 UT) CAPS capsule Take 50,000 Units by mouth every Tuesday.    No current facility-administered medications for this visit. (Other)   REVIEW OF SYSTEMS: ROS   Positive for: Musculoskeletal, Endocrine, Eyes Negative for: Constitutional, Gastrointestinal, Neurological, Skin, Genitourinary, HENT, Cardiovascular, Respiratory, Psychiatric, Allergic/Imm, Heme/Lymph Last edited by Parthenia Ames, COT on 09/02/2022  8:30 AM.      ALLERGIES Allergies  Allergen Reactions   Doxycycline Other (See Comments)    Joint pain   Lisinopril Cough   Minocycline Other (See Comments)    Dizziness    Oxycodone     Hallucinations    Prednisone Other (See Comments)    "Nausea and weakness"   PAST MEDICAL HISTORY Past Medical History:  Diagnosis Date   Ankle fracture  left   Arthritis    Diabetes mellitus    Diabetic retinopathy ([place])    Family history of adverse reaction to anesthesia    " they had difficulty waking my dad up; he was about 12-58 years old;" neice had anaphylactic reaction after anesthesia   Family history of MI (myocardial infarction)    GERD (gastroesophageal reflux disease)    Hypertension    Hypertensive retinopathy    Hypothyroidism    Painful orthopaedic hardware ([place])    PVC (premature ventricular contraction)    PVC's (premature ventricular contractions)    Smoker    former  " quit about 40 years ago"   Syncope    Vitreous hemorrhage of left eye ([place])    Wears  glasses    Past Surgical History:  Procedure Laterality Date   CARDIAC CATHETERIZATION  2007   neg   CATARACT EXTRACTION     CATARACT EXTRACTION W/ INTRAOCULAR LENS  IMPLANT, BILATERAL     CESAREAN SECTION     DUPUYTREN CONTRACTURE RELEASE Left 07/20/2021   Procedure: DUPUYTREN CONTRACTURE RELEASE, left small finger;  Surgeon: Orene Desanctis, MD;  Location: [place];  Service: Orthopedics;  Laterality: Left;   EYE SURGERY     HARDWARE REMOVAL Left 03/19/2020   Procedure: HARDWARE REMOVAL LEFT ANKLE;  Surgeon: Shona Needles, MD;  Location: [place];  Service: Orthopedics;  Laterality: Left;   ORIF ANKLE FRACTURE Left 07/19/2019   Procedure: OPEN REDUCTION INTERNAL FIXATION (ORIF) ANKLE FRACTURE;  Surgeon: Shona Needles, MD;  Location: [place];  Service: Orthopedics;  Laterality: Left;   FAMILY HISTORY Family History  Problem Relation Age of Onset   Coronary artery disease Mother    Kidney disease Mother    Diabetes Mother    Heart disease Mother        heart attack   Hypertension Mother    Macular degeneration Father    Diabetes Sister    Diabetes Maternal Grandmother    Diabetes Brother    Cancer Brother        lung cancer   Cancer Brother 28       lungs, brain, chest wall, liver cancer, stage 4   Cancer Other 4       lung , brain   Breast cancer Niece    SOCIAL HISTORY Social History   Tobacco Use   Smoking status: Former    Types: Cigarettes    Quit date: 07/12/1981    Years since quitting: 41.1   Smokeless tobacco: Never  Vaping Use   Vaping Use: Never used  Substance Use Topics   Alcohol use: Not Currently    Alcohol/week: 0.0 standard drinks of alcohol    Comment: very rare   Drug use: No       OPHTHALMIC EXAM:  Base Eye Exam     Visual Acuity (Snellen - Linear)       Right Left   Dist cc 20/50 -1 20/25 -2   Dist ph cc 20/30 -2 20/20         Tonometry (Tonopen, 8:34 AM)       Right Left   Pressure 20 17         Pupils        Pupils Dark Light Shape React APD   Right PERRL 4 3 Round Brisk None   Left PERRL 4 3 Round Brisk None         Visual Fields  Left Right    Full Full         Extraocular Movement       Right Left    Full, Ortho Full, Ortho         Neuro/Psych     Oriented x3: Yes   Mood/Affect: Normal         Dilation     Both eyes:            Slit Lamp and Fundus Exam     Slit Lamp Exam       Right Left   Lids/Lashes Dermatochalasis - upper lid, Meibomian gland dysfunction Dermatochalasis - upper lid, Meibomian gland dysfunction   Conjunctiva/Sclera White and quiet temporal pinguecula   Cornea mild arcus, 2-3+ EBMD, trace tear film debris, 1+PEE, central corneal haze Mild arcus, EBMD, mild tear film debris, well healed cataract wound   Anterior Chamber deep, clear, narrow temporal angle Deep and clear   Iris Round and dilated Round and dilated, No NVI   Lens PC IOL in good position PC IOL in good position   Anterior Vitreous Vitreous syneresis, Posterior vitreous detachment Vitreous syneresis, PVD, blood stained vitreous condensations clearing and settling inferiorly         Fundus Exam       Right Left   Disc mild Pallor, Sharp rim Mild, temporal pallor, sharp rim, PPP   C/D Ratio 0.1 0.2   Macula Flat, Good foveal reflex, RPE mottling, rare MA, No edema Flat, good foveal reflex, mild RPE mottling, scattered MA / DBH, No edema   Vessels attenuated, mild tortuosity attenuated, Tortuous   Periphery Attached, scattered MA/360 DBH attached, scattered MA/DBH 360, no RT/RD, 360 PRP with room for fill in           Refraction     Wearing Rx       Sphere Cylinder Axis Add   Right -0.50 +0.50 153 +2.50   Left -0.25 +1.50 160 +2.50    Type: PAL            IMAGING AND PROCEDURES  Imaging and Procedures for 09/02/2022  OCT, Retina - OU - Both Eyes       Right Eye Quality was good. Central Foveal Thickness: 274. Progression has been stable.  Findings include normal foveal contour, no IRF, no SRF (Trace ERM).   Left Eye Quality was borderline. Central Foveal Thickness: 287. Progression has improved. Findings include normal foveal contour, no IRF, no SRF (Trace ERM, mild interval improvement in vitreous opacities, trace cystic changes SN mac -- improved).   Notes *Images captured and stored on drive  Diagnosis / Impression:  OD: Trace ERM OS: Trace ERM, interval improvement in vitreous opacities, trace cystic changes SN mac -- improved  Clinical management:  See below  Abbreviations: NFP - Normal foveal profile. CME - cystoid macular edema. PED - pigment epithelial detachment. IRF - intraretinal fluid. SRF - subretinal fluid. EZ - ellipsoid zone. ERM - epiretinal membrane. ORA - outer retinal atrophy. ORT - outer retinal tubulation. SRHM - subretinal hyper-reflective material. IRHM - intraretinal hyper-reflective material      Intravitreal Injection, Pharmacologic Agent - OS - Left Eye       Time Out 09/02/2022. 8:48 AM. Confirmed correct patient, procedure, site, and patient consented.   Anesthesia Topical anesthesia was used. Anesthetic medications included Lidocaine 2%, Proparacaine 0.5%.   Procedure Preparation included 5% betadine to ocular surface, eyelid speculum. A (32g) needle was used.   Injection: 1.25 mg Bevacizumab  1.52m/0.05ml   Route: Intravitreal, Site: Left Eye   NDC: [id number] Expiration date: 10/24/2022   Post-op Post injection exam found visual acuity of at least counting fingers. The patient tolerated the procedure well. There were no complications. The patient received written and verbal post procedure care education.            ASSESSMENT/PLAN:    ICD-10-CM   1. Vitreous hemorrhage of left eye (HCC)  H43.12     2. Proliferative diabetic retinopathy of left eye without macular edema associated with type 2 diabetes mellitus (HCC)  E11.3592 OCT, Retina - OU - Both Eyes     Intravitreal Injection, Pharmacologic Agent - OS - Left Eye    Bevacizumab (AVASTIN) SOLN 1.25 mg    3. Moderate nonproliferative diabetic retinopathy of right eye without macular edema associated with type 2 diabetes mellitus ([place]  [id number]    4. Essential hypertension  I10     5. Hypertensive retinopathy of both eyes  H35.033     6. Pseudophakia, both eyes  Z96.1     7. Dry eye syndrome of both eyes  H04.123      1,2. Vitreous Hemorrhage OS -- recurrent onset ~07/23/22 -- PDR OS  - original onset of VH overnight and noted upon waking (06.22.22)  - recurrence on 01.12.243  - s/p IVA OS #1 (06.22.22), #2 (07.27.22), #3 (09.14.22), #4 (01.16.24)  - s/p PRP OS (01.18.24)  - b-scan 6.29.22 without RT/RD or mass  - exam shows interval improvement in VH - OCT shows OS Trace ERM, mild interval improvement in vitreous opacities, trace cystic changes SN mac -- improved  - BCVA 20/20 OS - FA (8.24.22) shows interval improvement in blockage/ VH compared to 06.22.22 FA; no NV OU - repeat FA (01.16.24) shows new focal NV inf nasal peripheral midzone -- likely etiology of VH - recommend IVA OS #5 today, 02.21.24 - patient wishes to proceed with injection - RBA of procedure discussed, questions answered - informed consent obtained and signed - see procedure note - IVA consent was obtained and signed 01.16.24 (OS) - f/u 6 weeks -- DFE/OCT, possible injection   3. Moderate nonproliferative diabetic retinopathy w/o DME OD - FA 06.22.22, 08.24.22 and today 01.16.24-- no NV OD, just leaking MA and mild peripheral perivascular leakage - OCT without diabetic macular edema, both eyes   - continue to monitor  4,5. Hypertensive retinopathy OU - discussed importance of tight BP control - continue to monitor  6. Pseudophakia OU  - s/p CE/IOL OU  - IOL's in good position, doing well  - continue to monitor  7. Dry eyes OU - recommend artificial tears and lubricating ointment as needed  -  dryness is changing glasses rx - recommend Systane Gel OU QHS - continue Restasis OU BID as instructed by Dr. FMare Ferrari  Ophthalmic Meds Ordered this visit:  Meds ordered this encounter  Medications   Bevacizumab (AVASTIN) SOLN 1.25 mg     Return in about 6 weeks (around 10/14/2022) for f/u VH / PDR OS, DFE, OCT.  There are no Patient Instructions on file for this visit.  This document serves as a record of services personally performed by BGardiner Sleeper MD, PhD. It was created on their behalf by ASan Jetty BOwens Shark OA an ophthalmic technician. The creation of this record is the provider's dictation and/or activities during the visit.    Electronically signed by: ASan Jetty BOwens Shark OLansing02.21.2024 1:20 PM  BGardiner Sleeper  M.D., Ph.D. Diseases & Surgery of the Retina and [place]  I have reviewed the above documentation for accuracy and completeness, and I agree with the above. Gardiner Sleeper, M.D., Ph.D. 09/02/22 1:21 PM    Abbreviations: M myopia (nearsighted); A astigmatism; H hyperopia (farsighted); P presbyopia; Mrx spectacle prescription;  CTL contact lenses; OD right eye; OS left eye; OU both eyes  XT exotropia; ET esotropia; PEK punctate epithelial keratitis; PEE punctate epithelial erosions; DES dry eye syndrome; MGD meibomian gland dysfunction; ATs artificial tears; PFAT's preservative free artificial tears; [place] nuclear sclerotic cataract; PSC posterior subcapsular cataract; ERM epi-retinal membrane; PVD posterior vitreous detachment; RD retinal detachment; DM diabetes mellitus; DR diabetic retinopathy; NPDR non-proliferative diabetic retinopathy; PDR proliferative diabetic retinopathy; CSME clinically significant macular edema; DME diabetic macular edema; dbh dot blot hemorrhages; CWS cotton wool spot; POAG primary open angle glaucoma; C/D cup-to-disc ratio; HVF humphrey visual field; GVF goldmann visual field; OCT optical coherence tomography; IOP  intraocular pressure; BRVO Branch retinal vein occlusion; CRVO central retinal vein occlusion; CRAO central retinal artery occlusion; BRAO branch retinal artery occlusion; RT retinal tear; SB scleral buckle; PPV pars plana vitrectomy; VH Vitreous hemorrhage; PRP panretinal laser photocoagulation; IVK intravitreal kenalog; VMT vitreomacular traction; MH Macular hole;  NVD neovascularization of the disc; NVE neovascularization elsewhere; AREDS age related eye disease study; ARMD age related macular degeneration; POAG primary open angle glaucoma; EBMD epithelial/anterior basement membrane dystrophy; ACIOL anterior chamber intraocular lens; IOL intraocular lens; PCIOL posterior chamber intraocular lens; Phaco/IOL phacoemulsification with intraocular lens placement; [place] photorefractive keratectomy; LASIK laser assisted in situ keratomileusis; HTN hypertension; DM diabetes mellitus; COPD chronic obstructive pulmonary disease

## 2022-09-02 ENCOUNTER — Encounter (INDEPENDENT_AMBULATORY_CARE_PROVIDER_SITE_OTHER): Payer: Self-pay | Admitting: Ophthalmology

## 2022-09-02 ENCOUNTER — Ambulatory Visit (INDEPENDENT_AMBULATORY_CARE_PROVIDER_SITE_OTHER): Payer: Medicare Other | Admitting: Ophthalmology

## 2022-09-02 DIAGNOSIS — I10 Essential (primary) hypertension: Secondary | ICD-10-CM

## 2022-09-02 DIAGNOSIS — E113391 Type 2 diabetes mellitus with moderate nonproliferative diabetic retinopathy without macular edema, right eye: Secondary | ICD-10-CM

## 2022-09-02 DIAGNOSIS — H4312 Vitreous hemorrhage, left eye: Secondary | ICD-10-CM

## 2022-09-02 DIAGNOSIS — H04123 Dry eye syndrome of bilateral lacrimal glands: Secondary | ICD-10-CM | POA: Diagnosis not present

## 2022-09-02 DIAGNOSIS — Z961 Presence of intraocular lens: Secondary | ICD-10-CM

## 2022-09-02 DIAGNOSIS — H35033 Hypertensive retinopathy, bilateral: Secondary | ICD-10-CM

## 2022-09-02 DIAGNOSIS — E113592 Type 2 diabetes mellitus with proliferative diabetic retinopathy without macular edema, left eye: Secondary | ICD-10-CM

## 2022-09-02 MED ORDER — BEVACIZUMAB CHEMO INJECTION 1.25MG/0.05ML SYRINGE FOR KALEIDOSCOPE
1.2500 mg | INTRAVITREAL | Status: AC | PRN
  Administered 2022-09-02: 1.25 mg via INTRAVITREAL

## 2022-10-11 ENCOUNTER — Encounter (INDEPENDENT_AMBULATORY_CARE_PROVIDER_SITE_OTHER): Payer: Self-pay

## 2022-10-11 ENCOUNTER — Encounter (INDEPENDENT_AMBULATORY_CARE_PROVIDER_SITE_OTHER): Payer: Medicare Other | Admitting: Ophthalmology

## 2022-10-11 DIAGNOSIS — Z961 Presence of intraocular lens: Secondary | ICD-10-CM

## 2022-10-11 DIAGNOSIS — I10 Essential (primary) hypertension: Secondary | ICD-10-CM

## 2022-10-11 DIAGNOSIS — H04123 Dry eye syndrome of bilateral lacrimal glands: Secondary | ICD-10-CM

## 2022-10-11 DIAGNOSIS — H4312 Vitreous hemorrhage, left eye: Secondary | ICD-10-CM

## 2022-10-11 DIAGNOSIS — E113391 Type 2 diabetes mellitus with moderate nonproliferative diabetic retinopathy without macular edema, right eye: Secondary | ICD-10-CM

## 2022-10-11 DIAGNOSIS — H35033 Hypertensive retinopathy, bilateral: Secondary | ICD-10-CM

## 2022-10-11 DIAGNOSIS — E113592 Type 2 diabetes mellitus with proliferative diabetic retinopathy without macular edema, left eye: Secondary | ICD-10-CM

## 2022-10-14 ENCOUNTER — Encounter (INDEPENDENT_AMBULATORY_CARE_PROVIDER_SITE_OTHER): Payer: Medicare Other | Admitting: Ophthalmology

## 2022-10-14 DIAGNOSIS — H04123 Dry eye syndrome of bilateral lacrimal glands: Secondary | ICD-10-CM

## 2022-10-14 DIAGNOSIS — E113391 Type 2 diabetes mellitus with moderate nonproliferative diabetic retinopathy without macular edema, right eye: Secondary | ICD-10-CM

## 2022-10-14 DIAGNOSIS — E113592 Type 2 diabetes mellitus with proliferative diabetic retinopathy without macular edema, left eye: Secondary | ICD-10-CM

## 2022-10-14 DIAGNOSIS — H35033 Hypertensive retinopathy, bilateral: Secondary | ICD-10-CM

## 2022-10-14 DIAGNOSIS — H4312 Vitreous hemorrhage, left eye: Secondary | ICD-10-CM

## 2022-10-14 DIAGNOSIS — I10 Essential (primary) hypertension: Secondary | ICD-10-CM

## 2022-10-14 DIAGNOSIS — Z961 Presence of intraocular lens: Secondary | ICD-10-CM

## 2022-10-19 DIAGNOSIS — K219 Gastro-esophageal reflux disease without esophagitis: Secondary | ICD-10-CM | POA: Diagnosis not present

## 2022-10-19 DIAGNOSIS — I10 Essential (primary) hypertension: Secondary | ICD-10-CM | POA: Diagnosis not present

## 2022-10-19 DIAGNOSIS — E559 Vitamin D deficiency, unspecified: Secondary | ICD-10-CM | POA: Diagnosis not present

## 2022-10-19 DIAGNOSIS — E785 Hyperlipidemia, unspecified: Secondary | ICD-10-CM | POA: Diagnosis not present

## 2022-10-19 DIAGNOSIS — R7989 Other specified abnormal findings of blood chemistry: Secondary | ICD-10-CM | POA: Diagnosis not present

## 2022-10-19 DIAGNOSIS — E10319 Type 1 diabetes mellitus with unspecified diabetic retinopathy without macular edema: Secondary | ICD-10-CM | POA: Diagnosis not present

## 2022-10-19 DIAGNOSIS — E039 Hypothyroidism, unspecified: Secondary | ICD-10-CM | POA: Diagnosis not present

## 2022-10-20 ENCOUNTER — Other Ambulatory Visit: Payer: Self-pay | Admitting: Endocrinology

## 2022-10-20 DIAGNOSIS — Z1231 Encounter for screening mammogram for malignant neoplasm of breast: Secondary | ICD-10-CM

## 2022-10-20 NOTE — Progress Notes (Signed)
[facility] - Clinic Note  10/27/2022     CHIEF COMPLAINT Patient presents for Retina Follow Up  HISTORY OF PRESENT ILLNESS: Jamie Mays is a 72 y.o. female who presents to the clinic today for:   HPI     Retina Follow Up   Patient presents with  Other (Vitreous heme).  In left eye.  Severity is moderate.  Duration of 6 weeks.  Since onset it is stable.  I, the attending physician,  performed the HPI with the patient and updated documentation appropriately.        Comments   Patient states vision the same OU. BS is 122 by meter patient is wearing. Last A1c was 6.1, checked yesterday. Patient taking restasis bid OU and systane prn OU.      Last edited by Rennis Chris, MD on 10/27/2022  9:03 AM.    Pt is delayed to follow up from 6 weeks to 8 weeks, she is still seeing floaters, but says they are very faint  Referring physician: Adrian Prince, MD [address] [place],  [place] [address]  HISTORICAL INFORMATION:  Selected notes from the MEDICAL RECORD NUMBER Referred by Dr. Elmer Picker for acute [facility] OS LEE:  Ocular Hx- PMH-    CURRENT MEDICATIONS: Current Outpatient Medications (Ophthalmic Drugs)  Medication Sig   cycloSPORINE (RESTASIS) 0.05 % ophthalmic emulsion Place 1 drop into both eyes 2 (two) times daily.   hydroxypropyl methylcellulose / hypromellose (ISOPTO TEARS / GONIOVISC) 2.5 % ophthalmic solution Place 1 drop into both eyes as needed for dry eyes.   Loteprednol Etabonate (LOTEMAX SM) 0.38 % GEL Place 1 drop into the left eye 4 (four) times daily.   XIIDRA 5 % SOLN Apply 1 drop to eye 2 (two) times daily.   No current facility-administered medications for this visit. (Ophthalmic Drugs)   Current Outpatient Medications (Other)  Medication Sig   calcium carbonate (TUMS - DOSED IN MG ELEMENTAL CALCIUM) 500 MG chewable tablet Chew 2 tablets by mouth daily as needed for indigestion or heartburn.   Famotidine (ZANTAC 360 PO) Take 360 mg by  mouth daily as needed (heartburn).   glucose blood test strip 1 each by Other route 6 (six) times daily. Use as instructed    irbesartan (AVAPRO) 150 MG tablet Take 150 mg by mouth daily.   levothyroxine (SYNTHROID, LEVOTHROID) 75 MCG tablet Take 1 tablet (75 mcg total) by mouth daily. (Patient taking differently: Take 75 mcg by mouth See admin instructions. Take 75 mg on Mon, Tue, Thurs, Fri, and Sat.  Skip Sun and Wed dose)   Magnesium 200 MG TABS Take 400 mg by mouth daily.   Omega-3 Fatty Acids (FISH OIL ADULT GUMMIES PO) Take 2 capsules by mouth daily.   pregabalin (LYRICA) 50 MG capsule Take 50 mg by mouth 2 (two) times daily. Patient taking once daily, unless traveling. Then patient takes twice daily.   propranolol (INDERAL) 20 MG tablet TAKE 0.5-1 TABLETS (10-20 MG TOTAL) BY MOUTH 3 (THREE) TIMES DAILY AS NEEDED (PALPITATIONS).   Turmeric 500 MG CAPS Take 500 mg by mouth daily.   Vitamin D, Ergocalciferol, (DRISDOL) 1.25 MG (50000 UT) CAPS capsule Take 50,000 Units by mouth every Tuesday.    insulin aspart (NOVOLOG) 100 UNIT/ML injection Inject 75-100 Units into the skin daily. Via insulin pump as directed   No current facility-administered medications for this visit. (Other)   REVIEW OF SYSTEMS: ROS   Positive for: Musculoskeletal, Endocrine, Eyes Negative for: Constitutional,  Gastrointestinal, Neurological, Skin, Genitourinary, HENT, Cardiovascular, Respiratory, Psychiatric, Allergic/Imm, Heme/Lymph Last edited by Doreene Nest, COT on 10/27/2022  7:47 AM.     ALLERGIES Allergies  Allergen Reactions   Doxycycline Other (See Comments)    Joint pain   Lisinopril Cough   Minocycline Other (See Comments)    Dizziness    Oxycodone     Hallucinations    Prednisone Other (See Comments)    "Nausea and weakness"   PAST MEDICAL HISTORY Past Medical History:  Diagnosis Date   Ankle fracture    left   Arthritis    Diabetes mellitus    Diabetic retinopathy    Family history  of adverse reaction to anesthesia    " they had difficulty waking my dad up; he was about 26-16 years old;" neice had anaphylactic reaction after anesthesia   Family history of MI (myocardial infarction)    GERD (gastroesophageal reflux disease)    Hypertension    Hypertensive retinopathy    Hypothyroidism    Painful orthopaedic hardware    PVC (premature ventricular contraction)    PVC's (premature ventricular contractions)    Smoker    former  " quit about 40 years ago"   Syncope    Vitreous hemorrhage of left eye    Wears glasses    Past Surgical History:  Procedure Laterality Date   CARDIAC CATHETERIZATION  2007   neg   CATARACT EXTRACTION     CATARACT EXTRACTION W/ INTRAOCULAR LENS  IMPLANT, BILATERAL     CESAREAN SECTION     DUPUYTREN CONTRACTURE RELEASE Left 07/20/2021   Procedure: DUPUYTREN CONTRACTURE RELEASE, left small finger;  Surgeon: Gomez Cleverly, MD;  Location: [facility] [place] [facility];  Service: Orthopedics;  Laterality: Left;   EYE SURGERY     HARDWARE REMOVAL Left 03/19/2020   Procedure: HARDWARE REMOVAL LEFT ANKLE;  Surgeon: Roby Lofts, MD;  Location: [facility] OR;  Service: Orthopedics;  Laterality: Left;   ORIF ANKLE FRACTURE Left 07/19/2019   Procedure: OPEN REDUCTION INTERNAL FIXATION (ORIF) ANKLE FRACTURE;  Surgeon: Roby Lofts, MD;  Location: [facility] OR;  Service: Orthopedics;  Laterality: Left;   FAMILY HISTORY Family History  Problem Relation Age of Onset   Coronary artery disease Mother    Kidney disease Mother    Diabetes Mother    Heart disease Mother        heart attack   Hypertension Mother    Macular degeneration Father    Diabetes Sister    Diabetes Maternal Grandmother    Diabetes Brother    Cancer Brother        lung cancer   Cancer Brother 68       lungs, brain, chest wall, liver cancer, stage 4   Cancer Other 70       lung , brain   Breast cancer Niece    SOCIAL HISTORY Social History   Tobacco Use   Smoking status: Former     Types: Cigarettes    Quit date: 07/12/1981    Years since quitting: 41.3   Smokeless tobacco: Never  Vaping Use   Vaping Use: Never used  Substance Use Topics   Alcohol use: Not Currently    Alcohol/week: 0.0 standard drinks of alcohol    Comment: very rare   Drug use: No       OPHTHALMIC EXAM:  Base Eye Exam     Visual Acuity (Snellen - Linear)       Right Left   Dist  cc 20/50 +1 20/25 -1   Dist ph cc 20/30 -1 20/25 +1    Correction: Glasses         Tonometry (Tonopen, 7:59 AM)       Right Left   Pressure 16 13         Pupils       Dark Light Shape React APD   Right 4 3 Round Brisk None   Left 4 3 Round Brisk None         Visual Fields (Counting fingers)       Left Right    Full Full         Extraocular Movement       Right Left    Full, Ortho Full, Ortho         Neuro/Psych     Oriented x3: Yes   Mood/Affect: Normal         Dilation     Both eyes: 1.0% Mydriacyl, 2.5% Phenylephrine @ 7:59 AM           Slit Lamp and Fundus Exam     Slit Lamp Exam       Right Left   Lids/Lashes Dermatochalasis - upper lid, Meibomian gland dysfunction Dermatochalasis - upper lid, Meibomian gland dysfunction   Conjunctiva/Sclera White and quiet temporal pinguecula   Cornea mild arcus, +EBMD, trace tear film debris, 2+PEE Mild arcus, EBMD, well healed cataract wound, 2+ Punctate epithelial erosions   Anterior Chamber deep, clear, narrow temporal angle Deep and clear   Iris Round and dilated Round and dilated, No NVI   Lens PC IOL in good position, trace PCO PC IOL in good position, trace PCO   Anterior Vitreous Vitreous syneresis, Posterior vitreous detachment Vitreous syneresis, PVD, blood stained vitreous condensations clearing and settling inferiorly, residual white blood clots settled inferiorly         Fundus Exam       Right Left   Disc mild Pallor, Sharp rim Mild, temporal pallor, sharp rim, PPP   C/D Ratio 0.2 0.2   Macula Flat,  Good foveal reflex, RPE mottling, No heme or edema Flat, good foveal reflex, mild RPE mottling, rare MA, No edema   Vessels mild attenuation, mild tortuosity attenuated, mild tortuosity   Periphery Attached, scattered MA/360 DBH greatest inferior periphery attached, scattered MA/DBH 360, no RT/RD, 360 PRP with room for fill in           Refraction     Wearing Rx       Sphere Cylinder Axis Add   Right -0.50 +0.50 153 +2.50   Left -0.25 +1.50 160 +2.50    Type: PAL            IMAGING AND PROCEDURES  Imaging and Procedures for 10/27/2022  OCT, Retina - OU - Both Eyes       Right Eye Quality was good. Central Foveal Thickness: 278. Progression has been stable. Findings include normal foveal contour, no IRF, no SRF (Trace ERM).   Left Eye Quality was borderline. Central Foveal Thickness: 287. Progression has improved. Findings include normal foveal contour, no IRF, no SRF (Trace ERM, stable improvement in vitreous opacities, trace cystic changes SN mac and inferior midzone caught on widefield -- stable).   Notes *Images captured and stored on drive  Diagnosis / Impression:  OD: Trace ERM OS: Trace ERM, stable improvement in vitreous opacities, trace cystic changes SN mac and inferior midzone caught on widefield -- stable  Clinical management:  See  below  Abbreviations: NFP - Normal foveal profile. CME - cystoid macular edema. PED - pigment epithelial detachment. IRF - intraretinal fluid. SRF - subretinal fluid. EZ - ellipsoid zone. ERM - epiretinal membrane. ORA - outer retinal atrophy. ORT - outer retinal tubulation. SRHM - subretinal hyper-reflective material. IRHM - intraretinal hyper-reflective material            ASSESSMENT/PLAN:    ICD-10-CM   1. Vitreous hemorrhage of left eye  H43.12 OCT, Retina - OU - Both Eyes    2. Proliferative diabetic retinopathy of left eye without macular edema associated with type 2 diabetes mellitus  E11.3592 OCT, Retina - OU -  Both Eyes    3. Moderate nonproliferative diabetic retinopathy of right eye without macular edema associated with type 2 diabetes mellitus  E11.3391     4. Essential hypertension  I10     5. Hypertensive retinopathy of both eyes  H35.033     6. Pseudophakia, both eyes  Z96.1     7. Dry eye syndrome of both eyes  H04.123      1,2. Vitreous Hemorrhage OS -- recurrent onset ~07/23/22 -- PDR OS  - original onset of VH overnight and noted upon waking (06.22.22)  - recurrence on 01.12.24  - s/p IVA OS #1 (06.22.22), #2 (07.27.22), #3 (09.14.22), #4 (01.16.24), #5 (02.21.24)  - s/p PRP OS (01.18.24)  - b-scan 6.29.22 without RT/RD or mass  - exam shows interval improvement in VH - OCT shows Trace ERM, stable improvement in vitreous opacities, trace cystic changes SN mac and inferior midzone caught on widefield -- stable  - BCVA 20/25 OS - FA (8.24.22) shows interval improvement in blockage/ VH compared to 06.22.22 FA; no NV OU - repeat FA (01.16.24) shows new focal NV inf nasal peripheral midzone -- likely etiology of VH - recommend holding injection today - pt in agreement - IVA consent was obtained and signed 01.16.24 (OS) - f/u 3 months -- DFE/OCT,/FA (transit OS), possible injection   3. Moderate nonproliferative diabetic retinopathy w/o DME OD - FA 06.22.22, 08.24.22 and 01.16.24-- no NV OD, just leaking MA and mild peripheral perivascular leakage - OCT without diabetic macular edema, both eyes   - continue to monitor  4,5. Hypertensive retinopathy OU - discussed importance of tight BP control - continue to monitor  6. Pseudophakia OU  - s/p CE/IOL OU  - IOL's in good position, doing well  - mild PCO OU - pt inquiring about YAG laser  - continue to monitor  7,8. Dry eyes  w/ ABMD OU - recommend artificial tears and lubricating ointment as needed  - dryness is changing glasses rx - recommend Systane Gel OU QHS - continue Restasis OU BID as instructed by Dr.  Bascom Levels  Ophthalmic Meds Ordered this visit:  No orders of the defined types were placed in this encounter.    Return in about 3 months (around 01/26/2023) for f/u VH OS, DFE, OCT, FA (transit OS).  There are no Patient Instructions on file for this visit.  This document serves as a record of services personally performed by Karie Chimera, MD, PhD. It was created on their behalf by De Blanch, an ophthalmic technician. The creation of this record is the provider's dictation and/or activities during the visit.    Electronically signed by: De Blanch, OA, 10/27/22  9:04 AM  This document serves as a record of services personally performed by Karie Chimera, MD, PhD. It was created on their behalf by  Glee ArvinAmanda J. Manson PasseyBrown, OA an ophthalmic technician. The creation of this record is the provider's dictation and/or activities during the visit.    Electronically signed by: Glee ArvinAmanda J. Manson PasseyBrown, [place] 04.17.2024 9:04 AM  Karie ChimeraBrian G. Latacha Texeira, M.D., Ph.D. Diseases & Surgery of the Retina and Vitreous [facility]  I have reviewed the above documentation for accuracy and completeness, and I agree with the above. Karie ChimeraBrian G. Charley Miske, M.D., Ph.D. 10/27/22 9:12 AM   Abbreviations: M myopia (nearsighted); A astigmatism; H hyperopia (farsighted); P presbyopia; Mrx spectacle prescription;  CTL contact lenses; OD right eye; OS left eye; OU both eyes  XT exotropia; ET esotropia; PEK punctate epithelial keratitis; PEE punctate epithelial erosions; DES dry eye syndrome; MGD meibomian gland dysfunction; ATs artificial tears; PFAT's preservative free artificial tears; NSC nuclear sclerotic cataract; PSC posterior subcapsular cataract; ERM epi-retinal membrane; PVD posterior vitreous detachment; RD retinal detachment; DM diabetes mellitus; DR diabetic retinopathy; NPDR non-proliferative diabetic retinopathy; PDR proliferative diabetic retinopathy; CSME clinically significant macular edema; DME  diabetic macular edema; dbh dot blot hemorrhages; CWS cotton wool spot; POAG primary open angle glaucoma; C/D cup-to-disc ratio; HVF humphrey visual field; GVF goldmann visual field; OCT optical coherence tomography; IOP intraocular pressure; BRVO Branch retinal vein occlusion; CRVO central retinal vein occlusion; CRAO central retinal artery occlusion; BRAO branch retinal artery occlusion; RT retinal tear; SB scleral buckle; PPV pars plana vitrectomy; VH Vitreous hemorrhage; PRP panretinal laser photocoagulation; IVK intravitreal kenalog; VMT vitreomacular traction; MH Macular hole;  NVD neovascularization of the disc; NVE neovascularization elsewhere; AREDS age related eye disease study; ARMD age related macular degeneration; POAG primary open angle glaucoma; EBMD epithelial/anterior basement membrane dystrophy; ACIOL anterior chamber intraocular lens; IOL intraocular lens; PCIOL posterior chamber intraocular lens; Phaco/IOL phacoemulsification with intraocular lens placement; PRK photorefractive keratectomy; LASIK laser assisted in situ keratomileusis; HTN hypertension; DM diabetes mellitus; COPD chronic obstructive pulmonary disease

## 2022-10-26 DIAGNOSIS — E042 Nontoxic multinodular goiter: Secondary | ICD-10-CM | POA: Diagnosis not present

## 2022-10-26 DIAGNOSIS — Z23 Encounter for immunization: Secondary | ICD-10-CM | POA: Diagnosis not present

## 2022-10-26 DIAGNOSIS — Z0000 Encounter for general adult medical examination without abnormal findings: Secondary | ICD-10-CM | POA: Diagnosis not present

## 2022-10-26 DIAGNOSIS — E039 Hypothyroidism, unspecified: Secondary | ICD-10-CM | POA: Diagnosis not present

## 2022-10-26 DIAGNOSIS — E559 Vitamin D deficiency, unspecified: Secondary | ICD-10-CM | POA: Diagnosis not present

## 2022-10-26 DIAGNOSIS — E785 Hyperlipidemia, unspecified: Secondary | ICD-10-CM | POA: Diagnosis not present

## 2022-10-26 DIAGNOSIS — H4312 Vitreous hemorrhage, left eye: Secondary | ICD-10-CM | POA: Diagnosis not present

## 2022-10-26 DIAGNOSIS — Z1339 Encounter for screening examination for other mental health and behavioral disorders: Secondary | ICD-10-CM | POA: Diagnosis not present

## 2022-10-26 DIAGNOSIS — E10319 Type 1 diabetes mellitus with unspecified diabetic retinopathy without macular edema: Secondary | ICD-10-CM | POA: Diagnosis not present

## 2022-10-26 DIAGNOSIS — Z1331 Encounter for screening for depression: Secondary | ICD-10-CM | POA: Diagnosis not present

## 2022-10-26 DIAGNOSIS — I493 Ventricular premature depolarization: Secondary | ICD-10-CM | POA: Diagnosis not present

## 2022-10-26 DIAGNOSIS — I10 Essential (primary) hypertension: Secondary | ICD-10-CM | POA: Diagnosis not present

## 2022-10-27 ENCOUNTER — Ambulatory Visit (INDEPENDENT_AMBULATORY_CARE_PROVIDER_SITE_OTHER): Payer: Medicare Other | Admitting: Ophthalmology

## 2022-10-27 ENCOUNTER — Encounter (INDEPENDENT_AMBULATORY_CARE_PROVIDER_SITE_OTHER): Payer: Self-pay | Admitting: Ophthalmology

## 2022-10-27 DIAGNOSIS — E113592 Type 2 diabetes mellitus with proliferative diabetic retinopathy without macular edema, left eye: Secondary | ICD-10-CM | POA: Diagnosis not present

## 2022-10-27 DIAGNOSIS — Z961 Presence of intraocular lens: Secondary | ICD-10-CM | POA: Diagnosis not present

## 2022-10-27 DIAGNOSIS — E113391 Type 2 diabetes mellitus with moderate nonproliferative diabetic retinopathy without macular edema, right eye: Secondary | ICD-10-CM

## 2022-10-27 DIAGNOSIS — H4312 Vitreous hemorrhage, left eye: Secondary | ICD-10-CM | POA: Diagnosis not present

## 2022-10-27 DIAGNOSIS — H04123 Dry eye syndrome of bilateral lacrimal glands: Secondary | ICD-10-CM | POA: Diagnosis not present

## 2022-10-27 DIAGNOSIS — H35033 Hypertensive retinopathy, bilateral: Secondary | ICD-10-CM | POA: Diagnosis not present

## 2022-10-27 DIAGNOSIS — I10 Essential (primary) hypertension: Secondary | ICD-10-CM | POA: Diagnosis not present

## 2022-10-27 DIAGNOSIS — H18523 Epithelial (juvenile) corneal dystrophy, bilateral: Secondary | ICD-10-CM

## 2023-01-04 ENCOUNTER — Emergency Department (HOSPITAL_COMMUNITY)
Admission: EM | Admit: 2023-01-04 | Discharge: 2023-01-05 | Disposition: A | Discharge status: Home / Self Care | Payer: Medicare Other | Attending: Emergency Medicine | Admitting: Emergency Medicine

## 2023-01-04 ENCOUNTER — Other Ambulatory Visit: Payer: Self-pay

## 2023-01-04 DIAGNOSIS — R55 Syncope and collapse: Secondary | ICD-10-CM | POA: Diagnosis not present

## 2023-01-04 DIAGNOSIS — I10 Essential (primary) hypertension: Secondary | ICD-10-CM | POA: Insufficient documentation

## 2023-01-04 DIAGNOSIS — R531 Weakness: Secondary | ICD-10-CM | POA: Diagnosis not present

## 2023-01-04 DIAGNOSIS — Z79899 Other long term (current) drug therapy: Secondary | ICD-10-CM | POA: Insufficient documentation

## 2023-01-04 DIAGNOSIS — R110 Nausea: Secondary | ICD-10-CM | POA: Insufficient documentation

## 2023-01-04 DIAGNOSIS — R42 Dizziness and giddiness: Secondary | ICD-10-CM | POA: Insufficient documentation

## 2023-01-04 DIAGNOSIS — Z794 Long term (current) use of insulin: Secondary | ICD-10-CM | POA: Diagnosis not present

## 2023-01-04 DIAGNOSIS — E119 Type 2 diabetes mellitus without complications: Secondary | ICD-10-CM | POA: Insufficient documentation

## 2023-01-04 LAB — BASIC METABOLIC PANEL
Anion gap: 12 (ref 5–15)
BUN: 19 mg/dL (ref 8–23)
CO2: 27 mmol/L (ref 22–32)
Calcium: 10 mg/dL (ref 8.9–10.3)
Chloride: 102 mmol/L (ref 98–111)
Creatinine, Ser: 0.85 mg/dL (ref 0.44–1.00)
GFR, Estimated: >60 mL/min (ref >60)
Glucose, Bld: 72 mg/dL (ref 70–99)
Potassium: 4 mmol/L (ref 3.5–5.1)
Sodium: 141 mmol/L (ref 135–145)

## 2023-01-04 LAB — URINALYSIS, ROUTINE W REFLEX MICROSCOPIC
Bacteria, UA: NONE SEEN
Bilirubin Urine: NEGATIVE
Glucose, UA: NEGATIVE mg/dL
Hgb urine dipstick: NEGATIVE
Ketones, ur: NEGATIVE mg/dL
Nitrite: NEGATIVE
Protein, ur: NEGATIVE mg/dL
Specific Gravity, Urine: 1.016 (ref 1.005–1.030)
pH: 5 (ref 5.0–8.0)

## 2023-01-04 LAB — CBC
HCT: 41.4 % (ref 36.0–46.0)
Hemoglobin: 14.3 g/dL (ref 12.0–15.0)
MCH: 29.9 pg (ref 26.0–34.0)
MCHC: 34.5 g/dL (ref 30.0–36.0)
MCV: 86.4 fL (ref 80.0–100.0)
Platelets: 214 10*3/uL (ref 150–400)
RBC: 4.79 MIL/uL (ref 3.87–5.11)
RDW: 12.4 % (ref 11.5–15.5)
WBC: 8.7 10*3/uL (ref 4.0–10.5)
nRBC: 0 % (ref 0.0–0.2)

## 2023-01-04 LAB — TROPONIN I (HIGH SENSITIVITY): Troponin I (High Sensitivity): 4 ng/L (ref <18)

## 2023-01-04 MED ORDER — SODIUM CHLORIDE 0.9 % IV BOLUS
1000.0000 mL | Freq: Once | INTRAVENOUS | Status: AC
  Administered 2023-01-05: 1000 mL via INTRAVENOUS

## 2023-01-04 NOTE — ED Triage Notes (Signed)
Pt states she was sitting on the couch and had a sudden episode of feeling weak and nauseated and like she might "pass out"  symptoms resolved but she still "doesn't feel right"

## 2023-01-04 NOTE — ED Provider Notes (Signed)
[place] EMERGENCY DEPARTMENT AT [facility] Provider Note   CSN: [id number] Arrival date & time: 01/04/23  1821     History  Chief Complaint  Patient presents with   Near Syncope    Jamie Mays is a 72 y.o. female with past medical history significant for diabetes, hyperlipidemia, hypertension who presents with concern for sudden feeling of weakness, nausea, and lightheadedness earlier today while at the grocery store.  Patient reports it only lasted for a few minutes and then resolved spontaneously.  She reports that she has had vertigo, with recent diagnosis of labyrinthitis, but this does not feel vertiginous at all.    Near Syncope       Home Medications Prior to Admission medications   Medication Sig Start Date End Date Taking? Authorizing Provider  calcium carbonate (TUMS - DOSED IN MG ELEMENTAL CALCIUM) 500 MG chewable tablet Chew 2 tablets by mouth daily as needed for indigestion or heartburn.    [provider]  cycloSPORINE (RESTASIS) 0.05 % ophthalmic emulsion Place 1 drop into both eyes 2 (two) times daily.    [provider]  Famotidine (ZANTAC 360 PO) Take 360 mg by mouth daily as needed (heartburn).    [provider]  glucose blood test strip 1 each by Other route 6 (six) times daily. Use as instructed     [provider]  hydroxypropyl methylcellulose / hypromellose (ISOPTO TEARS / GONIOVISC) 2.5 % ophthalmic solution Place 1 drop into both eyes as needed for dry eyes.    [provider]  insulin aspart (NOVOLOG) 100 UNIT/ML injection Inject 75-100 Units into the skin daily. Via insulin pump as directed    [provider]  irbesartan (AVAPRO) 150 MG tablet Take 150 mg by mouth daily.    [provider]  levothyroxine (SYNTHROID, LEVOTHROID) 75 MCG tablet Take 1 tablet (75 mcg total) by mouth daily. Patient taking differently: Take 75 mcg by mouth See admin instructions. Take 75 mg on  Mon, Tue, Thurs, Fri, and Sat.  Skip Sun and Wed dose 03/29/16   Nestor Ramp, MD  Loteprednol Etabonate (LOTEMAX SM) 0.38 % GEL Place 1 drop into the left eye 4 (four) times daily. 07/29/22   Rennis Chris, MD  Magnesium 200 MG TABS Take 400 mg by mouth daily.    [provider]  Omega-3 Fatty Acids (FISH OIL ADULT GUMMIES PO) Take 2 capsules by mouth daily.    [provider]  pregabalin (LYRICA) 50 MG capsule Take 50 mg by mouth 2 (two) times daily. Patient taking once daily, unless traveling. Then patient takes twice daily.    [provider]  propranolol (INDERAL) 20 MG tablet TAKE 0.5-1 TABLETS (10-20 MG TOTAL) BY MOUTH 3 (THREE) TIMES DAILY AS NEEDED (PALPITATIONS). 07/13/22   Antonieta Iba, MD  Turmeric 500 MG CAPS Take 500 mg by mouth daily.    [provider]  Vitamin D, Ergocalciferol, (DRISDOL) 1.25 MG (50000 UT) CAPS capsule Take 50,000 Units by mouth every Tuesday.  03/14/19   [provider]  XIIDRA 5 % SOLN Apply 1 drop to eye 2 (two) times daily. 05/15/21   [provider]      Allergies    Doxycycline, Lisinopril, Minocycline, Oxycodone, and Prednisone    Review of Systems   Review of Systems  Cardiovascular:  Positive for near-syncope.  All other systems reviewed and are negative.   Physical Exam Updated Vital Signs BP (!) 172/70 (BP Location: Right  Arm)   Pulse (!) 56   Temp 98 F (36.7 C) (Oral)   Resp 19   SpO2 97%  Physical Exam Vitals and nursing note reviewed.  Constitutional:      General: She is not in acute distress.    Appearance: Normal appearance.  HENT:     Head: Normocephalic and atraumatic.  Eyes:     General:        Right eye: No discharge.        Left eye: No discharge.  Cardiovascular:     Rate and Rhythm: Normal rate and regular rhythm.     Heart sounds: No murmur heard.    No friction rub. No gallop.  Pulmonary:     Effort: Pulmonary effort is normal.     Breath sounds: Normal  breath sounds.  Abdominal:     General: Bowel sounds are normal.     Palpations: Abdomen is soft.  Skin:    General: Skin is warm and dry.     Capillary Refill: Capillary refill takes less than 2 seconds.  Neurological:     Mental Status: She is alert and oriented to person, place, and time.     Comments: Cranial nerves II through XII grossly intact.  Intact finger-nose, intact heel-to-shin.  Romberg negative, gait normal.  Alert and oriented x3.  Moves all 4 limbs spontaneously, normal coordination.  No pronator drift.  Intact strength 5 out of 5 bilateral upper and lower extremities.    Psychiatric:        Mood and Affect: Mood normal.        Behavior: Behavior normal.     ED Results / Procedures / Treatments   Labs (all labs ordered are listed, but only abnormal results are displayed) Labs Reviewed  URINALYSIS, ROUTINE W REFLEX MICROSCOPIC - Abnormal; Notable for the following components:      Result Value   Leukocytes,Ua TRACE (*)    All other components within normal limits  BASIC METABOLIC PANEL  CBC  D-DIMER, QUANTITATIVE  TROPONIN I (HIGH SENSITIVITY)    EKG EKG Interpretation  Date/Time:  Tuesday January 04 2023 22:30:22 EDT Ventricular Rate:  52 PR Interval:  170 QRS Duration: 149 QT Interval:  490 QTC Calculation: 456 R Axis:   108 Text Interpretation: Sinus rhythm RBBB and LPFB Confirmed by Vonita Moss [id number]) on 01/04/2023 10:37:42 PM  Radiology No results found.  Procedures Procedures    Medications Ordered in ED Medications  sodium chloride 0.9 % bolus 1,000 mL (has no administration in time range)    ED Course/ Medical Decision Making/ A&P                             Medical Decision Making Amount and/or Complexity of Data Reviewed Labs: ordered.   This patient is a 72 y.o. female  who presents to the ED for concern of lightheadedness.   Differential diagnoses prior to evaluation: The emergent differential diagnosis includes, but is  not limited to,  CVA, ACS, arrhythmia, vasovagal syncope, orthostatic hypotension, sepsis, hypoglycemia, electrolyte disturbance, respiratory failure, symptomatic anemia, dehydration, heat injury, polypharmacy, malignancy, anxiety/panic attack . This is not an exhaustive differential.   Past Medical History / Co-morbidities / Social History: Diabetes, hyperlipidemia, hypertension, previous labyrinthitis  Physical Exam: Physical exam performed. The pertinent findings include: No focal neurologic deficits, no respiratory distress or accessory breath sounds noted.  Normal heart rate and rhythm.  Somewhat hypertensive in the  emergency department, with pressure 172/70.  Lab Tests/Imaging studies: I personally interpreted labs/imaging and the pertinent results include: CBC unremarkable, BMP unremarkable, UA unremarkable.  Normal troponin, and D-dimer pending at this time.  Cardiac monitoring: EKG obtained and interpreted by myself and attending physician which shows: Normal sinus rhythm with right bundle branch block   Medications: I ordered medication including fluid bolus due to concern for possible dehydration with orthostatic lightheadedness.  I have reviewed the patients home medicines and have made adjustments as needed.   11:54 PM Care of Jamie Mays transferred to [facility] Sharilyn Sites and Dr. Nicanor Alcon at the end of my shift as the patient will require reassessment once labs/imaging have resulted. Patient presentation, ED course, and plan of care discussed with review of all pertinent labs and imaging. Please see his/her note for further details regarding further ED course and disposition. Plan at time of handoff is pending d-dimer, re-evaluation after fluid bolus, patient is stable for discharge with nonspecific lightheadedness and shortness of breath with no evidence of acute pathology in the ED today. This may be altered or completely changed at the discretion of the oncoming team pending results  of further workup.   Final Clinical Impression(s) / ED Diagnoses Final diagnoses:  None    Rx / DC Orders ED Discharge Orders     None         [place] 01/04/23 2354    Rondel Baton, MD 01/05/23 2342

## 2023-01-04 NOTE — ED Notes (Signed)
Pt ambulatory to restroom at this time.  Pt states she had an "episode" a few minutes ago where she could not catch her breath.  Denies associated chest pain.

## 2023-01-05 DIAGNOSIS — R55 Syncope and collapse: Secondary | ICD-10-CM | POA: Diagnosis not present

## 2023-01-05 LAB — D-DIMER, QUANTITATIVE: D-Dimer, Quant: 0.39 ug/mL-FEU (ref 0.00–0.50)

## 2023-01-05 NOTE — Discharge Instructions (Addendum)
Your labs today were reassuring.  Make sure to eat/drink well. We do recommend that you follow-up with your primary care doctor. Return here for new concerns.

## 2023-01-05 NOTE — ED Notes (Signed)
Pt provided with AVS.  Education complete; all questions answered.  Pt leaving ED in stable condition at this time, ambulatory with all belongings. 

## 2023-01-05 NOTE — ED Provider Notes (Signed)
  12:46 AM D-dimer is negative.  Orthostatics are appropriate.  Ambulatory here in the ED with steady gait.  Stable for discharge-- recommend to hydrate well, follow-up with PCP.  Return here for new concerns.   Garlon Hatchet, PA-C 01/05/23 Berenice Primas, April, MD 01/05/23 [id number]

## 2023-01-07 ENCOUNTER — Ambulatory Visit
Admission: RE | Admit: 2023-01-07 | Discharge: 2023-01-07 | Disposition: A | Discharge status: Home / Self Care | Payer: Medicare Other | Source: Ambulatory Visit | Attending: Endocrinology | Admitting: Endocrinology

## 2023-01-07 DIAGNOSIS — Z1231 Encounter for screening mammogram for malignant neoplasm of breast: Secondary | ICD-10-CM

## 2023-01-12 ENCOUNTER — Telehealth: Payer: Self-pay

## 2023-01-12 NOTE — Telephone Encounter (Signed)
Transition Care Management Unsuccessful Follow-up Telephone Call  Date of discharge and from where:  01/05/2023 [facility]  Attempts:  2nd Attempt  Reason for unsuccessful TCM follow-up call:  Left voice message  Kobi Mario [facility]  [facility] Population Health Community Resource Care Guide   [email or web address]  ?? [phone]   Website: [email or web address]  [email or web address]

## 2023-01-12 NOTE — Telephone Encounter (Signed)
Transition Care Management Unsuccessful Follow-up Telephone Call  Date of discharge and from where:  01/05/2023 [facility]  Attempts:  1st Attempt  Reason for unsuccessful TCM follow-up call:  Left voice message  Caide Campi [facility]  [facility] Population Health Community Resource Care Guide   [email or web address]  ?? [phone]   Website: [email or web address]  [email or web address]

## 2023-01-20 NOTE — Progress Notes (Signed)
[facility] - Clinic Note  01/26/2023     CHIEF COMPLAINT Patient presents for Retina Follow Up  HISTORY OF PRESENT ILLNESS: Jamie Mays is a 72 y.o. female who presents to the clinic today for:   HPI     Retina Follow Up   Patient presents with  Other.  In left eye.  Severity is moderate.  Duration of 3 months.  Since onset it is stable.  I, the attending physician,  performed the HPI with the patient and updated documentation appropriately.        Comments   Pt here for 3 mo ret f/u VH OS. Pt states VA is good, no new floaters. Feels like things are stable. Has a floater in OD since she's been in but it is getting better. Pt reports film over OD still, needing YAG as discussed.       Last edited by Rennis Chris, MD on 01/26/2023  8:28 AM.      Referring physician: Adrian Prince, MD [address] [place],  [place] [address]  HISTORICAL INFORMATION:  Selected notes from the MEDICAL RECORD NUMBER Referred by Dr. Elmer Picker for acute [facility] OS LEE:  Ocular Hx- PMH-    CURRENT MEDICATIONS: Current Outpatient Medications (Ophthalmic Drugs)  Medication Sig   cycloSPORINE (RESTASIS) 0.05 % ophthalmic emulsion Place 1 drop into both eyes 2 (two) times daily.   hydroxypropyl methylcellulose / hypromellose (ISOPTO TEARS / GONIOVISC) 2.5 % ophthalmic solution Place 1 drop into both eyes as needed for dry eyes.   Loteprednol Etabonate (LOTEMAX SM) 0.38 % GEL Place 1 drop into the left eye 4 (four) times daily.   XIIDRA 5 % SOLN Apply 1 drop to eye 2 (two) times daily.   No current facility-administered medications for this visit. (Ophthalmic Drugs)   Current Outpatient Medications (Other)  Medication Sig   calcium carbonate (TUMS - DOSED IN MG ELEMENTAL CALCIUM) 500 MG chewable tablet Chew 2 tablets by mouth daily as needed for indigestion or heartburn.   Famotidine (ZANTAC 360 PO) Take 360 mg by mouth daily as needed (heartburn).   glucose blood test strip 1  each by Other route 6 (six) times daily. Use as instructed    irbesartan (AVAPRO) 150 MG tablet Take 150 mg by mouth daily.   levothyroxine (SYNTHROID, LEVOTHROID) 75 MCG tablet Take 1 tablet (75 mcg total) by mouth daily. (Patient taking differently: Take 75 mcg by mouth See admin instructions. Take 75 mg on Mon, Tue, Thurs, Fri, and Sat.  Skip Sun and Wed dose)   Magnesium 200 MG TABS Take 400 mg by mouth daily.   Omega-3 Fatty Acids (FISH OIL ADULT GUMMIES PO) Take 2 capsules by mouth daily.   pregabalin (LYRICA) 50 MG capsule Take 50 mg by mouth 2 (two) times daily. Patient taking once daily, unless traveling. Then patient takes twice daily.   propranolol (INDERAL) 20 MG tablet TAKE 0.5-1 TABLETS (10-20 MG TOTAL) BY MOUTH 3 (THREE) TIMES DAILY AS NEEDED (PALPITATIONS).   Turmeric 500 MG CAPS Take 500 mg by mouth daily.   Vitamin D, Ergocalciferol, (DRISDOL) 1.25 MG (50000 UT) CAPS capsule Take 50,000 Units by mouth every Tuesday.    insulin aspart (NOVOLOG) 100 UNIT/ML injection Inject 75-100 Units into the skin daily. Via insulin pump as directed   No current facility-administered medications for this visit. (Other)   REVIEW OF SYSTEMS: ROS   Positive for: Musculoskeletal, Endocrine, Eyes Negative for: Constitutional, Gastrointestinal, Neurological, Skin, Genitourinary, HENT, Cardiovascular,  Respiratory, Psychiatric, Allergic/Imm, Heme/Lymph Last edited by Thompson Grayer, COT on 01/26/2023  7:55 AM.      ALLERGIES Allergies  Allergen Reactions   Doxycycline Other (See Comments)    Joint pain   Lisinopril Cough   Minocycline Other (See Comments)    Dizziness    Oxycodone     Hallucinations    Prednisone Other (See Comments)    "Nausea and weakness"   PAST MEDICAL HISTORY Past Medical History:  Diagnosis Date   Ankle fracture    left   Arthritis    Diabetes mellitus    Diabetic retinopathy (HCC)    Family history of adverse reaction to anesthesia    " they had  difficulty waking my dad up; he was about 35-71 years old;" neice had anaphylactic reaction after anesthesia   Family history of MI (myocardial infarction)    GERD (gastroesophageal reflux disease)    Hypertension    Hypertensive retinopathy    Hypothyroidism    Painful orthopaedic hardware (HCC)    PVC (premature ventricular contraction)    PVC's (premature ventricular contractions)    Smoker    former  " quit about 40 years ago"   Syncope    Vitreous hemorrhage of left eye (HCC)    Wears glasses    Past Surgical History:  Procedure Laterality Date   CARDIAC CATHETERIZATION  2007   neg   CATARACT EXTRACTION     CATARACT EXTRACTION W/ INTRAOCULAR LENS  IMPLANT, BILATERAL     CESAREAN SECTION     DUPUYTREN CONTRACTURE RELEASE Left 07/20/2021   Procedure: DUPUYTREN CONTRACTURE RELEASE, left small finger;  Surgeon: Gomez Cleverly, MD;  Location: [facility] [place];  Service: Orthopedics;  Laterality: Left;   EYE SURGERY     HARDWARE REMOVAL Left 03/19/2020   Procedure: HARDWARE REMOVAL LEFT ANKLE;  Surgeon: Roby Lofts, MD;  Location: [facility];  Service: Orthopedics;  Laterality: Left;   ORIF ANKLE FRACTURE Left 07/19/2019   Procedure: OPEN REDUCTION INTERNAL FIXATION (ORIF) ANKLE FRACTURE;  Surgeon: Roby Lofts, MD;  Location: [facility] OR;  Service: Orthopedics;  Laterality: Left;   FAMILY HISTORY Family History  Problem Relation Age of Onset   Coronary artery disease Mother    Kidney disease Mother    Diabetes Mother    Heart disease Mother        heart attack   Hypertension Mother    Macular degeneration Father    Diabetes Sister    Diabetes Maternal Grandmother    Diabetes Brother    Cancer Brother        lung cancer   Cancer Brother 68       lungs, brain, chest wall, liver cancer, stage 4   Cancer Other 70       lung , brain   Breast cancer Niece    SOCIAL HISTORY Social History   Tobacco Use   Smoking status: Former    Current packs/day: 0.00    Types:  Cigarettes    Quit date: 07/12/1981    Years since quitting: 41.5   Smokeless tobacco: Never  Vaping Use   Vaping status: Never Used  Substance Use Topics   Alcohol use: Not Currently    Alcohol/week: 0.0 standard drinks of alcohol    Comment: very rare   Drug use: No       OPHTHALMIC EXAM:  Base Eye Exam     Visual Acuity (Snellen - Linear)       Right  Left   Dist cc 20/100 -2 20/30 -2   Dist ph cc 20/25 -2 20/25 -3    Correction: Glasses         Tonometry (Tonopen, 8:03 AM)       Right Left   Pressure 16 14         Pupils       Pupils Dark Light Shape React APD   Right PERRL 4 3 Round Brisk None   Left PERRL 4 3 Round Brisk None         Visual Fields (Counting fingers)       Left Right    Full Full         Extraocular Movement       Right Left    Full, Ortho Full, Ortho         Neuro/Psych     Oriented x3: Yes   Mood/Affect: Normal         Dilation     Both eyes: 1.0% Mydriacyl, 2.5% Phenylephrine @ 8:04 AM           Slit Lamp and Fundus Exam     Slit Lamp Exam       Right Left   Lids/Lashes Dermatochalasis - upper lid, Meibomian gland dysfunction Dermatochalasis - upper lid, Meibomian gland dysfunction   Conjunctiva/Sclera White and quiet temporal pinguecula   Cornea mild arcus, +EBMD, trace tear film debris, trace PEE, well healed cataract wound Mild arcus, EBMD, well healed cataract wound, trace PEE   Anterior Chamber deep, clear, narrow temporal angle Deep and clear   Iris Round and dilated Round and dilated, No NVI   Lens PC IOL in good position, trace PCO PC IOL in good position, trace PCO   Anterior Vitreous Vitreous syneresis, Posterior vitreous detachment Vitreous syneresis, PVD, old white blood stained VH settled inferiorly         Fundus Exam       Right Left   Disc mild Pallor, Sharp rim Mild, temporal pallor, sharp rim, PPP   C/D Ratio 0.2 0.2   Macula Flat, Good foveal reflex, RPE mottling, No heme or  edema Flat, good foveal reflex, mild RPE mottling, rare MA, No edema   Vessels attenuated, mild tortuosity attenuated, mild tortuosity, no NV   Periphery Attached, scattered MA attached, scattered MA/DBH 360, no RT/RD, 360 PRP with room for fill in           Refraction     Wearing Rx       Sphere Cylinder Axis Add   Right -0.50 +0.50 153 +2.50   Left -0.25 +1.50 160 +2.50    Type: PAL            IMAGING AND PROCEDURES  Imaging and Procedures for 01/26/2023  OCT, Retina - OU - Both Eyes       Right Eye Quality was good. Central Foveal Thickness: 275. Progression has been stable. Findings include normal foveal contour, no IRF, no SRF (Trace ERM).   Left Eye Quality was borderline. Central Foveal Thickness: 293. Progression has been stable. Findings include normal foveal contour, no IRF, no SRF (Trace ERM, stable improvement in vitreous opacities, trace cystic changes inferior midzone caught on widefield -- stable).   Notes *Images captured and stored on drive  Diagnosis / Impression:  OD: Trace ERM OS: Trace ERM, stable improvement in vitreous opacities, trace cystic changes inferior midzone caught on widefield -- stable  Clinical management:  See below  Abbreviations: NFP -  Normal foveal profile. CME - cystoid macular edema. PED - pigment epithelial detachment. IRF - intraretinal fluid. SRF - subretinal fluid. EZ - ellipsoid zone. ERM - epiretinal membrane. ORA - outer retinal atrophy. ORT - outer retinal tubulation. SRHM - subretinal hyper-reflective material. IRHM - intraretinal hyper-reflective material      Fluorescein Angiography Optos (Transit OS)       Right Eye Progression has been stable. Early phase findings include microaneurysm. Mid/Late phase findings include leakage, microaneurysm, vascular perfusion defect (Mild leakage from scattered MA's and mild late perivascular leakage temporal periphery, no NV).   Left Eye Progression has been stable.  Early phase findings include staining, microaneurysm, vascular perfusion defect (Focal NV inf nasal peripheral midzone -- regressed, good 360 PRP laser, scattered MA). Mid/Late phase findings include staining, microaneurysm, vascular perfusion defect (No NV).   Notes **Images stored on drive**  Impression: Moderate NPDR OD Proliferative diabetic retinopathy s/p PRP OS  Late leaking MA's OU No NV OU            ASSESSMENT/PLAN:    ICD-10-CM   1. Vitreous hemorrhage of left eye (HCC)  H43.12 OCT, Retina - OU - Both Eyes    2. Proliferative diabetic retinopathy of left eye without macular edema associated with type 2 diabetes mellitus (HCC)  E33.2951 Fluorescein Angiography Optos (Transit OS)    3. Moderate nonproliferative diabetic retinopathy of right eye without macular edema associated with type 2 diabetes mellitus (HCC)  O84.1660     4. Long term (current) use of oral hypoglycemic drugs  Z79.84     5. Essential hypertension  I10     6. Hypertensive retinopathy of both eyes  H35.033 Fluorescein Angiography Optos (Transit OS)    7. Pseudophakia, both eyes  Z96.1     8. Dry eye syndrome of both eyes  H04.123     9. 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 Vitreous Hemorrhage OS -- recurrent onset ~07/23/22 -- PDR OS  - original onset of VH overnight and noted upon waking (06.22.22)  - recurrence on 01.12.24  - s/p IVA OS #1 (06.22.22), #2 (07.27.22), #3 (09.14.22), #4 (01.16.24), #5 (02.21.24)  - s/p PRP OS (01.18.24)  - b-scan 6.29.22 without RT/RD or mass  - exam shows interval improvement in VH - OCT shows Trace ERM, stable improvement in vitreous opacities, trace cystic changes SN mac and inferior midzone caught on widefield -- stable  - BCVA 20/25 OS - FA (8.24.22) shows interval improvement in blockage/ VH compared to 06.22.22 FA; no NV OU - repeat FA (01.16.24) shows new focal NV inf nasal peripheral midzone -- likely etiology of  VH - repeat FA (07.17.24) shows no NV -- regressed post-PRP - recommend holding injection today - pt in agreement - IVA consent was obtained and signed 01.16.24 (OS) - f/u 9 months -- DFE/OCT, possible injection   3,4. Moderate nonproliferative diabetic retinopathy w/o DME OD - FA 06.22.22, 08.24.22 and 01.16.24-- no NV OD, just leaking MA and mild peripheral perivascular leakage - OCT without diabetic macular edema, both eyes   - continue to monitor  5,6. Hypertensive retinopathy OU - discussed importance of tight BP control - continue to monitor  7. Pseudophakia OU  - s/p CE/IOL OU  - IOL's in good position, doing well  - mild PCO OU - pt inquiring about YAG laser  - continue to monitor  8,9. Dry eyes  w/ ABMD OU - recommend artificial tears  and lubricating ointment as needed  - dryness is improved today - cont Systane Gel OU QHS - continue Restasis OU BID as instructed by Dr. Bascom Levels  Ophthalmic Meds Ordered this visit:  No orders of the defined types were placed in this encounter.    Return in about 9 months (around 10/27/2023) for f/u VH OS / PDR OS, DFE, OCT.  There are no Patient Instructions on file for this visit.  This document serves as a record of services personally performed by Karie Chimera, MD, PhD. It was created on their behalf by De Blanch, an ophthalmic technician. The creation of this record is the provider's dictation and/or activities during the visit.    Electronically signed by: De Blanch, OA, 01/26/23  5:32 PM  This document serves as a record of services personally performed by Karie Chimera, MD, PhD. It was created on their behalf by Glee Arvin. Manson Passey, OA an ophthalmic technician. The creation of this record is the provider's dictation and/or activities during the visit.    Electronically signed by: Glee Arvin. Manson Passey, OA 01/26/23 5:32 PM  Karie Chimera, M.D., Ph.D. Diseases & Surgery of the Retina and Vitreous [facility]  I have reviewed the above documentation for accuracy and completeness, and I agree with the above. Karie Chimera, M.D., Ph.D. 01/26/23 5:33 PM   Abbreviations: M myopia (nearsighted); A astigmatism; H hyperopia (farsighted); P presbyopia; Mrx spectacle prescription;  CTL contact lenses; OD right eye; OS left eye; OU both eyes  XT exotropia; ET esotropia; PEK punctate epithelial keratitis; PEE punctate epithelial erosions; DES dry eye syndrome; MGD meibomian gland dysfunction; ATs artificial tears; PFAT's preservative free artificial tears; NSC nuclear sclerotic cataract; PSC posterior subcapsular cataract; ERM epi-retinal membrane; PVD posterior vitreous detachment; RD retinal detachment; DM diabetes mellitus; DR diabetic retinopathy; NPDR non-proliferative diabetic retinopathy; PDR proliferative diabetic retinopathy; CSME clinically significant macular edema; DME diabetic macular edema; dbh dot blot hemorrhages; CWS cotton wool spot; POAG primary open angle glaucoma; C/D cup-to-disc ratio; HVF humphrey visual field; GVF goldmann visual field; OCT optical coherence tomography; IOP intraocular pressure; BRVO Branch retinal vein occlusion; CRVO central retinal vein occlusion; CRAO central retinal artery occlusion; BRAO branch retinal artery occlusion; RT retinal tear; SB scleral buckle; PPV pars plana vitrectomy; VH Vitreous hemorrhage; PRP panretinal laser photocoagulation; IVK intravitreal kenalog; VMT vitreomacular traction; MH Macular hole;  NVD neovascularization of the disc; NVE neovascularization elsewhere; AREDS age related eye disease study; ARMD age related macular degeneration; POAG primary open angle glaucoma; EBMD epithelial/anterior basement membrane dystrophy; ACIOL anterior chamber intraocular lens; IOL intraocular lens; PCIOL posterior chamber intraocular lens; Phaco/IOL phacoemulsification with intraocular lens placement; PRK photorefractive keratectomy; LASIK laser  assisted in situ keratomileusis; HTN hypertension; DM diabetes mellitus; COPD chronic obstructive pulmonary disease

## 2023-01-26 ENCOUNTER — Ambulatory Visit (INDEPENDENT_AMBULATORY_CARE_PROVIDER_SITE_OTHER): Payer: Medicare Other | Admitting: Ophthalmology

## 2023-01-26 ENCOUNTER — Encounter (INDEPENDENT_AMBULATORY_CARE_PROVIDER_SITE_OTHER): Payer: Self-pay | Admitting: Ophthalmology

## 2023-01-26 DIAGNOSIS — H35033 Hypertensive retinopathy, bilateral: Secondary | ICD-10-CM

## 2023-01-26 DIAGNOSIS — H4312 Vitreous hemorrhage, left eye: Secondary | ICD-10-CM | POA: Diagnosis not present

## 2023-01-26 DIAGNOSIS — E113592 Type 2 diabetes mellitus with proliferative diabetic retinopathy without macular edema, left eye: Secondary | ICD-10-CM

## 2023-01-26 DIAGNOSIS — H18523 Epithelial (juvenile) corneal dystrophy, bilateral: Secondary | ICD-10-CM | POA: Diagnosis not present

## 2023-01-26 DIAGNOSIS — I10 Essential (primary) hypertension: Secondary | ICD-10-CM

## 2023-01-26 DIAGNOSIS — Z961 Presence of intraocular lens: Secondary | ICD-10-CM | POA: Diagnosis not present

## 2023-01-26 DIAGNOSIS — H04123 Dry eye syndrome of bilateral lacrimal glands: Secondary | ICD-10-CM | POA: Diagnosis not present

## 2023-01-26 DIAGNOSIS — E113391 Type 2 diabetes mellitus with moderate nonproliferative diabetic retinopathy without macular edema, right eye: Secondary | ICD-10-CM | POA: Diagnosis not present

## 2023-01-26 DIAGNOSIS — Z7984 Long term (current) use of oral hypoglycemic drugs: Secondary | ICD-10-CM | POA: Diagnosis not present

## 2023-01-27 DIAGNOSIS — E042 Nontoxic multinodular goiter: Secondary | ICD-10-CM | POA: Diagnosis not present

## 2023-01-27 DIAGNOSIS — I10 Essential (primary) hypertension: Secondary | ICD-10-CM | POA: Diagnosis not present

## 2023-01-27 DIAGNOSIS — E10319 Type 1 diabetes mellitus with unspecified diabetic retinopathy without macular edema: Secondary | ICD-10-CM | POA: Diagnosis not present

## 2023-01-27 DIAGNOSIS — K219 Gastro-esophageal reflux disease without esophagitis: Secondary | ICD-10-CM | POA: Diagnosis not present

## 2023-01-27 DIAGNOSIS — H3581 Retinal edema: Secondary | ICD-10-CM | POA: Diagnosis not present

## 2023-01-27 DIAGNOSIS — H4312 Vitreous hemorrhage, left eye: Secondary | ICD-10-CM | POA: Diagnosis not present

## 2023-01-27 DIAGNOSIS — E559 Vitamin D deficiency, unspecified: Secondary | ICD-10-CM | POA: Diagnosis not present

## 2023-01-27 DIAGNOSIS — E785 Hyperlipidemia, unspecified: Secondary | ICD-10-CM | POA: Diagnosis not present

## 2023-01-27 DIAGNOSIS — I493 Ventricular premature depolarization: Secondary | ICD-10-CM | POA: Diagnosis not present

## 2023-01-27 DIAGNOSIS — E039 Hypothyroidism, unspecified: Secondary | ICD-10-CM | POA: Diagnosis not present

## 2023-01-27 DIAGNOSIS — K760 Fatty (change of) liver, not elsewhere classified: Secondary | ICD-10-CM | POA: Diagnosis not present

## 2023-05-03 DIAGNOSIS — E10319 Type 1 diabetes mellitus with unspecified diabetic retinopathy without macular edema: Secondary | ICD-10-CM | POA: Diagnosis not present

## 2023-05-03 DIAGNOSIS — I10 Essential (primary) hypertension: Secondary | ICD-10-CM | POA: Diagnosis not present

## 2023-05-03 DIAGNOSIS — I493 Ventricular premature depolarization: Secondary | ICD-10-CM | POA: Diagnosis not present

## 2023-05-03 DIAGNOSIS — K760 Fatty (change of) liver, not elsewhere classified: Secondary | ICD-10-CM | POA: Diagnosis not present

## 2023-05-03 DIAGNOSIS — E042 Nontoxic multinodular goiter: Secondary | ICD-10-CM | POA: Diagnosis not present

## 2023-05-03 DIAGNOSIS — E039 Hypothyroidism, unspecified: Secondary | ICD-10-CM | POA: Diagnosis not present

## 2023-05-03 DIAGNOSIS — H4312 Vitreous hemorrhage, left eye: Secondary | ICD-10-CM | POA: Diagnosis not present

## 2023-05-03 DIAGNOSIS — Z23 Encounter for immunization: Secondary | ICD-10-CM | POA: Diagnosis not present

## 2023-05-03 DIAGNOSIS — E559 Vitamin D deficiency, unspecified: Secondary | ICD-10-CM | POA: Diagnosis not present

## 2023-05-03 DIAGNOSIS — E785 Hyperlipidemia, unspecified: Secondary | ICD-10-CM | POA: Diagnosis not present

## 2023-05-27 ENCOUNTER — Ambulatory Visit: Payer: Medicare Other | Admitting: Student

## 2023-06-28 ENCOUNTER — Other Ambulatory Visit (INDEPENDENT_AMBULATORY_CARE_PROVIDER_SITE_OTHER): Payer: Self-pay

## 2023-06-28 ENCOUNTER — Telehealth (INDEPENDENT_AMBULATORY_CARE_PROVIDER_SITE_OTHER): Payer: Self-pay

## 2023-06-28 MED ORDER — CYCLOSPORINE 0.05 % OP EMUL: 1.0000 [drp] | Freq: Two times a day (BID) | OPHTHALMIC | 3 refills | Status: AC

## 2023-07-26 DIAGNOSIS — Z1212 Encounter for screening for malignant neoplasm of rectum: Secondary | ICD-10-CM | POA: Diagnosis not present

## 2023-07-26 DIAGNOSIS — Z1211 Encounter for screening for malignant neoplasm of colon: Secondary | ICD-10-CM | POA: Diagnosis not present

## 2023-08-10 DIAGNOSIS — K219 Gastro-esophageal reflux disease without esophagitis: Secondary | ICD-10-CM | POA: Diagnosis not present

## 2023-08-10 DIAGNOSIS — E785 Hyperlipidemia, unspecified: Secondary | ICD-10-CM | POA: Diagnosis not present

## 2023-08-10 DIAGNOSIS — I493 Ventricular premature depolarization: Secondary | ICD-10-CM | POA: Diagnosis not present

## 2023-08-10 DIAGNOSIS — E039 Hypothyroidism, unspecified: Secondary | ICD-10-CM | POA: Diagnosis not present

## 2023-08-10 DIAGNOSIS — E042 Nontoxic multinodular goiter: Secondary | ICD-10-CM | POA: Diagnosis not present

## 2023-08-10 DIAGNOSIS — K760 Fatty (change of) liver, not elsewhere classified: Secondary | ICD-10-CM | POA: Diagnosis not present

## 2023-08-10 DIAGNOSIS — E559 Vitamin D deficiency, unspecified: Secondary | ICD-10-CM | POA: Diagnosis not present

## 2023-08-10 DIAGNOSIS — E10319 Type 1 diabetes mellitus with unspecified diabetic retinopathy without macular edema: Secondary | ICD-10-CM | POA: Diagnosis not present

## 2023-08-10 DIAGNOSIS — H4312 Vitreous hemorrhage, left eye: Secondary | ICD-10-CM | POA: Diagnosis not present

## 2023-08-10 DIAGNOSIS — I10 Essential (primary) hypertension: Secondary | ICD-10-CM | POA: Diagnosis not present

## 2023-10-20 NOTE — Progress Notes (Signed)
 [facility] - Clinic Note  10/26/2023     CHIEF COMPLAINT Patient presents for Retina Follow Up  HISTORY OF PRESENT ILLNESS: Jamie Mays is a 73 y.o. female who presents to the clinic today for:   HPI     Retina Follow Up   Patient presents with  Other.  In left eye.  This started 9 months ago.  I, the attending physician,  performed the HPI with the patient and updated documentation appropriately.        Comments   Patient here for 9 months retina follow up for VH OS. Patient states vision is doing. No eye pain. If does have pain only for a second or two. Uses Restasis BID OU.       Last edited by Ronelle Coffee, MD on 10/26/2023 12:34 PM.    Pt feels like she has a film over her vision in the right eye  Referring physician: Rosslyn Coons, MD [address] [place],  [place] [address]  HISTORICAL INFORMATION:  Selected notes from the MEDICAL RECORD NUMBER Referred by Dr. Lasandra Points for acute [facility] OS LEE:  Ocular Hx- PMH-    CURRENT MEDICATIONS: Current Outpatient Medications (Ophthalmic Drugs)  Medication Sig   cycloSPORINE (RESTASIS) 0.05 % ophthalmic emulsion Place 1 drop into both eyes 2 (two) times daily.   hydroxypropyl methylcellulose / hypromellose (ISOPTO TEARS / GONIOVISC) 2.5 % ophthalmic solution Place 1 drop into both eyes as needed for dry eyes.   Loteprednol Etabonate (LOTEMAX SM) 0.38 % GEL Place 1 drop into the left eye 4 (four) times daily.   XIIDRA 5 % SOLN Apply 1 drop to eye 2 (two) times daily.   No current facility-administered medications for this visit. (Ophthalmic Drugs)   Current Outpatient Medications (Other)  Medication Sig   calcium carbonate (TUMS - DOSED IN MG ELEMENTAL CALCIUM) 500 MG chewable tablet Chew 2 tablets by mouth daily as needed for indigestion or heartburn.   Famotidine (ZANTAC 360 PO) Take 360 mg by mouth daily as needed (heartburn).   glucose blood test strip 1 each by Other route 6 (six) times daily.  Use as instructed    irbesartan (AVAPRO) 150 MG tablet Take 150 mg by mouth daily.   levothyroxine (SYNTHROID, LEVOTHROID) 75 MCG tablet Take 1 tablet (75 mcg total) by mouth daily. (Patient taking differently: Take 75 mcg by mouth See admin instructions. Take 75 mg on Mon, Tue, Thurs, Fri, and Sat.  Skip Sun and Wed dose)   Magnesium 200 MG TABS Take 400 mg by mouth daily.   Omega-3 Fatty Acids (FISH OIL ADULT GUMMIES PO) Take 2 capsules by mouth daily.   pregabalin (LYRICA) 50 MG capsule Take 50 mg by mouth 2 (two) times daily. Patient taking once daily, unless traveling. Then patient takes twice daily.   propranolol (INDERAL) 20 MG tablet TAKE 0.5-1 TABLETS (10-20 MG TOTAL) BY MOUTH 3 (THREE) TIMES DAILY AS NEEDED (PALPITATIONS).   Turmeric 500 MG CAPS Take 500 mg by mouth daily.   Vitamin D, Ergocalciferol, (DRISDOL) 1.25 MG (50000 UT) CAPS capsule Take 50,000 Units by mouth every Tuesday.    insulin aspart (NOVOLOG) 100 UNIT/ML injection Inject 75-100 Units into the skin daily. Via insulin pump as directed   No current facility-administered medications for this visit. (Other)   REVIEW OF SYSTEMS: ROS   Positive for: Musculoskeletal, Endocrine, Eyes Negative for: Constitutional, Gastrointestinal, Neurological, Skin, Genitourinary, HENT, Cardiovascular, Respiratory, Psychiatric, Allergic/Imm, Heme/Lymph Last edited by Sylvan Evener,  COA on 10/26/2023  7:50 AM.     ALLERGIES Allergies  Allergen Reactions   Doxycycline Other (See Comments)    Joint pain   Lisinopril Cough   Minocycline Other (See Comments)    Dizziness    Oxycodone     Hallucinations    Prednisone Other (See Comments)    "Nausea and weakness"   PAST MEDICAL HISTORY Past Medical History:  Diagnosis Date   Ankle fracture    left   Arthritis    Diabetes mellitus    Diabetic retinopathy (HCC)    Family history of adverse reaction to anesthesia    " they had difficulty waking my dad up; he was about 85-74  years old;" neice had anaphylactic reaction after anesthesia   Family history of MI (myocardial infarction)    GERD (gastroesophageal reflux disease)    Hypertension    Hypertensive retinopathy    Hypothyroidism    Painful orthopaedic hardware (HCC)    PVC (premature ventricular contraction)    PVC's (premature ventricular contractions)    Smoker    former  " quit about 40 years ago"   Syncope    Vitreous hemorrhage of left eye (HCC)    Wears glasses    Past Surgical History:  Procedure Laterality Date   CARDIAC CATHETERIZATION  2007   neg   CATARACT EXTRACTION     CATARACT EXTRACTION W/ INTRAOCULAR LENS  IMPLANT, BILATERAL     CESAREAN SECTION     DUPUYTREN CONTRACTURE RELEASE Left 07/20/2021   Procedure: DUPUYTREN CONTRACTURE RELEASE, left small finger;  Surgeon: Ltanya Rummer, MD;  Location: [facility] [place];  Service: Orthopedics;  Laterality: Left;   EYE SURGERY     HARDWARE REMOVAL Left 03/19/2020   Procedure: HARDWARE REMOVAL LEFT ANKLE;  Surgeon: Laneta Pintos, MD;  Location: [facility] OR;  Service: Orthopedics;  Laterality: Left;   ORIF ANKLE FRACTURE Left 07/19/2019   Procedure: OPEN REDUCTION INTERNAL FIXATION (ORIF) ANKLE FRACTURE;  Surgeon: Laneta Pintos, MD;  Location: [facility] OR;  Service: Orthopedics;  Laterality: Left;   FAMILY HISTORY Family History  Problem Relation Age of Onset   Coronary artery disease Mother    Kidney disease Mother    Diabetes Mother    Heart disease Mother        heart attack   Hypertension Mother    Macular degeneration Father    Diabetes Sister    Diabetes Maternal Grandmother    Diabetes Brother    Cancer Brother        lung cancer   Cancer Brother 68       lungs, brain, chest wall, liver cancer, stage 4   Cancer Other 70       lung , brain   Breast cancer Niece    SOCIAL HISTORY Social History   Tobacco Use   Smoking status: Former    Current packs/day: 0.00    Types: Cigarettes    Quit date: 07/12/1981    Years  since quitting: 42.3   Smokeless tobacco: Never  Vaping Use   Vaping status: Never Used  Substance Use Topics   Alcohol use: Not Currently    Alcohol/week: 0.0 standard drinks of alcohol    Comment: very rare   Drug use: No       OPHTHALMIC EXAM:  Base Eye Exam     Visual Acuity (Snellen - Linear)       Right Left   Dist cc 20/50 -1 20/30 -1  Dist ph cc 20/30 -2 20/25    Correction: Glasses         Tonometry (Tonopen, 7:47 AM)       Right Left   Pressure 16 16         Pupils       Dark Light Shape React APD   Right 4 3 Round Brisk None   Left 4 3 Round Brisk None         Visual Fields (Counting fingers)       Left Right    Full Full         Extraocular Movement       Right Left    Full, Ortho Full, Ortho         Neuro/Psych     Oriented x3: Yes   Mood/Affect: Normal         Dilation     Both eyes: 1.0% Mydriacyl, 2.5% Phenylephrine @ 7:47 AM           Slit Lamp and Fundus Exam     Slit Lamp Exam       Right Left   Lids/Lashes Dermatochalasis - upper lid, Meibomian gland dysfunction Dermatochalasis - upper lid   Conjunctiva/Sclera White and quiet temporal pinguecula   Cornea mild arcus, +EBMD, trace tear film debris, trace PEE, well healed cataract wound Mild arcus, EBMD, well healed cataract wound, trace PEE   Anterior Chamber deep, clear, narrow temporal angle Deep and clear   Iris Round and dilated Round and dilated, No NVI   Lens PC IOL in good position, trace PCO SN PC IOL in good position, 1+PCO non-central   Anterior Vitreous mild syneresis, Posterior vitreous detachment syneresis, PVD, old white blood stained VH settled inferiorly         Fundus Exam       Right Left   Disc mild Pallor, Sharp rim Mild temporal pallor, sharp rim, PPP   C/D Ratio 0.2 0.2   Macula Flat, Good foveal reflex, RPE mottling, No heme or edema Flat, good foveal reflex, mild RPE mottling, rare MA, No edema   Vessels attenuated, mild  tortuosity attenuated, mild tortuosity, no NV   Periphery Attached, rare MA / DBH inferiorly attached, scattered MA/DBH 360, no RT/RD, 360 PRP with room for fill in           Refraction     Wearing Rx       Sphere Cylinder Axis Add   Right -0.50 +0.50 153 +2.50   Left -0.25 +1.50 160 +2.50    Type: PAL            IMAGING AND PROCEDURES  Imaging and Procedures for 10/26/2023  OCT, Retina - OU - Both Eyes       Right Eye Quality was good. Central Foveal Thickness: 283. Progression has been stable. Findings include normal foveal contour, no IRF, no SRF (Trace ERM).   Left Eye Quality was good. Central Foveal Thickness: 282. Progression has improved. Findings include normal foveal contour, no IRF, no SRF (Trace ERM, stable improvement in vitreous opacities, trace cystic changes inferior midzone caught on widefield -- improved).   Notes *Images captured and stored on drive  Diagnosis / Impression:  OD: Trace ERM OS: Trace ERM, stable improvement in vitreous opacities, trace cystic changes inferior midzone caught on widefield -- improved  Clinical management:  See below  Abbreviations: NFP - Normal foveal profile. CME - cystoid macular edema. PED - pigment epithelial detachment. IRF - intraretinal  fluid. SRF - subretinal fluid. EZ - ellipsoid zone. ERM - epiretinal membrane. ORA - outer retinal atrophy. ORT - outer retinal tubulation. SRHM - subretinal hyper-reflective material. IRHM - intraretinal hyper-reflective material            ASSESSMENT/PLAN:    ICD-10-CM   1. Vitreous hemorrhage of left eye (HCC)  H43.12     2. Proliferative diabetic retinopathy of left eye without macular edema associated with type 2 diabetes mellitus (HCC)  E11.3592 OCT, Retina - OU - Both Eyes    3. Moderate nonproliferative diabetic retinopathy of right eye without macular edema associated with type 2 diabetes mellitus (HCC)  Z61.0960     4. Long term (current) use of oral  hypoglycemic drugs  Z79.84     5. Essential hypertension  I10     6. Hypertensive retinopathy of both eyes  H35.033     7. Pseudophakia, both eyes  Z96.1     8. Dry eye syndrome of both eyes  H04.123     9. 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 Vitreous Hemorrhage OS -- recurrent onset ~07/23/22 -- PDR OS  - original onset of VH overnight and noted upon waking (06.22.22)  - recurrence on 01.12.24  - s/p IVA OS #1 (06.22.22), #2 (07.27.22), #3 (09.14.22), #4 (01.16.24), #5 (02.21.24)  - s/p PRP OS (01.18.24)  - b-scan 6.29.22 without RT/RD or mass  - exam shows interval improvement in VH - OCT shows Trace ERM, stable improvement in vitreous opacities, trace cystic changes SN mac and inferior midzone caught on widefield -- stable  - BCVA 20/25 OS - FA (8.24.22) shows interval improvement in blockage/ VH compared to 06.22.22 FA; no NV OU - repeat FA (01.16.24) shows new focal NV inf nasal peripheral midzone -- likely etiology of VH - repeat FA (07.17.24) shows no NV -- regressed post-PRP - recommend holding injection today - pt in agreement - IVA consent was obtained and signed 01.16.24 (OS) - f/u 1 year -- DFE/OCT, possible injection   3,4. Moderate nonproliferative diabetic retinopathy w/o DME OD  - A1c: 6.2 on 01.29.25 - FA 06.22.22, 08.24.22 and 01.16.24-- no NV OD, just leaking MA and mild peripheral perivascular leakage - OCT without diabetic macular edema, both eyes   - continue to monitor  5,6. Hypertensive retinopathy OU - discussed importance of tight BP control - continue to monitor  7. Pseudophakia OU  - s/p CE/IOL OU  - IOL's in good position, doing well  - mild PCO OU - pt inquiring about YAG laser  - continue to monitor  8,9. Dry eyes  w/ ABMD OU - recommend artificial tears and lubricating ointment as needed  - previously managed at [facility], but no longer in insurance network - recommend cornea eval w/ Dr.  Faylene Hoots at [facility] - cont Systane Gel OU QHS - continue Restasis OU BID  Ophthalmic Meds Ordered this visit:  No orders of the defined types were placed in this encounter.    Return in about 1 year (around 10/25/2024) for f/u PDR OS, DFE, OCT.  There are no Patient Instructions on file for this visit.  This document serves as a record of services personally performed by Jeanice Millard, MD, PhD. It was created on their behalf by Morley Arabia. Bevin Bucks, OA an ophthalmic technician. The creation of this record is the provider's dictation and/or activities during the visit.    Electronically signed  by: Morley Arabia Bevin Bucks, OA 10/26/23 12:35 PM  Jeanice Millard, M.D., Ph.D. Diseases & Surgery of the Retina and Vitreous [facility]  I have reviewed the above documentation for accuracy and completeness, and I agree with the above. Jeanice Millard, M.D., Ph.D. 10/26/23 12:38 PM   Abbreviations: M myopia (nearsighted); A astigmatism; H hyperopia (farsighted); P presbyopia; Mrx spectacle prescription;  CTL contact lenses; OD right eye; OS left eye; OU both eyes  XT exotropia; ET esotropia; PEK punctate epithelial keratitis; PEE punctate epithelial erosions; DES dry eye syndrome; MGD meibomian gland dysfunction; ATs artificial tears; PFAT's preservative free artificial tears; NSC nuclear sclerotic cataract; PSC posterior subcapsular cataract; ERM epi-retinal membrane; PVD posterior vitreous detachment; RD retinal detachment; DM diabetes mellitus; DR diabetic retinopathy; NPDR non-proliferative diabetic retinopathy; PDR proliferative diabetic retinopathy; CSME clinically significant macular edema; DME diabetic macular edema; dbh dot blot hemorrhages; CWS cotton wool spot; POAG primary open angle glaucoma; C/D cup-to-disc ratio; HVF humphrey visual field; GVF goldmann visual field; OCT optical coherence tomography; IOP intraocular pressure; BRVO Branch retinal vein occlusion; CRVO central  retinal vein occlusion; CRAO central retinal artery occlusion; BRAO branch retinal artery occlusion; RT retinal tear; SB scleral buckle; PPV pars plana vitrectomy; VH Vitreous hemorrhage; PRP panretinal laser photocoagulation; IVK intravitreal kenalog; VMT vitreomacular traction; MH Macular hole;  NVD neovascularization of the disc; NVE neovascularization elsewhere; AREDS age related eye disease study; ARMD age related macular degeneration; POAG primary open angle glaucoma; EBMD epithelial/anterior basement membrane dystrophy; ACIOL anterior chamber intraocular lens; IOL intraocular lens; PCIOL posterior chamber intraocular lens; Phaco/IOL phacoemulsification with intraocular lens placement; PRK photorefractive keratectomy; LASIK laser assisted in situ keratomileusis; HTN hypertension; DM diabetes mellitus; COPD chronic obstructive pulmonary disease

## 2023-10-24 DIAGNOSIS — Z1212 Encounter for screening for malignant neoplasm of rectum: Secondary | ICD-10-CM | POA: Diagnosis not present

## 2023-10-24 DIAGNOSIS — I10 Essential (primary) hypertension: Secondary | ICD-10-CM | POA: Diagnosis not present

## 2023-10-24 DIAGNOSIS — E039 Hypothyroidism, unspecified: Secondary | ICD-10-CM | POA: Diagnosis not present

## 2023-10-24 DIAGNOSIS — E559 Vitamin D deficiency, unspecified: Secondary | ICD-10-CM | POA: Diagnosis not present

## 2023-10-24 DIAGNOSIS — E042 Nontoxic multinodular goiter: Secondary | ICD-10-CM | POA: Diagnosis not present

## 2023-10-24 DIAGNOSIS — E10319 Type 1 diabetes mellitus with unspecified diabetic retinopathy without macular edema: Secondary | ICD-10-CM | POA: Diagnosis not present

## 2023-10-24 DIAGNOSIS — E785 Hyperlipidemia, unspecified: Secondary | ICD-10-CM | POA: Diagnosis not present

## 2023-10-26 ENCOUNTER — Ambulatory Visit (INDEPENDENT_AMBULATORY_CARE_PROVIDER_SITE_OTHER): Payer: Medicare Other | Admitting: Ophthalmology

## 2023-10-26 ENCOUNTER — Encounter (INDEPENDENT_AMBULATORY_CARE_PROVIDER_SITE_OTHER): Payer: Self-pay | Admitting: Ophthalmology

## 2023-10-26 DIAGNOSIS — Z7984 Long term (current) use of oral hypoglycemic drugs: Secondary | ICD-10-CM

## 2023-10-26 DIAGNOSIS — H4312 Vitreous hemorrhage, left eye: Secondary | ICD-10-CM | POA: Diagnosis not present

## 2023-10-26 DIAGNOSIS — H04123 Dry eye syndrome of bilateral lacrimal glands: Secondary | ICD-10-CM | POA: Diagnosis not present

## 2023-10-26 DIAGNOSIS — Z961 Presence of intraocular lens: Secondary | ICD-10-CM | POA: Diagnosis not present

## 2023-10-26 DIAGNOSIS — I10 Essential (primary) hypertension: Secondary | ICD-10-CM | POA: Diagnosis not present

## 2023-10-26 DIAGNOSIS — E113592 Type 2 diabetes mellitus with proliferative diabetic retinopathy without macular edema, left eye: Secondary | ICD-10-CM | POA: Diagnosis not present

## 2023-10-26 DIAGNOSIS — H35033 Hypertensive retinopathy, bilateral: Secondary | ICD-10-CM | POA: Diagnosis not present

## 2023-10-26 DIAGNOSIS — E113391 Type 2 diabetes mellitus with moderate nonproliferative diabetic retinopathy without macular edema, right eye: Secondary | ICD-10-CM | POA: Diagnosis not present

## 2023-10-26 DIAGNOSIS — H18523 Epithelial (juvenile) corneal dystrophy, bilateral: Secondary | ICD-10-CM | POA: Diagnosis not present

## 2023-10-31 DIAGNOSIS — I10 Essential (primary) hypertension: Secondary | ICD-10-CM | POA: Diagnosis not present

## 2023-10-31 DIAGNOSIS — H4312 Vitreous hemorrhage, left eye: Secondary | ICD-10-CM | POA: Diagnosis not present

## 2023-10-31 DIAGNOSIS — K760 Fatty (change of) liver, not elsewhere classified: Secondary | ICD-10-CM | POA: Diagnosis not present

## 2023-10-31 DIAGNOSIS — E039 Hypothyroidism, unspecified: Secondary | ICD-10-CM | POA: Diagnosis not present

## 2023-10-31 DIAGNOSIS — E785 Hyperlipidemia, unspecified: Secondary | ICD-10-CM | POA: Diagnosis not present

## 2023-10-31 DIAGNOSIS — Z0000 Encounter for general adult medical examination without abnormal findings: Secondary | ICD-10-CM | POA: Diagnosis not present

## 2023-10-31 DIAGNOSIS — Z1339 Encounter for screening examination for other mental health and behavioral disorders: Secondary | ICD-10-CM | POA: Diagnosis not present

## 2023-10-31 DIAGNOSIS — E10319 Type 1 diabetes mellitus with unspecified diabetic retinopathy without macular edema: Secondary | ICD-10-CM | POA: Diagnosis not present

## 2023-10-31 DIAGNOSIS — Z1331 Encounter for screening for depression: Secondary | ICD-10-CM | POA: Diagnosis not present

## 2023-10-31 DIAGNOSIS — K219 Gastro-esophageal reflux disease without esophagitis: Secondary | ICD-10-CM | POA: Diagnosis not present

## 2023-10-31 DIAGNOSIS — R82998 Other abnormal findings in urine: Secondary | ICD-10-CM | POA: Diagnosis not present

## 2023-10-31 DIAGNOSIS — I493 Ventricular premature depolarization: Secondary | ICD-10-CM | POA: Diagnosis not present

## 2023-10-31 DIAGNOSIS — E042 Nontoxic multinodular goiter: Secondary | ICD-10-CM | POA: Diagnosis not present

## 2023-12-01 DIAGNOSIS — I10 Essential (primary) hypertension: Secondary | ICD-10-CM | POA: Diagnosis not present

## 2023-12-01 DIAGNOSIS — Z794 Long term (current) use of insulin: Secondary | ICD-10-CM | POA: Diagnosis not present

## 2023-12-01 DIAGNOSIS — E785 Hyperlipidemia, unspecified: Secondary | ICD-10-CM | POA: Diagnosis not present

## 2023-12-01 DIAGNOSIS — Z4681 Encounter for fitting and adjustment of insulin pump: Secondary | ICD-10-CM | POA: Diagnosis not present

## 2023-12-01 DIAGNOSIS — E10319 Type 1 diabetes mellitus with unspecified diabetic retinopathy without macular edema: Secondary | ICD-10-CM | POA: Diagnosis not present

## 2023-12-01 DIAGNOSIS — K760 Fatty (change of) liver, not elsewhere classified: Secondary | ICD-10-CM | POA: Diagnosis not present

## 2023-12-06 ENCOUNTER — Other Ambulatory Visit: Payer: Self-pay | Admitting: Endocrinology

## 2023-12-06 DIAGNOSIS — Z1231 Encounter for screening mammogram for malignant neoplasm of breast: Secondary | ICD-10-CM

## 2023-12-12 ENCOUNTER — Ambulatory Visit
Admission: RE | Admit: 2023-12-12 | Discharge: 2023-12-12 | Disposition: A | Discharge status: Home / Self Care | Source: Ambulatory Visit | Attending: Endocrinology | Admitting: Endocrinology

## 2023-12-12 DIAGNOSIS — Z1231 Encounter for screening mammogram for malignant neoplasm of breast: Secondary | ICD-10-CM

## 2023-12-19 DIAGNOSIS — I10 Essential (primary) hypertension: Secondary | ICD-10-CM | POA: Diagnosis not present

## 2023-12-19 DIAGNOSIS — E785 Hyperlipidemia, unspecified: Secondary | ICD-10-CM | POA: Diagnosis not present

## 2023-12-19 DIAGNOSIS — Z4681 Encounter for fitting and adjustment of insulin pump: Secondary | ICD-10-CM | POA: Diagnosis not present

## 2023-12-19 DIAGNOSIS — Z794 Long term (current) use of insulin: Secondary | ICD-10-CM | POA: Diagnosis not present

## 2023-12-19 DIAGNOSIS — E10319 Type 1 diabetes mellitus with unspecified diabetic retinopathy without macular edema: Secondary | ICD-10-CM | POA: Diagnosis not present

## 2024-01-04 DIAGNOSIS — Z794 Long term (current) use of insulin: Secondary | ICD-10-CM | POA: Diagnosis not present

## 2024-01-04 DIAGNOSIS — E10319 Type 1 diabetes mellitus with unspecified diabetic retinopathy without macular edema: Secondary | ICD-10-CM | POA: Diagnosis not present

## 2024-01-04 DIAGNOSIS — E785 Hyperlipidemia, unspecified: Secondary | ICD-10-CM | POA: Diagnosis not present

## 2024-01-04 DIAGNOSIS — I10 Essential (primary) hypertension: Secondary | ICD-10-CM | POA: Diagnosis not present

## 2024-01-04 DIAGNOSIS — K760 Fatty (change of) liver, not elsewhere classified: Secondary | ICD-10-CM | POA: Diagnosis not present

## 2024-01-04 DIAGNOSIS — Z4681 Encounter for fitting and adjustment of insulin pump: Secondary | ICD-10-CM | POA: Diagnosis not present

## 2024-01-16 ENCOUNTER — Ambulatory Visit
Admission: RE | Admit: 2024-01-16 | Discharge: 2024-01-16 | Disposition: A | Discharge status: Home / Self Care | Source: Ambulatory Visit | Attending: Endocrinology | Admitting: Endocrinology

## 2024-01-16 DIAGNOSIS — Z1231 Encounter for screening mammogram for malignant neoplasm of breast: Secondary | ICD-10-CM | POA: Diagnosis not present

## 2024-01-19 DIAGNOSIS — K760 Fatty (change of) liver, not elsewhere classified: Secondary | ICD-10-CM | POA: Diagnosis not present

## 2024-01-19 DIAGNOSIS — E785 Hyperlipidemia, unspecified: Secondary | ICD-10-CM | POA: Diagnosis not present

## 2024-01-19 DIAGNOSIS — I493 Ventricular premature depolarization: Secondary | ICD-10-CM | POA: Diagnosis not present

## 2024-01-19 DIAGNOSIS — E559 Vitamin D deficiency, unspecified: Secondary | ICD-10-CM | POA: Diagnosis not present

## 2024-01-19 DIAGNOSIS — Z4681 Encounter for fitting and adjustment of insulin pump: Secondary | ICD-10-CM | POA: Diagnosis not present

## 2024-01-19 DIAGNOSIS — E042 Nontoxic multinodular goiter: Secondary | ICD-10-CM | POA: Diagnosis not present

## 2024-01-19 DIAGNOSIS — E039 Hypothyroidism, unspecified: Secondary | ICD-10-CM | POA: Diagnosis not present

## 2024-01-19 DIAGNOSIS — K219 Gastro-esophageal reflux disease without esophagitis: Secondary | ICD-10-CM | POA: Diagnosis not present

## 2024-01-19 DIAGNOSIS — E10319 Type 1 diabetes mellitus with unspecified diabetic retinopathy without macular edema: Secondary | ICD-10-CM | POA: Diagnosis not present

## 2024-01-19 DIAGNOSIS — I10 Essential (primary) hypertension: Secondary | ICD-10-CM | POA: Diagnosis not present

## 2024-01-19 DIAGNOSIS — H4312 Vitreous hemorrhage, left eye: Secondary | ICD-10-CM | POA: Diagnosis not present

## 2024-03-19 DIAGNOSIS — H04123 Dry eye syndrome of bilateral lacrimal glands: Secondary | ICD-10-CM | POA: Diagnosis not present

## 2024-03-19 DIAGNOSIS — H52213 Irregular astigmatism, bilateral: Secondary | ICD-10-CM | POA: Diagnosis not present

## 2024-03-19 DIAGNOSIS — E11319 Type 2 diabetes mellitus with unspecified diabetic retinopathy without macular edema: Secondary | ICD-10-CM | POA: Diagnosis not present

## 2024-03-19 DIAGNOSIS — H35372 Puckering of macula, left eye: Secondary | ICD-10-CM | POA: Diagnosis not present

## 2024-03-19 DIAGNOSIS — E113592 Type 2 diabetes mellitus with proliferative diabetic retinopathy without macular edema, left eye: Secondary | ICD-10-CM | POA: Diagnosis not present

## 2024-03-19 DIAGNOSIS — H18593 Other hereditary corneal dystrophies, bilateral: Secondary | ICD-10-CM | POA: Diagnosis not present

## 2024-04-03 DIAGNOSIS — D225 Melanocytic nevi of trunk: Secondary | ICD-10-CM | POA: Diagnosis not present

## 2024-04-03 DIAGNOSIS — L718 Other rosacea: Secondary | ICD-10-CM | POA: Diagnosis not present

## 2024-04-03 DIAGNOSIS — D485 Neoplasm of uncertain behavior of skin: Secondary | ICD-10-CM | POA: Diagnosis not present

## 2024-04-03 DIAGNOSIS — Z1283 Encounter for screening for malignant neoplasm of skin: Secondary | ICD-10-CM | POA: Diagnosis not present

## 2024-04-18 DIAGNOSIS — D225 Melanocytic nevi of trunk: Secondary | ICD-10-CM | POA: Diagnosis not present

## 2024-04-18 DIAGNOSIS — L988 Other specified disorders of the skin and subcutaneous tissue: Secondary | ICD-10-CM | POA: Diagnosis not present

## 2024-04-18 DIAGNOSIS — D485 Neoplasm of uncertain behavior of skin: Secondary | ICD-10-CM | POA: Diagnosis not present

## 2024-04-25 DIAGNOSIS — H179 Unspecified corneal scar and opacity: Secondary | ICD-10-CM | POA: Diagnosis not present

## 2024-04-25 DIAGNOSIS — E039 Hypothyroidism, unspecified: Secondary | ICD-10-CM | POA: Diagnosis not present

## 2024-04-25 DIAGNOSIS — E042 Nontoxic multinodular goiter: Secondary | ICD-10-CM | POA: Diagnosis not present

## 2024-04-25 DIAGNOSIS — H4312 Vitreous hemorrhage, left eye: Secondary | ICD-10-CM | POA: Diagnosis not present

## 2024-04-25 DIAGNOSIS — R0989 Other specified symptoms and signs involving the circulatory and respiratory systems: Secondary | ICD-10-CM | POA: Diagnosis not present

## 2024-04-25 DIAGNOSIS — I10 Essential (primary) hypertension: Secondary | ICD-10-CM | POA: Diagnosis not present

## 2024-04-25 DIAGNOSIS — E10319 Type 1 diabetes mellitus with unspecified diabetic retinopathy without macular edema: Secondary | ICD-10-CM | POA: Diagnosis not present

## 2024-04-25 DIAGNOSIS — E785 Hyperlipidemia, unspecified: Secondary | ICD-10-CM | POA: Diagnosis not present

## 2024-04-25 DIAGNOSIS — R42 Dizziness and giddiness: Secondary | ICD-10-CM | POA: Diagnosis not present

## 2024-04-25 DIAGNOSIS — I493 Ventricular premature depolarization: Secondary | ICD-10-CM | POA: Diagnosis not present

## 2024-04-25 DIAGNOSIS — Z4681 Encounter for fitting and adjustment of insulin pump: Secondary | ICD-10-CM | POA: Diagnosis not present

## 2024-04-25 DIAGNOSIS — K760 Fatty (change of) liver, not elsewhere classified: Secondary | ICD-10-CM | POA: Diagnosis not present

## 2024-04-26 ENCOUNTER — Other Ambulatory Visit: Payer: Self-pay | Admitting: Endocrinology

## 2024-04-26 DIAGNOSIS — R0989 Other specified symptoms and signs involving the circulatory and respiratory systems: Secondary | ICD-10-CM

## 2024-05-01 ENCOUNTER — Ambulatory Visit
Admission: RE | Admit: 2024-05-01 | Discharge: 2024-05-01 | Disposition: A | Discharge status: Home / Self Care | Source: Ambulatory Visit | Attending: Endocrinology | Admitting: Endocrinology

## 2024-05-01 DIAGNOSIS — I10 Essential (primary) hypertension: Secondary | ICD-10-CM | POA: Diagnosis not present

## 2024-05-01 DIAGNOSIS — R0989 Other specified symptoms and signs involving the circulatory and respiratory systems: Secondary | ICD-10-CM

## 2024-05-21 DIAGNOSIS — Z23 Encounter for immunization: Secondary | ICD-10-CM | POA: Diagnosis not present

## 2024-08-01 ENCOUNTER — Other Ambulatory Visit: Payer: Self-pay | Admitting: Endocrinology

## 2024-08-01 DIAGNOSIS — E785 Hyperlipidemia, unspecified: Secondary | ICD-10-CM

## 2024-08-01 DIAGNOSIS — R0602 Shortness of breath: Secondary | ICD-10-CM

## 2024-08-08 ENCOUNTER — Inpatient Hospital Stay
Admission: RE | Admit: 2024-08-08 | Discharge: 2024-08-08 | Disposition: A | Source: Ambulatory Visit | Attending: Endocrinology | Admitting: Endocrinology

## 2024-08-08 DIAGNOSIS — R0602 Shortness of breath: Secondary | ICD-10-CM

## 2024-08-08 DIAGNOSIS — E785 Hyperlipidemia, unspecified: Secondary | ICD-10-CM

## 2024-08-09 ENCOUNTER — Other Ambulatory Visit: Payer: Self-pay | Admitting: Endocrinology

## 2024-08-09 DIAGNOSIS — R0602 Shortness of breath: Secondary | ICD-10-CM

## 2024-08-17 ENCOUNTER — Other Ambulatory Visit

## 2024-08-20 ENCOUNTER — Other Ambulatory Visit

## 2024-10-24 ENCOUNTER — Encounter (INDEPENDENT_AMBULATORY_CARE_PROVIDER_SITE_OTHER): Admitting: Ophthalmology
